# Patient Record
Sex: Female | Born: 1970 | Hispanic: No | Smoking: Never smoker
Health system: Southern US, Community
[De-identification: ages and names within clinical notes are randomized; demographics above are authoritative.]

## PROBLEM LIST (undated history)

## (undated) DIAGNOSIS — R42 Dizziness and giddiness: Secondary | ICD-10-CM

## (undated) DIAGNOSIS — E079 Disorder of thyroid, unspecified: Secondary | ICD-10-CM

## (undated) DIAGNOSIS — T7840XA Allergy, unspecified, initial encounter: Secondary | ICD-10-CM

## (undated) DIAGNOSIS — F32A Depression, unspecified: Secondary | ICD-10-CM

## (undated) DIAGNOSIS — K219 Gastro-esophageal reflux disease without esophagitis: Secondary | ICD-10-CM

## (undated) DIAGNOSIS — D649 Anemia, unspecified: Secondary | ICD-10-CM

## (undated) DIAGNOSIS — D259 Leiomyoma of uterus, unspecified: Secondary | ICD-10-CM

## (undated) DIAGNOSIS — E785 Hyperlipidemia, unspecified: Secondary | ICD-10-CM

## (undated) DIAGNOSIS — F419 Anxiety disorder, unspecified: Secondary | ICD-10-CM

## (undated) DIAGNOSIS — M1990 Unspecified osteoarthritis, unspecified site: Secondary | ICD-10-CM

## (undated) HISTORY — DX: Allergy, unspecified, initial encounter: T78.40XA

## (undated) HISTORY — DX: Gastro-esophageal reflux disease without esophagitis: K21.9

## (undated) HISTORY — DX: Anemia, unspecified: D64.9

## (undated) HISTORY — DX: Hyperlipidemia, unspecified: E78.5

## (undated) HISTORY — DX: Anxiety disorder, unspecified: F41.9

## (undated) HISTORY — DX: Dizziness and giddiness: R42

## (undated) HISTORY — PX: CHOLECYSTECTOMY: SHX55

## (undated) HISTORY — DX: Depression, unspecified: F32.A

## (undated) HISTORY — DX: Unspecified osteoarthritis, unspecified site: M19.90

---

## 2013-03-16 ENCOUNTER — Ambulatory Visit: Payer: Medicaid Other | Attending: Internal Medicine | Admitting: Internal Medicine

## 2013-03-16 ENCOUNTER — Encounter: Payer: Self-pay | Admitting: Internal Medicine

## 2013-03-16 VITALS — BP 122/70 | HR 89 | Temp 98.7°F | Resp 14 | Ht 60.0 in | Wt 158.2 lb

## 2013-03-16 DIAGNOSIS — Z0000 Encounter for general adult medical examination without abnormal findings: Secondary | ICD-10-CM

## 2013-03-16 DIAGNOSIS — E039 Hypothyroidism, unspecified: Secondary | ICD-10-CM

## 2013-03-16 LAB — CBC WITH DIFFERENTIAL/PLATELET
Basophils Absolute: 0.1 10*3/uL (ref 0.0–0.1)
Basophils Relative: 1 % (ref 0–1)
EOS ABS: 0.2 10*3/uL (ref 0.0–0.7)
Eosinophils Relative: 3 % (ref 0–5)
HCT: 39.4 % (ref 36.0–46.0)
Hemoglobin: 13.1 g/dL (ref 12.0–15.0)
Lymphocytes Relative: 33 % (ref 12–46)
Lymphs Abs: 1.9 10*3/uL (ref 0.7–4.0)
MCH: 28.2 pg (ref 26.0–34.0)
MCHC: 33.2 g/dL (ref 30.0–36.0)
MCV: 84.9 fL (ref 78.0–100.0)
Monocytes Absolute: 0.3 10*3/uL (ref 0.1–1.0)
Monocytes Relative: 5 % (ref 3–12)
NEUTROS ABS: 3.4 10*3/uL (ref 1.7–7.7)
NEUTROS PCT: 58 % (ref 43–77)
Platelets: 337 10*3/uL (ref 150–400)
RBC: 4.64 MIL/uL (ref 3.87–5.11)
RDW: 14.3 % (ref 11.5–15.5)
WBC: 5.9 10*3/uL (ref 4.0–10.5)

## 2013-03-16 LAB — COMPLETE METABOLIC PANEL WITH GFR
ALBUMIN: 4.2 g/dL (ref 3.5–5.2)
ALT: 13 U/L (ref 0–35)
AST: 14 U/L (ref 0–37)
Alkaline Phosphatase: 98 U/L (ref 39–117)
BUN: 13 mg/dL (ref 6–23)
CO2: 26 meq/L (ref 19–32)
Calcium: 9.7 mg/dL (ref 8.4–10.5)
Chloride: 104 mEq/L (ref 96–112)
Creat: 0.72 mg/dL (ref 0.50–1.10)
GLUCOSE: 75 mg/dL (ref 70–99)
POTASSIUM: 4.4 meq/L (ref 3.5–5.3)
SODIUM: 140 meq/L (ref 135–145)
TOTAL PROTEIN: 7.5 g/dL (ref 6.0–8.3)
Total Bilirubin: 0.2 mg/dL (ref 0.2–1.2)

## 2013-03-16 LAB — LIPID PANEL
Cholesterol: 207 mg/dL — ABNORMAL HIGH (ref 0–200)
HDL: 43 mg/dL (ref >39)
LDL Cholesterol: 122 mg/dL — ABNORMAL HIGH (ref 0–99)
TRIGLYCERIDES: 209 mg/dL — AB (ref <150)
Total CHOL/HDL Ratio: 4.8 Ratio
VLDL: 42 mg/dL — ABNORMAL HIGH (ref 0–40)

## 2013-03-16 MED ORDER — LEVOTHYROXINE SODIUM 100 MCG PO TABS: 100.0000 ug | ORAL_TABLET | Freq: Every day | ORAL | Status: DC

## 2013-03-16 NOTE — Progress Notes (Signed)
Patient ID: Kaitlyn Wise, female   DOB: May 10, 1970, 43 y.o.   MRN: [id number]   CC:  HPI: 43 year old female here to establish care. The patient recently moved from [place] 1-1/2 months ago. The patient has a history of hypothyroidism, was on Synthroid 100 mcg. She is requesting a prescription for the same She has a history of heavy menstrual periods and possibly has fibroids. She's never had a Pap smear She's never had a mammogram She status post cholecystectomy Social history nonsmoker nonalcoholic  Family history positive for hypertension in most family members   Not on File History reviewed. No pertinent past medical history. No current outpatient prescriptions on file prior to visit.   No current facility-administered medications on file prior to visit.   Family History  Problem Relation Age of Onset  . Diabetes Mother   . Hypertension Mother   . Diabetes Father   . Hypertension Father   . Heart disease Brother    History   Social History  . Marital Status: Single    Spouse Name: N/A    Number of Children: N/A  . Years of Education: N/A   Occupational History  . Not on file.   Social History Main Topics  . Smoking status: Never Smoker   . Smokeless tobacco: Not on file  . Alcohol Use: No  . Drug Use: No  . Sexual Activity: Not on file   Other Topics Concern  . Not on file   Social History Narrative  . No narrative on file    Review of Systems  Constitutional: Negative for fever, chills, diaphoresis, activity change, appetite change and fatigue.  HENT: Negative for ear pain, nosebleeds, congestion, facial swelling, rhinorrhea, neck pain, neck stiffness and ear discharge.   Eyes: Negative for pain, discharge, redness, itching and visual disturbance.  Respiratory: Negative for cough, choking, chest tightness, shortness of breath, wheezing and stridor.   Cardiovascular: Negative for chest pain, palpitations and leg swelling.  Gastrointestinal: Negative for  abdominal distention.  Genitourinary: Negative for dysuria, urgency, frequency, hematuria, flank pain, decreased urine volume, difficulty urinating and dyspareunia.  Musculoskeletal: Negative for back pain, joint swelling, arthralgias and gait problem.  Neurological: Negative for dizziness, tremors, seizures, syncope, facial asymmetry, speech difficulty, weakness, light-headedness, numbness and headaches.  Hematological: Negative for adenopathy. Does not bruise/bleed easily.  Psychiatric/Behavioral: Negative for hallucinations, behavioral problems, confusion, dysphoric mood, decreased concentration and agitation.    Objective:   Filed Vitals:   03/16/13 0938  BP: 122/70  Pulse: 89  Temp: 98.7 F (37.1 C)  Resp: 14    Physical Exam  Constitutional: Appears well-developed and well-nourished. No distress.  HENT: Normocephalic. External right and left ear normal. Oropharynx is clear and moist.  Eyes: Conjunctivae and EOM are normal. PERRLA, no scleral icterus.  Neck: Normal ROM. Neck supple. No JVD. No tracheal deviation. No thyromegaly.  CVS: RRR, S1/S2 +, no murmurs, no gallops, no carotid bruit.  Pulmonary: Effort and breath sounds normal, no stridor, rhonchi, wheezes, rales.  Abdominal: Soft. BS +,  no distension, tenderness, rebound or guarding.  Musculoskeletal: Normal range of motion. No edema and no tenderness.  Lymphadenopathy: No lymphadenopathy noted, cervical, inguinal. Neuro: Alert. Normal reflexes, muscle tone coordination. No cranial nerve deficit. Skin: Skin is warm and dry. No rash noted. Not diaphoretic. No erythema. No pallor.  Psychiatric: Normal mood and affect. Behavior, judgment, thought content normal.   No results found for this basename: WBC, HGB, HCT, MCV, PLT   No results found for  this basename: CREATININE, BUN, NA, K, CL, CO2    No results found for this basename: HGBA1C   Lipid Panel  No results found for this basename: chol, trig, hdl, cholhdl,  vldl, ldlcalc       Assessment and plan:   There are no active problems to display for this patient.  Hypothyroidism Will prescribe Synthroid 100 mcg TSH Free T4   Establish care Schedule the patient for a mammogram Baseline labs Gynecology referral for Pap smear and heavy menstrual bleeding Vaccination today Transvaginal ultrasound for heavy menstrual periods  The patient will follow up in 3 months      The patient was given clear instructions to go to ER or return to medical center if symptoms don't improve, worsen or new problems develop. The patient verbalized understanding. The patient was told to call to get any lab results if not heard anything in the next week.

## 2013-03-16 NOTE — Progress Notes (Signed)
Patient is here to establish care and a physical. Complains of lower back x5 years on and off. Pain occurs mostly from standing for a long period of time and reaching for items in high places. No pain today. No OTC medications. Patient is using the interpreter line. Patient needs a refill for medication and requests blood work to test thyroid level.

## 2013-03-17 LAB — T4, FREE: Free T4: 1.23 ng/dL (ref 0.80–1.80)

## 2013-03-17 LAB — TSH: TSH: 3.715 u[IU]/mL (ref 0.350–4.500)

## 2013-03-19 ENCOUNTER — Ambulatory Visit (HOSPITAL_COMMUNITY)
Admission: RE | Admit: 2013-03-19 | Discharge: 2013-03-19 | Disposition: A | Discharge status: Home / Self Care | Payer: Medicaid Other | Source: Ambulatory Visit | Attending: Internal Medicine | Admitting: Internal Medicine

## 2013-03-19 DIAGNOSIS — Z0000 Encounter for general adult medical examination without abnormal findings: Secondary | ICD-10-CM

## 2013-03-19 DIAGNOSIS — D251 Intramural leiomyoma of uterus: Secondary | ICD-10-CM | POA: Insufficient documentation

## 2013-03-19 DIAGNOSIS — N920 Excessive and frequent menstruation with regular cycle: Secondary | ICD-10-CM | POA: Insufficient documentation

## 2013-03-19 DIAGNOSIS — E039 Hypothyroidism, unspecified: Secondary | ICD-10-CM

## 2013-03-20 ENCOUNTER — Telehealth: Payer: Self-pay | Admitting: *Deleted

## 2013-03-20 NOTE — Telephone Encounter (Signed)
Contacted patient with an interpreter. Telephone number given is for a refugee case worker. Left a voicemail for patient to return our call.

## 2013-03-20 NOTE — Telephone Encounter (Signed)
Message copied by Dorethy Tomey, [place] R on Fri Mar 20, 2013  2:19 PM ------      Message from: Allyson Sabal MD, Ascencion Dike      Created: Fri Mar 20, 2013  9:59 AM       Notify patient of the labs are normal except triglycerides are 209 LDL 122, recommend low-fat diet and exercise ------

## 2013-03-31 ENCOUNTER — Other Ambulatory Visit: Payer: Self-pay | Admitting: Internal Medicine

## 2013-03-31 DIAGNOSIS — Z1231 Encounter for screening mammogram for malignant neoplasm of breast: Secondary | ICD-10-CM

## 2013-04-23 ENCOUNTER — Encounter: Payer: Medicaid Other | Admitting: Obstetrics & Gynecology

## 2013-05-17 ENCOUNTER — Encounter (HOSPITAL_COMMUNITY): Payer: Self-pay | Admitting: Emergency Medicine

## 2013-05-17 ENCOUNTER — Emergency Department (HOSPITAL_COMMUNITY)
Admission: EM | Admit: 2013-05-17 | Discharge: 2013-05-18 | Disposition: A | Discharge status: Home / Self Care | Payer: Medicaid Other | Attending: Emergency Medicine | Admitting: Emergency Medicine

## 2013-05-17 DIAGNOSIS — Z3202 Encounter for pregnancy test, result negative: Secondary | ICD-10-CM | POA: Insufficient documentation

## 2013-05-17 DIAGNOSIS — Z8742 Personal history of other diseases of the female genital tract: Secondary | ICD-10-CM | POA: Insufficient documentation

## 2013-05-17 DIAGNOSIS — R51 Headache: Secondary | ICD-10-CM | POA: Insufficient documentation

## 2013-05-17 DIAGNOSIS — R6883 Chills (without fever): Secondary | ICD-10-CM | POA: Insufficient documentation

## 2013-05-17 DIAGNOSIS — R42 Dizziness and giddiness: Secondary | ICD-10-CM | POA: Insufficient documentation

## 2013-05-17 DIAGNOSIS — T50A15A Adverse effect of pertussis vaccine, including combinations with a pertussis component, initial encounter: Secondary | ICD-10-CM | POA: Insufficient documentation

## 2013-05-17 DIAGNOSIS — Z79899 Other long term (current) drug therapy: Secondary | ICD-10-CM | POA: Insufficient documentation

## 2013-05-17 DIAGNOSIS — E039 Hypothyroidism, unspecified: Secondary | ICD-10-CM

## 2013-05-17 DIAGNOSIS — M25569 Pain in unspecified knee: Secondary | ICD-10-CM | POA: Insufficient documentation

## 2013-05-17 DIAGNOSIS — T50Z95A Adverse effect of other vaccines and biological substances, initial encounter: Secondary | ICD-10-CM

## 2013-05-17 DIAGNOSIS — R110 Nausea: Secondary | ICD-10-CM | POA: Insufficient documentation

## 2013-05-17 HISTORY — DX: Leiomyoma of uterus, unspecified: D25.9

## 2013-05-17 HISTORY — DX: Disorder of thyroid, unspecified: E07.9

## 2013-05-17 LAB — URINALYSIS, ROUTINE W REFLEX MICROSCOPIC
Bilirubin Urine: NEGATIVE
GLUCOSE, UA: NEGATIVE mg/dL
KETONES UR: 15 mg/dL — AB
Leukocytes, UA: NEGATIVE
Nitrite: NEGATIVE
PROTEIN: NEGATIVE mg/dL
Specific Gravity, Urine: 1.027 (ref 1.005–1.030)
UROBILINOGEN UA: 1 mg/dL (ref 0.0–1.0)
pH: 6 (ref 5.0–8.0)

## 2013-05-17 LAB — CBC WITH DIFFERENTIAL/PLATELET
BASOS PCT: 1 % (ref 0–1)
Basophils Absolute: 0.1 10*3/uL (ref 0.0–0.1)
EOS ABS: 0.4 10*3/uL (ref 0.0–0.7)
EOS PCT: 6 % — AB (ref 0–5)
HCT: 38.5 % (ref 36.0–46.0)
Hemoglobin: 12.9 g/dL (ref 12.0–15.0)
LYMPHS ABS: 2.8 10*3/uL (ref 0.7–4.0)
Lymphocytes Relative: 45 % (ref 12–46)
MCH: 28.7 pg (ref 26.0–34.0)
MCHC: 33.5 g/dL (ref 30.0–36.0)
MCV: 85.7 fL (ref 78.0–100.0)
Monocytes Absolute: 0.3 10*3/uL (ref 0.1–1.0)
Monocytes Relative: 5 % (ref 3–12)
Neutro Abs: 2.7 10*3/uL (ref 1.7–7.7)
Neutrophils Relative %: 43 % (ref 43–77)
PLATELETS: 256 10*3/uL (ref 150–400)
RBC: 4.49 MIL/uL (ref 3.87–5.11)
RDW: 14 % (ref 11.5–15.5)
WBC: 6.2 10*3/uL (ref 4.0–10.5)

## 2013-05-17 LAB — COMPREHENSIVE METABOLIC PANEL
ALT: 12 U/L (ref 0–35)
AST: 18 U/L (ref 0–37)
Albumin: 3.6 g/dL (ref 3.5–5.2)
Alkaline Phosphatase: 111 U/L (ref 39–117)
BUN: 18 mg/dL (ref 6–23)
CO2: 22 meq/L (ref 19–32)
CREATININE: 0.82 mg/dL (ref 0.50–1.10)
Calcium: 9.2 mg/dL (ref 8.4–10.5)
Chloride: 106 mEq/L (ref 96–112)
GFR calc Af Amer: >90 mL/min (ref >90)
GFR, EST NON AFRICAN AMERICAN: 88 mL/min — AB (ref >90)
Glucose, Bld: 91 mg/dL (ref 70–99)
Potassium: 3.4 mEq/L — ABNORMAL LOW (ref 3.7–5.3)
SODIUM: 142 meq/L (ref 137–147)
Total Bilirubin: <0.2 mg/dL — ABNORMAL LOW (ref 0.3–1.2)
Total Protein: 7.9 g/dL (ref 6.0–8.3)

## 2013-05-17 LAB — SEDIMENTATION RATE: SED RATE: 33 mm/h — AB (ref 0–22)

## 2013-05-17 LAB — URINE MICROSCOPIC-ADD ON

## 2013-05-17 LAB — POC URINE PREG, ED: Preg Test, Ur: NEGATIVE

## 2013-05-17 LAB — TSH: TSH: 5.25 u[IU]/mL — ABNORMAL HIGH (ref 0.350–4.500)

## 2013-05-17 MED ORDER — IBUPROFEN 400 MG PO TABS
400.0000 mg | ORAL_TABLET | Freq: Once | ORAL | Status: AC
  Administered 2013-05-17: 400 mg via ORAL
  Filled 2013-05-17: qty 1

## 2013-05-17 MED ORDER — SODIUM CHLORIDE 0.9 % IV BOLUS (SEPSIS)
1000.0000 mL | Freq: Once | INTRAVENOUS | Status: AC
  Administered 2013-05-17: 1000 mL via INTRAVENOUS

## 2013-05-17 MED ORDER — IBUPROFEN 400 MG PO TABS: 400.0000 mg | ORAL_TABLET | Freq: Four times a day (QID) | ORAL | Status: DC | PRN

## 2013-05-17 NOTE — Discharge Instructions (Signed)
/  /     MMR      VIRUS  .   VIRUS  (MEE zuhlz ruhs VAHY muhmps VAHY ruhs    VAHY ruhs  VAK lahyv)      ( )    ( ) .          12                           .      Marland Kitchen          Marland Kitchen     (S): M-M-R II                        :  -bleeding -cancer            -immune      -low               -seizure -taking          -             -pregnant       -breast          .       .         .       .       .          Marland Kitchen           12        .  :                    . :      .      .         ()   .       .               .               : -adalimumab -anakinra -etanercept -infliximab -medicines     -medicines          :  -immune   -live         .                   .             .        .              .    3     .                  .          .            .                           :   -allergic               -breathing -changes   -changes      -extreme -fast       -fever 100   -pain       -seizures    -unusual  -unusually           (          ): -aches   -bruising       -  diarrhea -headache -low   100     -nausea  -runny   -sleepy  -swollen         .         Marland Kitchen           [phone].                 . :    .       .                 .  2014  /  . (2007-07-21 13:57:06)

## 2013-05-17 NOTE — ED Notes (Signed)
Pt family and patient states that she has been complaining of generalized weakness for several days.

## 2013-05-17 NOTE — ED Notes (Signed)
Pt reports that she has been feeling dizzy for some time worsening now that she had vaccines. Pt appears anxious. Pt reassured vitals are stable and working on getting her a bed.

## 2013-05-17 NOTE — ED Notes (Signed)
Pt ambulated to bathroom 

## 2013-05-17 NOTE — ED Provider Notes (Addendum)
CSN: [id number]     Arrival date & time 05/17/13  1811 History   First MD Initiated Contact with Patient 05/17/13 2119     Chief Complaint  Patient presents with  . Generalized Body Aches     (Consider location/radiation/quality/duration/timing/severity/associated sxs/prior Treatment) HPI Comments: Patient presents with complaints of generalized only use, chills, headache, intermittent nausea and lightheadedness upon standing has been ongoing now for the last 5 days since receiving vaccines at the [facility]. She received hepatitis B, MMR, tetanus, pertussis, influenza. She denies vomiting, diarrhea, abdominal pain, cough, shortness of breath, chest pain, rash. She has some mild pain in her right knee and also started on Wednesday but denies falls, swelling, redness or warmth. She denies any focal weakness.  Her only prior medical history is hypothyroidism and she's been taking Synthroid 100 mcg regularly. She recently relocated to the [place] from [place] as a refugee and has been living here for 4 months.  The history is provided by the patient. The history is limited by a language barrier. A language interpreter was used.    Past Medical History  Diagnosis Date  . Thyroid disease   . Uterine fibroid    Past Surgical History  Procedure Laterality Date  . Cholecystectomy     Family History  Problem Relation Age of Onset  . Diabetes Mother   . Hypertension Mother   . Diabetes Father   . Hypertension Father   . Heart disease Brother    History  Substance Use Topics  . Smoking status: Never Smoker   . Smokeless tobacco: Not on file  . Alcohol Use: No   OB History   Grav Para Term Preterm Abortions TAB SAB Ect Mult Living                 Review of Systems  All other systems reviewed and are negative.     Allergies  Review of patient's allergies indicates no known allergies.  Home Medications   Prior to Admission medications   Medication Sig Start Date End  Date Taking? Authorizing Provider  levothyroxine (SYNTHROID, LEVOTHROID) 100 MCG tablet Take 100 mcg by mouth daily before breakfast.    Historical Provider, MD  levothyroxine (SYNTHROID, LEVOTHROID) 100 MCG tablet Take 1 tablet (100 mcg total) by mouth daily. 03/16/13   Reyne Dumas, MD   BP 124/77  Pulse 83  Temp(Src) 97.9 F (36.6 C) (Oral)  Resp 18  SpO2 100%  LMP 05/14/2013 Physical Exam  Nursing note and vitals reviewed. Constitutional: She is oriented to person, place, and time. She appears well-developed and well-nourished. No distress.  HENT:  Head: Normocephalic and atraumatic.  Right Ear: Tympanic membrane normal.  Left Ear: Tympanic membrane normal.  Mouth/Throat: Oropharynx is clear and moist.  Eyes: Conjunctivae and EOM are normal. Pupils are equal, round, and reactive to light.  No nystagmus  Neck: Normal range of motion. Neck supple.  Cardiovascular: Normal rate, regular rhythm and intact distal pulses.   No murmur heard. Pulmonary/Chest: Effort normal and breath sounds normal. No respiratory distress. She has no wheezes. She has no rales.  Abdominal: Soft. She exhibits no distension. There is no tenderness. There is no rebound and no guarding.  Musculoskeletal: Normal range of motion. She exhibits no edema and no tenderness.  Neurological: She is alert and oriented to person, place, and time. She has normal strength. No sensory deficit.  Reflex Scores:      Patellar reflexes are 2+ on the right side and  2+ on the left side.      Achilles reflexes are 2+ on the right side and 2+ on the left side. Able to ambulate without ataxia or difficulty  Skin: Skin is warm and dry. No rash noted. No erythema.  Psychiatric: She has a normal mood and affect. Her behavior is normal.    ED Course  Procedures (including critical care time) Labs Review Labs Reviewed  CBC WITH DIFFERENTIAL - Abnormal; Notable for the following:    Eosinophils Relative 6 (*)    All other  components within normal limits  URINALYSIS, ROUTINE W REFLEX MICROSCOPIC - Abnormal; Notable for the following:    APPearance TURBID (*)    Hgb urine dipstick SMALL (*)    Ketones, ur 15 (*)    All other components within normal limits  TSH - Abnormal; Notable for the following:    TSH 5.250 (*)    All other components within normal limits  SEDIMENTATION RATE - Abnormal; Notable for the following:    Sed Rate 33 (*)    All other components within normal limits  COMPREHENSIVE METABOLIC PANEL - Abnormal; Notable for the following:    Potassium 3.4 (*)    Total Bilirubin <0.2 (*)    GFR calc non Af Amer 88 (*)    All other components within normal limits  URINE MICROSCOPIC-ADD ON - Abnormal; Notable for the following:    Squamous Epithelial / LPF MANY (*)    All other components within normal limits  T4, FREE  POC URINE PREG, ED    Imaging Review No results found.   EKG Interpretation None      MDM   Final diagnoses:  Adverse reaction to vaccine  Hypothyroidism    Patient with a significant history of hypothyroidism who is currently taking Synthroid and recently relocated to the [place] as a refugee from [place] who is seen at the health department 5 days ago and received multiple vaccines including MMR, hepatitis B, tetanus and pertussis presents today with symptoms since that time with chills, headache, feelings of lightheadedness upon standing and intermittent nausea. There have been no documented fevers and she denies abdominal pain, neck pain or vision changes. She denies any leg weakness.  Patient has a normal exam without focal findings. She has 2+ deep tendon reflexes in her legs with minimal concern for Guillain-Barr.  Normal neurologic exam without signs of nystagmus. With standing the patient had mild drop in blood pressure but normal heart rate.  Feel most likely patient's symptoms are related to the recent vaccines. She was given ibuprofen and IV fluids.  Urine pregnancy is negative and UA is negative for acute infectious etiology. Hemoglobin is stable at 12.9. CMP within normal limits. Patient's TSH is elevated today at 5.25 and then is taking 100 mcg of Synthroid.  Sedimentation rate is mildly elevated at 33 which could be a result of menses versus recent vaccines. Patient does have an established physician here and recommended that she followup who are adjustment of her Synthroid and further workup of sedimentation rate if patient's symptoms continue beyond the next one week.   Blanchie Dessert, MD 05/17/13 [place], MD 05/17/13 1093

## 2013-05-17 NOTE — ED Notes (Signed)
Mask in place.

## 2013-05-17 NOTE — ED Notes (Signed)
Pt is arabic speaking, no english. Using translator lines reports she has been feeling bad for 10 days, her legs have been hurting, feeling dizzy. At night she has nightmares and wakes up with fever she reports. On Wednesday she received 4 vaccines from the health department because she moved here 3 months ago from [place]. She believes the vaccine have worsened her condition. Also h/a for 10 days. Pt does not want blood drawn because last time she passed out. Told we need for testing, refused until she is in bed.

## 2013-05-18 LAB — T4, FREE: Free T4: 1.38 ng/dL (ref 0.80–1.80)

## 2013-05-20 ENCOUNTER — Telehealth: Payer: Self-pay | Admitting: Internal Medicine

## 2013-05-20 NOTE — Telephone Encounter (Signed)
Pt having reaction to thyroid medication, feels dizzy, change in heart rate and disruption in menstrual cycle.  [employer] case worker Joycelyn Schmid calling to know if pt can come in for walkin? Please f/u with Joycelyn Schmid, she will coordinate appt/info with pt.

## 2013-05-21 ENCOUNTER — Ambulatory Visit: Payer: Medicaid Other | Attending: Internal Medicine | Admitting: Internal Medicine

## 2013-05-21 ENCOUNTER — Telehealth: Payer: Self-pay

## 2013-05-21 ENCOUNTER — Encounter: Payer: Self-pay | Admitting: Internal Medicine

## 2013-05-21 VITALS — BP 122/84 | HR 92 | Temp 98.4°F | Resp 16 | Ht 62.0 in | Wt 155.0 lb

## 2013-05-21 DIAGNOSIS — D259 Leiomyoma of uterus, unspecified: Secondary | ICD-10-CM | POA: Insufficient documentation

## 2013-05-21 DIAGNOSIS — R42 Dizziness and giddiness: Secondary | ICD-10-CM | POA: Insufficient documentation

## 2013-05-21 DIAGNOSIS — E039 Hypothyroidism, unspecified: Secondary | ICD-10-CM

## 2013-05-21 DIAGNOSIS — Z79899 Other long term (current) drug therapy: Secondary | ICD-10-CM | POA: Insufficient documentation

## 2013-05-21 MED ORDER — LEVOTHYROXINE SODIUM 75 MCG PO TABS: 75.0000 ug | ORAL_TABLET | Freq: Every day | ORAL | Status: DC

## 2013-05-21 NOTE — Telephone Encounter (Signed)
Caseworker calling again, says she has not received call regarding pt issue.

## 2013-05-21 NOTE — Addendum Note (Signed)
Addended by: Candie Chroman D on: 05/21/2013 04:22 PM   Modules accepted: Orders, Medications

## 2013-05-21 NOTE — Telephone Encounter (Signed)
Spoke with case worker We will see the patient this afternoon for her dizziness and change In heart rate

## 2013-05-21 NOTE — Patient Instructions (Signed)

## 2013-05-21 NOTE — Progress Notes (Signed)
Pt here with c/o reaction to Levothyroxine x 2 mnths. Pt states she was taking medication x 5 yrs in the past and never experienced s/e C/o hair loss,irregular menstruals with prolonged bleeding and freq headaches Arabic interpretor used per [employer]

## 2013-05-21 NOTE — Progress Notes (Signed)
Patient ID: Kaitlyn Wise, female   DOB: Jan 26, 1970, 43 y.o.   MRN: [id number]  CC: dizziness  HPI: 43 year old female with past medical history of thyroid disease who had regular followup and was found to have TSH slightly above the normal limit. Her Synthroid was increased from 50 mcg to 100 mcg daily. She took it for one time and she started experiencing dizziness, shaking and palpitations. She stopped taking the medication and her symptoms resolved.  No Known Allergies Past Medical History  Diagnosis Date  . Thyroid disease   . Uterine fibroid    Current Outpatient Prescriptions on File Prior to Visit  Medication Sig Dispense Refill  . levothyroxine (SYNTHROID, LEVOTHROID) 100 MCG tablet Take 100 mcg by mouth daily before breakfast.      . ibuprofen (ADVIL,MOTRIN) 400 MG tablet Take 1 tablet (400 mg total) by mouth every 6 (six) hours as needed.  15 tablet  0   No current facility-administered medications on file prior to visit.   Family History  Problem Relation Age of Onset  . Diabetes Mother   . Hypertension Mother   . Diabetes Father   . Hypertension Father   . Heart disease Brother    History   Social History  . Marital Status: Widowed    Spouse Name: N/A    Number of Children: N/A  . Years of Education: N/A   Occupational History  . Not on file.   Social History Main Topics  . Smoking status: Never Smoker   . Smokeless tobacco: Not on file  . Alcohol Use: No  . Drug Use: No  . Sexual Activity: Not on file   Other Topics Concern  . Not on file   Social History Narrative  . No narrative on file    Review of Systems  Constitutional: Negative for fever, chills, diaphoresis, activity change, appetite change and fatigue.  HENT: Negative for ear pain, nosebleeds, congestion, facial swelling, rhinorrhea, neck pain, neck stiffness and ear discharge.   Eyes: Negative for pain, discharge, redness, itching and visual disturbance.  Respiratory: Negative for cough,  choking, chest tightness, shortness of breath, wheezing and stridor.   Cardiovascular: Negative for chest pain, palpitations and leg swelling.  Gastrointestinal: Negative for abdominal distention.  Genitourinary: Negative for dysuria, urgency, frequency, hematuria, flank pain, decreased urine volume, difficulty urinating and dyspareunia.  Musculoskeletal: Negative for back pain, joint swelling, arthralgias and gait problem.  Neurological: per HPI  Hematological: Negative for adenopathy. Does not bruise/bleed easily.  Psychiatric/Behavioral: Negative for hallucinations, behavioral problems, confusion, dysphoric mood, decreased concentration and agitation.    Objective:   Filed Vitals:   05/21/13 1539  BP: 122/84  Pulse: 92  Temp: 98.4 F (36.9 C)  Resp: 16    Physical Exam  Constitutional: Appears well-developed and well-nourished. No distress.  HENT: Normocephalic. External right and left ear normal. Oropharynx is clear and moist.  Eyes: Conjunctivae and EOM are normal. PERRLA, no scleral icterus.  Neck: Normal ROM. Neck supple. No JVD. No tracheal deviation. No thyromegaly.  CVS: RRR, S1/S2 +, no murmurs, no gallops, no carotid bruit.  Pulmonary: Effort and breath sounds normal, no stridor, rhonchi, wheezes, rales.  Abdominal: Soft. BS +,  no distension, tenderness, rebound or guarding.  Musculoskeletal: Normal range of motion. No edema and no tenderness.  Lymphadenopathy: No lymphadenopathy noted, cervical, inguinal. Neuro: Alert. Normal reflexes, muscle tone coordination. No cranial nerve deficit. Skin: Skin is warm and dry. No rash noted. Not diaphoretic. No erythema. No pallor.  Psychiatric: Normal mood and affect. Behavior, judgment, thought content normal.   Lab Results  Component Value Date   WBC 6.2 05/17/2013   HGB 12.9 05/17/2013   HCT 38.5 05/17/2013   MCV 85.7 05/17/2013   PLT 256 05/17/2013   Lab Results  Component Value Date   CREATININE 0.82 05/17/2013   BUN 18  05/17/2013   NA 142 05/17/2013   K 3.4* 05/17/2013   CL 106 05/17/2013   CO2 22 05/17/2013    No results found for this basename: HGBA1C   Lipid Panel     Component Value Date/Time   CHOL 207* 03/16/2013 1013   TRIG 209* 03/16/2013 1013   HDL 43 03/16/2013 1013   CHOLHDL 4.8 03/16/2013 1013   VLDL 42* 03/16/2013 1013   LDLCALC 122* 03/16/2013 1013       Assessment and plan:   Patient Active Problem List   Diagnosis Date Noted  . Unspecified hypothyroidism 05/21/2013    Priority: Medium - pt given 100 mc synthroid for slightly elevated TSH level; she is on 50 mcg daily and this was increased to 100 mcg daily. Patient started experiencing dizziness, shaking. I think this is too high of a dose of will change the dosing to 75 mcg daily. Patient was instructed to call us to let us know how she is feeling.

## 2013-05-25 ENCOUNTER — Ambulatory Visit: Payer: Medicaid Other | Attending: Internal Medicine

## 2013-05-25 MED ORDER — LEVOTHYROXINE SODIUM 75 MCG PO TABS: 75.0000 ug | ORAL_TABLET | Freq: Every day | ORAL | Status: DC

## 2013-05-25 NOTE — Addendum Note (Signed)
Addended by: Robbie Lis on: 05/25/2013 11:03 AM   Modules accepted: Orders

## 2013-06-12 ENCOUNTER — Telehealth: Payer: Self-pay | Admitting: Emergency Medicine

## 2013-06-12 NOTE — Telephone Encounter (Signed)
Number listed disconnected. Attempting to call pt to reschedule appt 06/16/13

## 2013-06-16 ENCOUNTER — Ambulatory Visit: Payer: Medicaid Other | Attending: Internal Medicine | Admitting: Internal Medicine

## 2013-06-16 ENCOUNTER — Encounter: Payer: Self-pay | Admitting: Internal Medicine

## 2013-06-16 VITALS — BP 110/77 | HR 74 | Temp 98.8°F | Resp 16 | Ht 62.0 in | Wt 157.0 lb

## 2013-06-16 DIAGNOSIS — E785 Hyperlipidemia, unspecified: Secondary | ICD-10-CM | POA: Insufficient documentation

## 2013-06-16 DIAGNOSIS — Z139 Encounter for screening, unspecified: Secondary | ICD-10-CM

## 2013-06-16 DIAGNOSIS — E039 Hypothyroidism, unspecified: Secondary | ICD-10-CM | POA: Insufficient documentation

## 2013-06-16 MED ORDER — LEVOTHYROXINE SODIUM 75 MCG PO TABS: 75.0000 ug | ORAL_TABLET | Freq: Every day | ORAL | Status: DC

## 2013-06-16 NOTE — Patient Instructions (Signed)
Fat and Cholesterol Control Diet  Fat and cholesterol levels in your blood and organs are influenced by your diet. High levels of fat and cholesterol may lead to diseases of the heart, small and large blood vessels, gallbladder, liver, and pancreas.  CONTROLLING FAT AND CHOLESTEROL WITH DIET  Although exercise and lifestyle factors are important, your diet is key. That is because certain foods are known to raise cholesterol and others to lower it. The goal is to balance foods for their effect on cholesterol and more importantly, to replace saturated and trans fat with other types of fat, such as monounsaturated fat, polyunsaturated fat, and omega-3 fatty acids.  On average, a person should consume no more than 15 to 17 g of saturated fat daily. Saturated and trans fats are considered "bad" fats, and they will raise LDL cholesterol. Saturated fats are primarily found in animal products such as meats, butter, and cream. However, that does not mean you need to give up all your favorite foods. Today, there are good tasting, low-fat, low-cholesterol substitutes for most of the things you like to eat. Choose low-fat or nonfat alternatives. Choose round or loin cuts of red meat. These types of cuts are lowest in fat and cholesterol. Chicken (without the skin), fish, veal, and ground turkey breast are great choices. Eliminate fatty meats, such as hot dogs and salami. Even shellfish have little or no saturated fat. Have a 3 oz (85 g) portion when you eat lean meat, poultry, or fish.  Trans fats are also called "partially hydrogenated oils." They are oils that have been scientifically manipulated so that they are solid at room temperature resulting in a longer shelf life and improved taste and texture of foods in which they are added. Trans fats are found in stick margarine, some tub margarines, cookies, crackers, and baked goods.   When baking and cooking, oils are a great substitute for butter. The monounsaturated oils are  especially beneficial since it is believed they lower LDL and raise HDL. The oils you should avoid entirely are saturated tropical oils, such as coconut and palm.   Remember to eat a lot from food groups that are naturally free of saturated and trans fat, including fish, fruit, vegetables, beans, grains (barley, rice, couscous, bulgur wheat), and pasta (without cream sauces).   IDENTIFYING FOODS THAT LOWER FAT AND CHOLESTEROL   Soluble fiber may lower your cholesterol. This type of fiber is found in fruits such as apples, vegetables such as broccoli, potatoes, and carrots, legumes such as beans, peas, and lentils, and grains such as barley. Foods fortified with plant sterols (phytosterol) may also lower cholesterol. You should eat at least 2 g per day of these foods for a cholesterol lowering effect.   Read package labels to identify low-saturated fats, trans fat free, and low-fat foods at the supermarket. Select cheeses that have only 2 to 3 g saturated fat per ounce. Use a heart-healthy tub margarine that is free of trans fats or partially hydrogenated oil. When buying baked goods (cookies, crackers), avoid partially hydrogenated oils. Breads and muffins should be made from whole grains (whole-wheat or whole oat flour, instead of "flour" or "enriched flour"). Buy non-creamy canned soups with reduced salt and no added fats.   FOOD PREPARATION TECHNIQUES   Never deep-fry. If you must fry, either stir-fry, which uses very little fat, or use non-stick cooking sprays. When possible, broil, bake, or roast meats, and steam vegetables. Instead of putting butter or margarine on vegetables, use lemon   and herbs, applesauce, and cinnamon (for squash and sweet potatoes). Use nonfat yogurt, salsa, and low-fat dressings for salads.   LOW-SATURATED FAT / LOW-FAT FOOD SUBSTITUTES  Meats / Saturated Fat (g)  · Avoid: Steak, marbled (3 oz/85 g) / 11 g  · Choose: Steak, lean (3 oz/85 g) / 4 g  · Avoid: Hamburger (3 oz/85 g) / 7  g  · Choose: Hamburger, lean (3 oz/85 g) / 5 g  · Avoid: Ham (3 oz/85 g) / 6 g  · Choose: Ham, lean cut (3 oz/85 g) / 2.4 g  · Avoid: Chicken, with skin, dark meat (3 oz/85 g) / 4 g  · Choose: Chicken, skin removed, dark meat (3 oz/85 g) / 2 g  · Avoid: Chicken, with skin, light meat (3 oz/85 g) / 2.5 g  · Choose: Chicken, skin removed, light meat (3 oz/85 g) / 1 g  Dairy / Saturated Fat (g)  · Avoid: Whole milk (1 cup) / 5 g  · Choose: Low-fat milk, 2% (1 cup) / 3 g  · Choose: Low-fat milk, 1% (1 cup) / 1.5 g  · Choose: Skim milk (1 cup) / 0.3 g  · Avoid: Hard cheese (1 oz/28 g) / 6 g  · Choose: Skim milk cheese (1 oz/28 g) / 2 to 3 g  · Avoid: Cottage cheese, 4% fat (1 cup) / 6.5 g  · Choose: Low-fat cottage cheese, 1% fat (1 cup) / 1.5 g  · Avoid: Ice cream (1 cup) / 9 g  · Choose: Sherbet (1 cup) / 2.5 g  · Choose: Nonfat frozen yogurt (1 cup) / 0.3 g  · Choose: Frozen fruit bar / trace  · Avoid: Whipped cream (1 tbs) / 3.5 g  · Choose: Nondairy whipped topping (1 tbs) / 1 g  Condiments / Saturated Fat (g)  · Avoid: Mayonnaise (1 tbs) / 2 g  · Choose: Low-fat mayonnaise (1 tbs) / 1 g  · Avoid: Butter (1 tbs) / 7 g  · Choose: Extra light margarine (1 tbs) / 1 g  · Avoid: Coconut oil (1 tbs) / 11.8 g  · Choose: Olive oil (1 tbs) / 1.8 g  · Choose: Corn oil (1 tbs) / 1.7 g  · Choose: Safflower oil (1 tbs) / 1.2 g  · Choose: Sunflower oil (1 tbs) / 1.4 g  · Choose: Soybean oil (1 tbs) / 2.4 g  · Choose: Canola oil (1 tbs) / 1 g  Document Released: 01/01/2005 Document Revised: 04/28/2012 Document Reviewed: 06/22/2010  [employer]® Patient Information ©2014 [employer].

## 2013-06-16 NOTE — Progress Notes (Signed)
Pt is here following up on her hypothyroidism. Pt states that she is here to have her thyroid checked. Pt states that she is still having occasional back pain. Today she has an interpreter.

## 2013-06-16 NOTE — Progress Notes (Signed)
MRN: [id number] Name: Kaitlyn Wise  Sex: female Age: 43 y.o. DOB: February 02, 1970  Allergies: Review of patient's allergies indicates no known allergies.  Chief Complaint  Patient presents with  . Follow-up    HPI: Patient is 43 y.o. female who  History of hypothyroidism dyslipidemia comes today for followup, her levothyroxine dose was reduced to 75 mcg on the last visit, patient reports improvement in her symptoms, denies any headache dizziness chest and shortness of breath, patient has not had a mammogram done.   Past Medical History  Diagnosis Date  . Thyroid disease   . Uterine fibroid     Past Surgical History  Procedure Laterality Date  . Cholecystectomy        Medication List       This list is accurate as of: 06/16/13 11:06 AM.  Always use your most recent med list.               ibuprofen 400 MG tablet  Commonly known as:  ADVIL,MOTRIN  Take 1 tablet (400 mg total) by mouth every 6 (six) hours as needed.     levothyroxine 75 MCG tablet  Commonly known as:  SYNTHROID, LEVOTHROID  Take 1 tablet (75 mcg total) by mouth daily.        Meds ordered this encounter  Medications  . levothyroxine (SYNTHROID, LEVOTHROID) 75 MCG tablet    Sig: Take 1 tablet (75 mcg total) by mouth daily.    Dispense:  30 tablet    Refill:  3    There is no immunization history for the selected administration types on file for this patient.  Family History  Problem Relation Age of Onset  . Diabetes Mother   . Hypertension Mother   . Diabetes Father   . Hypertension Father   . Heart disease Brother     History  Substance Use Topics  . Smoking status: Never Smoker   . Smokeless tobacco: Not on file  . Alcohol Use: No    Review of Systems   As noted in HPI  Filed Vitals:   06/16/13 1004  BP: 110/77  Pulse: 74  Temp: 98.8 F (37.1 C)  Resp: 16    Physical Exam  Physical Exam  Constitutional: No distress.  Eyes: EOM are normal. Pupils are equal, round,  and reactive to light.  Cardiovascular: Normal rate and regular rhythm.   Pulmonary/Chest: Breath sounds normal. No respiratory distress. She has no wheezes. She has no rales.  Musculoskeletal: She exhibits no edema.  Neurological: She has normal reflexes.    CBC    Component Value Date/Time   WBC 6.2 05/17/2013 2050   RBC 4.49 05/17/2013 2050   HGB 12.9 05/17/2013 2050   HCT 38.5 05/17/2013 2050   PLT 256 05/17/2013 2050   MCV 85.7 05/17/2013 2050   LYMPHSABS 2.8 05/17/2013 2050   MONOABS 0.3 05/17/2013 2050   EOSABS 0.4 05/17/2013 2050   BASOSABS 0.1 05/17/2013 2050    CMP     Component Value Date/Time   NA 142 05/17/2013 2050   K 3.4* 05/17/2013 2050   CL 106 05/17/2013 2050   CO2 22 05/17/2013 2050   GLUCOSE 91 05/17/2013 2050   BUN 18 05/17/2013 2050   CREATININE 0.82 05/17/2013 2050   CREATININE 0.72 03/16/2013 1013   CALCIUM 9.2 05/17/2013 2050   PROT 7.9 05/17/2013 2050   ALBUMIN 3.6 05/17/2013 2050   AST 18 05/17/2013 2050   ALT 12 05/17/2013 2050   ALKPHOS  111 05/17/2013 2050   BILITOT <0.2* 05/17/2013 2050   GFRNONAA 88* 05/17/2013 2050   GFRNONAA >89 03/16/2013 1013   GFRAA >90 05/17/2013 2050   GFRAA >89 03/16/2013 1013    Lab Results  Component Value Date/Time   CHOL 207* 03/16/2013 10:13 AM    No components found with this basename: hga1c    Lab Results  Component Value Date/Time   AST 18 05/17/2013  8:50 PM    Assessment and Plan  Unspecified hypothyroidism - Plan: levothyroxine (SYNTHROID, LEVOTHROID) 75 MCG tablet  Screening - Plan: MM DIGITAL SCREENING BILATERAL  Dyslipidemia   Health Maintenance  -Mammogram: ordered    Return in about 3 months (around 09/16/2013) for hypothyroid.  Lorayne Marek, MD

## 2013-09-16 ENCOUNTER — Ambulatory Visit: Payer: Self-pay | Attending: Internal Medicine | Admitting: Internal Medicine

## 2013-09-16 ENCOUNTER — Encounter: Payer: Self-pay | Admitting: Internal Medicine

## 2013-09-16 VITALS — BP 100/68 | HR 74 | Temp 97.8°F | Resp 16 | Wt 142.6 lb

## 2013-09-16 DIAGNOSIS — E785 Hyperlipidemia, unspecified: Secondary | ICD-10-CM | POA: Insufficient documentation

## 2013-09-16 DIAGNOSIS — Z139 Encounter for screening, unspecified: Secondary | ICD-10-CM

## 2013-09-16 DIAGNOSIS — R0602 Shortness of breath: Secondary | ICD-10-CM | POA: Insufficient documentation

## 2013-09-16 DIAGNOSIS — Z79899 Other long term (current) drug therapy: Secondary | ICD-10-CM | POA: Insufficient documentation

## 2013-09-16 DIAGNOSIS — E039 Hypothyroidism, unspecified: Secondary | ICD-10-CM | POA: Insufficient documentation

## 2013-09-16 LAB — TSH: TSH: 4.725 u[IU]/mL — ABNORMAL HIGH (ref 0.350–4.500)

## 2013-09-16 LAB — LIPID PANEL
Cholesterol: 200 mg/dL (ref 0–200)
HDL: 51 mg/dL (ref >39)
LDL CALC: 126 mg/dL — AB (ref 0–99)
TRIGLYCERIDES: 113 mg/dL (ref <150)
Total CHOL/HDL Ratio: 3.9 Ratio
VLDL: 23 mg/dL (ref 0–40)

## 2013-09-16 NOTE — Progress Notes (Signed)
Patient here with interpreter Here for follow up on her thyroid disease

## 2013-09-16 NOTE — Progress Notes (Signed)
MRN: [id number] Name: Kaitlyn Wise  Sex: female Age: 43 y.o. DOB: 1970-05-05  Allergies: Review of patient's allergies indicates no known allergies.  Chief Complaint  Patient presents with  . Follow-up    HPI: Patient is 43 y.o. female who has to of hypothyroidism, hyperlipidemia comes today for followup, denies any acute symptoms denies any headache dizziness chest and shortness of breath, currently taking levothyroxine 75 mcg daily. Patient has modified her diet and lost more than 10 pounds compared to last visit.   Past Medical History  Diagnosis Date  . Thyroid disease   . Uterine fibroid     Past Surgical History  Procedure Laterality Date  . Cholecystectomy        Medication List       This list is accurate as of: 09/16/13 10:41 AM.  Always use your most recent med list.               ibuprofen 400 MG tablet  Commonly known as:  ADVIL,MOTRIN  Take 1 tablet (400 mg total) by mouth every 6 (six) hours as needed.     levothyroxine 75 MCG tablet  Commonly known as:  SYNTHROID, LEVOTHROID  Take 1 tablet (75 mcg total) by mouth daily.        No orders of the defined types were placed in this encounter.    There is no immunization history for the selected administration types on file for this patient.  Family History  Problem Relation Age of Onset  . Diabetes Mother   . Hypertension Mother   . Diabetes Father   . Hypertension Father   . Heart disease Brother     History  Substance Use Topics  . Smoking status: Never Smoker   . Smokeless tobacco: Not on file  . Alcohol Use: No    Review of Systems   As noted in HPI  Filed Vitals:   09/16/13 1026  BP: 100/68  Pulse: 74  Temp: 97.8 F (36.6 C)  Resp: 16    Physical Exam  Physical Exam  Constitutional: No distress.  Eyes: EOM are normal. Pupils are equal, round, and reactive to light.  Cardiovascular: Normal rate and regular rhythm.   Pulmonary/Chest: Breath sounds normal. No  respiratory distress. She has no wheezes. She has no rales.    CBC    Component Value Date/Time   WBC 6.2 05/17/2013 2050   RBC 4.49 05/17/2013 2050   HGB 12.9 05/17/2013 2050   HCT 38.5 05/17/2013 2050   PLT 256 05/17/2013 2050   MCV 85.7 05/17/2013 2050   LYMPHSABS 2.8 05/17/2013 2050   MONOABS 0.3 05/17/2013 2050   EOSABS 0.4 05/17/2013 2050   BASOSABS 0.1 05/17/2013 2050    CMP     Component Value Date/Time   NA 142 05/17/2013 2050   K 3.4* 05/17/2013 2050   CL 106 05/17/2013 2050   CO2 22 05/17/2013 2050   GLUCOSE 91 05/17/2013 2050   BUN 18 05/17/2013 2050   CREATININE 0.82 05/17/2013 2050   CREATININE 0.72 03/16/2013 1013   CALCIUM 9.2 05/17/2013 2050   PROT 7.9 05/17/2013 2050   ALBUMIN 3.6 05/17/2013 2050   AST 18 05/17/2013 2050   ALT 12 05/17/2013 2050   ALKPHOS 111 05/17/2013 2050   BILITOT <0.2* 05/17/2013 2050   GFRNONAA 88* 05/17/2013 2050   GFRNONAA >89 03/16/2013 1013   GFRAA >90 05/17/2013 2050   GFRAA >89 03/16/2013 1013    Lab Results  Component Value  Date/Time   CHOL 207* 03/16/2013 10:13 AM    No components found with this basename: hga1c    Lab Results  Component Value Date/Time   AST 18 05/17/2013  8:50 PM    Assessment and Plan  Unspecified hypothyroidism - Plan: Currently patient is on levothyroxine 75 mcg daily, will repeat her TSH  Dyslipidemia - Plan: Advised continue with low-fat diet, repeat her Lipid panel  Screening - Plan: MM DIGITAL SCREENING BILATERAL   Health Maintenance  -Mammogram: Ordered    Return in about 3 months (around 12/16/2013) for hypothyroid, hyperipidemia.  Lorayne Marek, MD

## 2013-10-01 ENCOUNTER — Telehealth: Payer: Self-pay

## 2013-10-01 NOTE — Telephone Encounter (Signed)
Interpreter line used Patient not available Message left on voice mail to return our call 

## 2013-10-01 NOTE — Telephone Encounter (Signed)
Message copied by Dorothe Pea on Thu Oct 01, 2013  2:50 PM ------      Message from: Lorayne Marek      Created: Thu Sep 17, 2013  9:32 AM       Blood work reviewed, TSH level is still abnormal, currently patient is taking levothyroxine 75 mcg, advise patient to increase the dose to levothyroxine 88 mcg daily, will repeat her TSH level on the following visit. ------

## 2013-10-12 ENCOUNTER — Other Ambulatory Visit: Payer: Self-pay

## 2013-10-12 DIAGNOSIS — E039 Hypothyroidism, unspecified: Secondary | ICD-10-CM

## 2013-10-12 MED ORDER — LEVOTHYROXINE SODIUM 75 MCG PO TABS: 75.0000 ug | ORAL_TABLET | Freq: Every day | ORAL | Status: DC

## 2013-10-15 ENCOUNTER — Ambulatory Visit: Payer: Medicaid Other

## 2013-10-26 ENCOUNTER — Other Ambulatory Visit: Payer: Self-pay | Admitting: Internal Medicine

## 2013-11-03 ENCOUNTER — Other Ambulatory Visit: Payer: Self-pay | Admitting: Internal Medicine

## 2013-11-12 ENCOUNTER — Other Ambulatory Visit: Payer: Self-pay | Admitting: Emergency Medicine

## 2013-11-12 DIAGNOSIS — E039 Hypothyroidism, unspecified: Secondary | ICD-10-CM

## 2013-11-12 MED ORDER — LEVOTHYROXINE SODIUM 88 MCG PO TABS: 88.0000 ug | ORAL_TABLET | Freq: Every day | ORAL | Status: DC

## 2013-12-23 ENCOUNTER — Encounter: Payer: Self-pay | Admitting: Internal Medicine

## 2013-12-23 ENCOUNTER — Ambulatory Visit (HOSPITAL_BASED_OUTPATIENT_CLINIC_OR_DEPARTMENT_OTHER): Payer: Self-pay

## 2013-12-23 ENCOUNTER — Ambulatory Visit: Payer: Medicaid Other | Attending: Internal Medicine

## 2013-12-23 ENCOUNTER — Ambulatory Visit: Payer: Self-pay | Attending: Internal Medicine | Admitting: Internal Medicine

## 2013-12-23 VITALS — BP 109/64 | HR 73 | Temp 98.0°F | Resp 16 | Wt 140.0 lb

## 2013-12-23 DIAGNOSIS — Z1231 Encounter for screening mammogram for malignant neoplasm of breast: Secondary | ICD-10-CM

## 2013-12-23 DIAGNOSIS — Z23 Encounter for immunization: Secondary | ICD-10-CM

## 2013-12-23 DIAGNOSIS — E039 Hypothyroidism, unspecified: Secondary | ICD-10-CM | POA: Insufficient documentation

## 2013-12-23 DIAGNOSIS — E785 Hyperlipidemia, unspecified: Secondary | ICD-10-CM | POA: Insufficient documentation

## 2013-12-23 LAB — TSH: TSH: 5.694 u[IU]/mL — ABNORMAL HIGH (ref 0.350–4.500)

## 2013-12-23 NOTE — Progress Notes (Signed)
MRN: [id number] Name: Kaitlyn Wise  Sex: female Age: 43 y.o. DOB: 1970-07-27  Allergies: Review of patient's allergies indicates no known allergies.  Chief Complaint  Patient presents with  . Follow-up    thyroid disease    HPI: Patient is 43 y.o. female who has history of hypothyroidism, hyperlipidemia, on the last visit her levothyroxine dose was increased, needs to repeat her TSH level, currently denies any acute symptoms denies any headache dizziness chest and shortness of breath, her cholesterol level was also improved.  patient will like to get a flu shot today.  Past Medical History  Diagnosis Date  . Thyroid disease   . Uterine fibroid     Past Surgical History  Procedure Laterality Date  . Cholecystectomy        Medication List       This list is accurate as of: 12/23/13  2:24 PM.  Always use your most recent med list.               ibuprofen 400 MG tablet  Commonly known as:  ADVIL,MOTRIN  Take 1 tablet (400 mg total) by mouth every 6 (six) hours as needed.     levothyroxine 88 MCG tablet  Commonly known as:  SYNTHROID, LEVOTHROID  Take 1 tablet (88 mcg total) by mouth daily.        No orders of the defined types were placed in this encounter.    There is no immunization history for the selected administration types on file for this patient.  Family History  Problem Relation Age of Onset  . Diabetes Mother   . Hypertension Mother   . Diabetes Father   . Hypertension Father   . Heart disease Brother     History  Substance Use Topics  . Smoking status: Never Smoker   . Smokeless tobacco: Not on file  . Alcohol Use: No    Review of Systems   As noted in HPI  Filed Vitals:   12/23/13 1406  BP: 109/64  Pulse: 73  Temp: 98 F (36.7 C)  Resp: 16    Physical Exam  Physical Exam  Constitutional: No distress.  Eyes: EOM are normal. Pupils are equal, round, and reactive to light.  Cardiovascular: Normal rate.     Pulmonary/Chest: Breath sounds normal. No respiratory distress. She has no wheezes. She has no rales.  Musculoskeletal: She exhibits no edema.    CBC    Component Value Date/Time   WBC 6.2 05/17/2013 2050   RBC 4.49 05/17/2013 2050   HGB 12.9 05/17/2013 2050   HCT 38.5 05/17/2013 2050   PLT 256 05/17/2013 2050   MCV 85.7 05/17/2013 2050   LYMPHSABS 2.8 05/17/2013 2050   MONOABS 0.3 05/17/2013 2050   EOSABS 0.4 05/17/2013 2050   BASOSABS 0.1 05/17/2013 2050    CMP     Component Value Date/Time   NA 142 05/17/2013 2050   K 3.4* 05/17/2013 2050   CL 106 05/17/2013 2050   CO2 22 05/17/2013 2050   GLUCOSE 91 05/17/2013 2050   BUN 18 05/17/2013 2050   CREATININE 0.82 05/17/2013 2050   CREATININE 0.72 03/16/2013 1013   CALCIUM 9.2 05/17/2013 2050   PROT 7.9 05/17/2013 2050   ALBUMIN 3.6 05/17/2013 2050   AST 18 05/17/2013 2050   ALT 12 05/17/2013 2050   ALKPHOS 111 05/17/2013 2050   BILITOT <0.2* 05/17/2013 2050   GFRNONAA 88* 05/17/2013 2050   GFRNONAA >89 03/16/2013 1013   GFRAA >90  05/17/2013 2050   GFRAA >89 03/16/2013 1013    Lab Results  Component Value Date/Time   CHOL 200 09/16/2013 10:50 AM    No components found for: HGA1C  Lab Results  Component Value Date/Time   AST 18 05/17/2013 08:50 PM    Assessment and Plan  Hypothyroidism, unspecified hypothyroidism type - Plan: currently patient is on levothyroxine 88 mcg daily, will repeat her TSH level  Encounter for screening mammogram for breast cancer - Plan: MM DIGITAL SCREENING BILATERAL  Needs flu shot Flu shot given today.  Health Maintenance  -Mammogram: ordered  -Vaccinations:  -flu shot today   Return in about 3 months (around 03/24/2014) for hypothyroid.  Lorayne Marek, MD

## 2013-12-23 NOTE — Progress Notes (Signed)
Patient here with interpreter Patient here for follow up on her thyroid disease

## 2014-01-05 ENCOUNTER — Telehealth: Payer: Self-pay | Admitting: *Deleted

## 2014-01-05 DIAGNOSIS — E039 Hypothyroidism, unspecified: Secondary | ICD-10-CM

## 2014-01-05 MED ORDER — LEVOTHYROXINE SODIUM 100 MCG PO TABS: 100.0000 ug | ORAL_TABLET | Freq: Every day | ORAL | Status: DC

## 2014-01-05 NOTE — Telephone Encounter (Signed)
-----   Message from Lorayne Marek, MD sent at 12/24/2013 11:38 AM EST ----- Call and let the patient know that her TSH level is still abnormal, advise patient to increase the dose of levothyroxine to 100 mcg daily, will repeat the level in 3 months.

## 2014-01-05 NOTE — Telephone Encounter (Signed)
Pt aware of lab results, notified medicine changes Rx Levothyroxine 100 mcg e-scribe to [facility] Used [employer] Interpreted # [phone]

## 2014-01-18 ENCOUNTER — Ambulatory Visit: Payer: Self-pay | Attending: Internal Medicine | Admitting: Internal Medicine

## 2014-01-18 ENCOUNTER — Encounter: Payer: Self-pay | Admitting: Internal Medicine

## 2014-01-18 VITALS — BP 114/73 | HR 79 | Temp 98.0°F | Resp 16 | Wt 140.4 lb

## 2014-01-18 DIAGNOSIS — L299 Pruritus, unspecified: Secondary | ICD-10-CM

## 2014-01-18 DIAGNOSIS — R21 Rash and other nonspecific skin eruption: Secondary | ICD-10-CM

## 2014-01-18 DIAGNOSIS — Z79899 Other long term (current) drug therapy: Secondary | ICD-10-CM | POA: Insufficient documentation

## 2014-01-18 DIAGNOSIS — E079 Disorder of thyroid, unspecified: Secondary | ICD-10-CM | POA: Insufficient documentation

## 2014-01-18 MED ORDER — HYDROCORTISONE 2.5 % EX CREA: TOPICAL_CREAM | Freq: Two times a day (BID) | CUTANEOUS | Status: DC

## 2014-01-18 MED ORDER — METHYLPREDNISOLONE 4 MG PO KIT: PACK | ORAL | Status: DC

## 2014-01-18 MED ORDER — CETIRIZINE HCL 10 MG PO TABS: 10.0000 mg | ORAL_TABLET | Freq: Every day | ORAL | Status: DC

## 2014-01-18 NOTE — Progress Notes (Signed)
Patient here with interpreter Complains of back itching for the past year Patient states she is only using dial soap Not using any OTC remedies

## 2014-01-18 NOTE — Progress Notes (Signed)
MRN: [id number] Name: Kaitlyn Wise  Sex: female Age: 44 y.o. DOB: 02/10/1970  Allergies: Review of patient's allergies indicates no known allergies.  Chief Complaint  Patient presents with  . Rash    HPI: Patient is 44 y.o. female who  Comes today reported to have noticed rash on her back on and off for several months, she denies any recent change in soap detergent any medications or food, the itching is all day, she has not tried any over-the-counter medications, denies any fever chills sore throat shortness of breath or wheezing.  Past Medical History  Diagnosis Date  . Thyroid disease   . Uterine fibroid     Past Surgical History  Procedure Laterality Date  . Cholecystectomy        Medication List       This list is accurate as of: 01/18/14 10:38 AM.  Always use your most recent med list.               cetirizine 10 MG tablet  Commonly known as:  ZYRTEC  Take 1 tablet (10 mg total) by mouth daily.     hydrocortisone 2.5 % cream  Apply topically 2 (two) times daily.     ibuprofen 400 MG tablet  Commonly known as:  ADVIL,MOTRIN  Take 1 tablet (400 mg total) by mouth every 6 (six) hours as needed.     levothyroxine 88 MCG tablet  Commonly known as:  SYNTHROID, LEVOTHROID  Take 1 tablet (88 mcg total) by mouth daily.     levothyroxine 100 MCG tablet  Commonly known as:  SYNTHROID, LEVOTHROID  Take 1 tablet (100 mcg total) by mouth daily.     methylPREDNISolone 4 MG tablet  Commonly known as:  MEDROL DOSEPAK  follow package directions        Meds ordered this encounter  Medications  . hydrocortisone 2.5 % cream    Sig: Apply topically 2 (two) times daily.    Dispense:  30 g    Refill:  0  . cetirizine (ZYRTEC) 10 MG tablet    Sig: Take 1 tablet (10 mg total) by mouth daily.    Dispense:  30 tablet    Refill:  3  . methylPREDNISolone (MEDROL DOSEPAK) 4 MG tablet    Sig: follow package directions    Dispense:  21 tablet    Refill:  0     Immunization History  Administered Date(s) Administered  . Influenza,inj,Quad PF,36+ Mos 12/23/2013    Family History  Problem Relation Age of Onset  . Diabetes Mother   . Hypertension Mother   . Diabetes Father   . Hypertension Father   . Heart disease Brother     History  Substance Use Topics  . Smoking status: Never Smoker   . Smokeless tobacco: Not on file  . Alcohol Use: No    Review of Systems   As noted in HPI  Filed Vitals:   01/18/14 1005  BP: 114/73  Pulse: 79  Temp: 98 F (36.7 C)  Resp: 16    Physical Exam  Physical Exam  Cardiovascular: Normal rate and regular rhythm.   Pulmonary/Chest: Breath sounds normal. No respiratory distress. She has no wheezes. She has no rales.  Skin:  Rash noticed on the upper back, no open sores or any discharge.    CBC    Component Value Date/Time   WBC 6.2 05/17/2013 2050   RBC 4.49 05/17/2013 2050   HGB 12.9 05/17/2013 2050  HCT 38.5 05/17/2013 2050   PLT 256 05/17/2013 2050   MCV 85.7 05/17/2013 2050   LYMPHSABS 2.8 05/17/2013 2050   MONOABS 0.3 05/17/2013 2050   EOSABS 0.4 05/17/2013 2050   BASOSABS 0.1 05/17/2013 2050    CMP     Component Value Date/Time   NA 142 05/17/2013 2050   K 3.4* 05/17/2013 2050   CL 106 05/17/2013 2050   CO2 22 05/17/2013 2050   GLUCOSE 91 05/17/2013 2050   BUN 18 05/17/2013 2050   CREATININE 0.82 05/17/2013 2050   CREATININE 0.72 03/16/2013 1013   CALCIUM 9.2 05/17/2013 2050   PROT 7.9 05/17/2013 2050   ALBUMIN 3.6 05/17/2013 2050   AST 18 05/17/2013 2050   ALT 12 05/17/2013 2050   ALKPHOS 111 05/17/2013 2050   BILITOT <0.2* 05/17/2013 2050   GFRNONAA 88* 05/17/2013 2050   GFRNONAA >89 03/16/2013 1013   GFRAA >90 05/17/2013 2050   GFRAA >89 03/16/2013 1013    Lab Results  Component Value Date/Time   CHOL 200 09/16/2013 10:50 AM    No components found for: HGA1C  Lab Results  Component Value Date/Time   AST 18 05/17/2013 08:50 PM    Assessment  and Plan  Rash and nonspecific skin eruption - Plan: hydrocortisone 2.5 % cream, methylPREDNISolone (MEDROL DOSEPAK) 4 MG tablet  Itching - Plan: cetirizine (ZYRTEC) 10 MG tablet  Patient is also advised to use fragrance free soap  if symptomatically not improved consider referral to dermatology.   Return in about 3 months (around 04/19/2014), or if symptoms worsen or fail to improve.  Lorayne Marek, MD

## 2014-06-30 ENCOUNTER — Telehealth: Payer: Self-pay | Admitting: Internal Medicine

## 2014-06-30 NOTE — Telephone Encounter (Signed)
Pt has been taking thyroid medication and it has caused vaginal bleeding, pt says this has happened before.  Pt states bleeding started on 05/22/14. Pt was advised to go to [facility] per nurse instructions.

## 2014-07-01 ENCOUNTER — Encounter (HOSPITAL_COMMUNITY): Payer: Self-pay | Admitting: Emergency Medicine

## 2014-07-01 ENCOUNTER — Emergency Department (INDEPENDENT_AMBULATORY_CARE_PROVIDER_SITE_OTHER)
Admission: EM | Admit: 2014-07-01 | Discharge: 2014-07-01 | Disposition: A | Discharge status: Home / Self Care | Payer: Self-pay | Source: Home / Self Care | Attending: Family Medicine | Admitting: Family Medicine

## 2014-07-01 DIAGNOSIS — N939 Abnormal uterine and vaginal bleeding, unspecified: Secondary | ICD-10-CM

## 2014-07-01 DIAGNOSIS — E039 Hypothyroidism, unspecified: Secondary | ICD-10-CM

## 2014-07-01 MED ORDER — NORGESTIMATE-ETH ESTRADIOL 0.25-35 MG-MCG PO TABS: 1.0000 | ORAL_TABLET | Freq: Every day | ORAL | Status: DC

## 2014-07-01 NOTE — ED Notes (Signed)
Pt states that she has had bleeding on her menstrual since she started a new medication Synthyroid 100 mcg which is a dose increase she was taking 88mcg she states that she has been off medication for 10 days

## 2014-07-01 NOTE — Discharge Instructions (Signed)
Abnormal Uterine Bleeding Abnormal uterine bleeding means bleeding from the vagina that is not your normal menstrual period. This can be:  Bleeding or spotting between periods.  Bleeding after sex (sexual intercourse).  Bleeding that is heavier or more than normal.  Periods that last longer than usual.  Bleeding after menopause. There are many problems that may cause this. Treatment will depend on the cause of the bleeding. Any kind of bleeding that is not normal should be reviewed by your doctor.  HOME CARE Watch your condition for any changes. These actions may lessen any discomfort you are having:  Do not use tampons or douches as told by your doctor.  Change your pads often. You should get regular pelvic exams and Pap tests. Keep all appointments for tests as told by your doctor. GET HELP IF:  You are bleeding for more than 1 week.  You feel dizzy at times. GET HELP RIGHT AWAY IF:   You pass out.  You have to change pads every 15 to 30 minutes.  You have belly pain.  You have a fever.  You become sweaty or weak.  You are passing large blood clots from the vagina.  You feel sick to your stomach (nauseous) and throw up (vomit). MAKE SURE YOU:  Understand these instructions.  Will watch your condition.  Will get help right away if you are not doing well or get worse. Document Released: 10/29/2008 Document Revised: 01/06/2013 Document Reviewed: 07/31/2012 [facility] Patient Information 2015 [place], [place]. This information is not intended to replace advice given to you by your health care provider. Make sure you discuss any questions you have with your health care provider.  Hypothyroidism Restart your Synthroid 21mcg daily. The thyroid is a large gland located in the lower front of your neck. The thyroid gland helps control metabolism. Metabolism is how your body handles food. It controls metabolism with the hormone thyroxine. When this gland is underactive  (hypothyroid), it produces too little hormone.  CAUSES These include:   Absence or destruction of thyroid tissue.  Goiter due to iodine deficiency.  Goiter due to medications.  Congenital defects (since birth).  Problems with the pituitary. This causes a lack of TSH (thyroid stimulating hormone). This hormone tells the thyroid to turn out more hormone. SYMPTOMS  Lethargy (feeling as though you have no energy)  Cold intolerance  Weight gain (in spite of normal food intake)  Dry skin  Coarse hair  Menstrual irregularity (if severe, may lead to infertility)  Slowing of thought processes Cardiac problems are also caused by insufficient amounts of thyroid hormone. Hypothyroidism in the newborn is cretinism, and is an extreme form. It is important that this form be treated adequately and immediately or it will lead rapidly to retarded physical and mental development. DIAGNOSIS  To prove hypothyroidism, your caregiver may do blood tests and ultrasound tests. Sometimes the signs are hidden. It may be necessary for your caregiver to watch this illness with blood tests either before or after diagnosis and treatment. TREATMENT  Low levels of thyroid hormone are increased by using synthetic thyroid hormone. This is a safe, effective treatment. It usually takes about four weeks to gain the full effects of the medication. After you have the full effect of the medication, it will generally take another four weeks for problems to leave. Your caregiver may start you on low doses. If you have had heart problems the dose may be gradually increased. It is generally not an emergency to get rapidly  to normal. HOME CARE INSTRUCTIONS   Take your medications as your caregiver suggests. Let your caregiver know of any medications you are taking or start taking. Your caregiver will help you with dosage schedules.  As your condition improves, your dosage needs may increase. It will be necessary to have  continuing blood tests as suggested by your caregiver.  Report all suspected medication side effects to your caregiver. SEEK MEDICAL CARE IF: Seek medical care if you develop:  Sweating.  Tremulousness (tremors).  Anxiety.  Rapid weight loss.  Heat intolerance.  Emotional swings.  Diarrhea.  Weakness. SEEK IMMEDIATE MEDICAL CARE IF:  You develop chest pain, an irregular heart beat (palpitations), or a rapid heart beat. MAKE SURE YOU:   Understand these instructions.  Will watch your condition.  Will get help right away if you are not doing well or get worse. Document Released: 01/01/2005 Document Revised: 03/26/2011 Document Reviewed: 08/22/2007 [facility] Patient Information 2015 [place], [place]. This information is not intended to replace advice given to you by your health care provider. Make sure you discuss any questions you have with your health care provider.

## 2014-07-01 NOTE — ED Provider Notes (Signed)
CSN: [id number]     Arrival date & time 07/01/14  1317 History   First MD Initiated Contact with Patient 07/01/14 1522     Chief Complaint  Patient presents with  . Menstrual Problem   (Consider location/radiation/quality/duration/timing/severity/associated sxs/prior Treatment) HPI Comments: 44 year old Muslim female has been having vaginal bleeding nearly every day since 05/22/2014. This occurs at the same time in which her physician increased her Synthroid from 88 g 100 g. Interestingly just a few years ago the same thing happen when her Synthroid was increased from 75 g to 88 g. The bleeding lasted for a couple of months. She has no pelvic pain. She is not sexually active. She stopped taking the Synthroid 10 d ago.   Past Medical History  Diagnosis Date  . Thyroid disease   . Uterine fibroid    Past Surgical History  Procedure Laterality Date  . Cholecystectomy     Family History  Problem Relation Age of Onset  . Diabetes Mother   . Hypertension Mother   . Diabetes Father   . Hypertension Father   . Heart disease Brother    History  Substance Use Topics  . Smoking status: Never Smoker   . Smokeless tobacco: Not on file  . Alcohol Use: No   OB History    No data available     Review of Systems  Constitutional: Negative for fever, activity change and fatigue.  HENT: Negative.   Respiratory: Negative.   Cardiovascular: Negative.   Gastrointestinal: Negative.   Genitourinary: Positive for vaginal bleeding and menstrual problem. Negative for dysuria, frequency, vaginal discharge and pelvic pain.  Neurological: Negative.     Allergies  Review of patient's allergies indicates no known allergies.  Home Medications   Prior to Admission medications   Medication Sig Start Date End Date Taking? Authorizing Provider  levothyroxine (SYNTHROID, LEVOTHROID) 100 MCG tablet Take 1 tablet (100 mcg total) by mouth daily. 01/05/14  Yes Lorayne Marek, MD  cetirizine (ZYRTEC)  10 MG tablet Take 1 tablet (10 mg total) by mouth daily. 01/18/14   Lorayne Marek, MD  hydrocortisone 2.5 % cream Apply topically 2 (two) times daily. 01/18/14   Lorayne Marek, MD  ibuprofen (ADVIL,MOTRIN) 400 MG tablet Take 1 tablet (400 mg total) by mouth every 6 (six) hours as needed. 05/17/13   Blanchie Dessert, MD  levothyroxine (SYNTHROID, LEVOTHROID) 88 MCG tablet Take 1 tablet (88 mcg total) by mouth daily. 11/12/13   Lorayne Marek, MD  methylPREDNISolone (MEDROL DOSEPAK) 4 MG tablet follow package directions 01/18/14   Lorayne Marek, MD  norgestimate-ethinyl estradiol (ORTHO-CYCLEN,SPRINTEC,PREVIFEM) 0.25-35 MG-MCG tablet Take 1 tablet by mouth daily. 07/01/14   Janne Napoleon, NP   BP 103/65 mmHg  Pulse 64  Temp(Src) 98.4 F (36.9 C) (Oral)  Resp 20  SpO2 100% Physical Exam  Constitutional: She appears well-developed and well-nourished. No distress.  Eyes: Conjunctivae and EOM are normal.  Normal color of the conjunctiva. No pallor  Neck: Normal range of motion. Neck supple.  Cardiovascular: Normal rate, regular rhythm and normal heart sounds.   Pulmonary/Chest: Effort normal and breath sounds normal. No respiratory distress.  Musculoskeletal: Normal range of motion.  Neurological: She is alert. She exhibits normal muscle tone.  Skin: Skin is warm and dry. No rash noted. No erythema. No pallor.  Capillary refill is brisk.  Nursing note and vitals reviewed.   ED Course  Procedures (including critical care time) Labs Review Labs Reviewed - No data to display  Imaging Review No  results found.   MDM   1. Abnormal uterine bleeding (AUB)   2. Hypothyroidism, unspecified hypothyroidism type    Start Synthroid 67mcg 1 q d. Pt st has at home Start Sprintec for now. See your PCP ASAP    Janne Napoleon, NP 07/01/14 1614

## 2014-07-16 ENCOUNTER — Telehealth: Payer: Self-pay | Admitting: Internal Medicine

## 2014-07-16 NOTE — Telephone Encounter (Signed)
Per Regino Schultze, patient was sent to Urgent Care and Regino Schultze spoke with Alene Mires concerning patient.

## 2014-07-16 NOTE — Telephone Encounter (Signed)
Patient has come in today to inform PCP that Synthroid 100 is too strong and that she is experiencing more bleeding; patient is requesting a medication change back to 88; please advise patient

## 2014-07-29 ENCOUNTER — Encounter: Payer: Self-pay | Admitting: Internal Medicine

## 2014-07-29 ENCOUNTER — Ambulatory Visit: Payer: Self-pay | Attending: Internal Medicine | Admitting: Internal Medicine

## 2014-07-29 VITALS — BP 106/70 | HR 72 | Temp 98.0°F | Resp 16 | Wt 133.4 lb

## 2014-07-29 DIAGNOSIS — Z79899 Other long term (current) drug therapy: Secondary | ICD-10-CM | POA: Insufficient documentation

## 2014-07-29 DIAGNOSIS — E039 Hypothyroidism, unspecified: Secondary | ICD-10-CM | POA: Insufficient documentation

## 2014-07-29 DIAGNOSIS — N924 Excessive bleeding in the premenopausal period: Secondary | ICD-10-CM | POA: Insufficient documentation

## 2014-07-29 LAB — CBC WITH DIFFERENTIAL/PLATELET
Basophils Absolute: 0.1 10*3/uL (ref 0.0–0.1)
Basophils Relative: 1 % (ref 0–1)
EOS ABS: 0.3 10*3/uL (ref 0.0–0.7)
EOS PCT: 5 % (ref 0–5)
HEMATOCRIT: 39.3 % (ref 36.0–46.0)
Hemoglobin: 13.3 g/dL (ref 12.0–15.0)
LYMPHS ABS: 1.8 10*3/uL (ref 0.7–4.0)
Lymphocytes Relative: 36 % (ref 12–46)
MCH: 29.8 pg (ref 26.0–34.0)
MCHC: 33.8 g/dL (ref 30.0–36.0)
MCV: 87.9 fL (ref 78.0–100.0)
MONOS PCT: 3 % (ref 3–12)
MPV: 10.6 fL (ref 8.6–12.4)
Monocytes Absolute: 0.2 10*3/uL (ref 0.1–1.0)
NEUTROS PCT: 55 % (ref 43–77)
Neutro Abs: 2.8 10*3/uL (ref 1.7–7.7)
PLATELETS: 239 10*3/uL (ref 150–400)
RBC: 4.47 MIL/uL (ref 3.87–5.11)
RDW: 15.2 % (ref 11.5–15.5)
WBC: 5 10*3/uL (ref 4.0–10.5)

## 2014-07-29 MED ORDER — LEVOTHYROXINE SODIUM 88 MCG PO TABS: 88.0000 ug | ORAL_TABLET | Freq: Every day | ORAL | Status: DC

## 2014-07-29 NOTE — Progress Notes (Signed)
Patient here with interpreter Patient states she has not taken her thyroid medication in over a month Patient states when she was taking the 100 mcg she would have a menstrual cycle That lasted really long and it was very heavy Patient did go to the urgent care for her problem and was prescribed birth control pills Which she did not pick up

## 2014-07-29 NOTE — Progress Notes (Signed)
MRN: [id number] Name: Coletta Lockner  Sex: female Age: 44 y.o. DOB: 1970-11-26  Allergies: Review of patient's allergies indicates no known allergies.  Chief Complaint  Patient presents with  . Follow-up    HPI: Patient is 44 y.o. female who history of hypothyroidism, last month patient went to urgent care with symptoms of having menstrual bleeding, apparently patient has not been taking her thyroid medication, because she noticed that when her dose of thyroid medication was increased to 170mcg daily her bleeding got worse, her last menstrual period was July 2nd which lasted for 7 days and she also reports it was heavy, apparently she was prescribed birth control pills which she has not used. Patient would like to resume back on 88 mcg of thyroid medication.  Past Medical History  Diagnosis Date  . Thyroid disease   . Uterine fibroid     Past Surgical History  Procedure Laterality Date  . Cholecystectomy        Medication List       This list is accurate as of: 07/29/14 10:56 AM.  Always use your most recent med list.               cetirizine 10 MG tablet  Commonly known as:  ZYRTEC  Take 1 tablet (10 mg total) by mouth daily.     hydrocortisone 2.5 % cream  Apply topically 2 (two) times daily.     ibuprofen 400 MG tablet  Commonly known as:  ADVIL,MOTRIN  Take 1 tablet (400 mg total) by mouth every 6 (six) hours as needed.     levothyroxine 88 MCG tablet  Commonly known as:  SYNTHROID, LEVOTHROID  Take 1 tablet (88 mcg total) by mouth daily.     methylPREDNISolone 4 MG tablet  Commonly known as:  MEDROL DOSEPAK  follow package directions     norgestimate-ethinyl estradiol 0.25-35 MG-MCG tablet  Commonly known as:  ORTHO-CYCLEN,SPRINTEC,PREVIFEM  Take 1 tablet by mouth daily.        Meds ordered this encounter  Medications  . levothyroxine (SYNTHROID, LEVOTHROID) 88 MCG tablet    Sig: Take 1 tablet (88 mcg total) by mouth daily.    Dispense:  90  tablet    Refill:  3    Immunization History  Administered Date(s) Administered  . Influenza,inj,Quad PF,36+ Mos 12/23/2013    Family History  Problem Relation Age of Onset  . Diabetes Mother   . Hypertension Mother   . Diabetes Father   . Hypertension Father   . Heart disease Brother     History  Substance Use Topics  . Smoking status: Never Smoker   . Smokeless tobacco: Not on file  . Alcohol Use: No    Review of Systems   As noted in HPI  Filed Vitals:   07/29/14 1031  BP: 106/70  Pulse: 72  Temp: 98 F (36.7 C)  Resp: 16    Physical Exam  Physical Exam  Constitutional: No distress.  Eyes: EOM are normal. Pupils are equal, round, and reactive to light.  Cardiovascular: Normal rate and regular rhythm.   Pulmonary/Chest: Breath sounds normal. No respiratory distress. She has no wheezes. She has no rales.  Musculoskeletal: She exhibits no edema.    CBC    Component Value Date/Time   WBC 6.2 05/17/2013 2050   RBC 4.49 05/17/2013 2050   HGB 12.9 05/17/2013 2050   HCT 38.5 05/17/2013 2050   PLT 256 05/17/2013 2050   MCV 85.7 05/17/2013 2050  LYMPHSABS 2.8 05/17/2013 2050   MONOABS 0.3 05/17/2013 2050   EOSABS 0.4 05/17/2013 2050   BASOSABS 0.1 05/17/2013 2050    CMP     Component Value Date/Time   NA 142 05/17/2013 2050   K 3.4* 05/17/2013 2050   CL 106 05/17/2013 2050   CO2 22 05/17/2013 2050   GLUCOSE 91 05/17/2013 2050   BUN 18 05/17/2013 2050   CREATININE 0.82 05/17/2013 2050   CREATININE 0.72 03/16/2013 1013   CALCIUM 9.2 05/17/2013 2050   PROT 7.9 05/17/2013 2050   ALBUMIN 3.6 05/17/2013 2050   AST 18 05/17/2013 2050   ALT 12 05/17/2013 2050   ALKPHOS 111 05/17/2013 2050   BILITOT <0.2* 05/17/2013 2050   GFRNONAA 88* 05/17/2013 2050   GFRNONAA >89 03/16/2013 1013   GFRAA >90 05/17/2013 2050   GFRAA >89 03/16/2013 1013    Lab Results  Component Value Date/Time   CHOL 200 09/16/2013 10:50 AM    No results found for:  HGBA1C  Lab Results  Component Value Date/Time   AST 18 05/17/2013 08:50 PM    Assessment and Plan  Hypothyroidism, unspecified hypothyroidism type - Plan: resume back on levothyroxine (SYNTHROID, LEVOTHROID) 88 MCG tablet, will check TSH level on the following visit.  Excessive bleeding in premenopausal period - Plan: will check CBC with Differential/Platelet    Return in about 3 months (around 10/29/2014), or if symptoms worsen or fail to improve.   This note has been created with Surveyor, quantity. Any transcriptional errors are unintentional.    Lorayne Marek, MD

## 2014-08-03 ENCOUNTER — Telehealth: Payer: Self-pay

## 2014-08-03 NOTE — Telephone Encounter (Signed)
Patient is aware of her lab results 

## 2014-08-03 NOTE — Telephone Encounter (Signed)
-----   Message from Lorayne Marek, MD sent at 08/02/2014 10:44 AM EDT ----- Call and let the Patient know that blood work is normal.

## 2015-01-05 NOTE — Telephone Encounter (Signed)
error 

## 2015-03-08 MED FILL — ?LEVOTHYROXINE 88 MCG TAB: 88 | 30 days supply | Qty: 30 | Fill #2

## 2015-03-09 ENCOUNTER — Ambulatory Visit: Payer: Self-pay | Attending: Family Medicine | Admitting: Clinical

## 2015-03-09 ENCOUNTER — Encounter: Payer: Self-pay | Admitting: Family Medicine

## 2015-03-09 ENCOUNTER — Ambulatory Visit (HOSPITAL_BASED_OUTPATIENT_CLINIC_OR_DEPARTMENT_OTHER): Payer: Self-pay | Admitting: Family Medicine

## 2015-03-09 VITALS — BP 100/65 | HR 63 | Temp 98.2°F | Resp 15 | Wt 138.0 lb

## 2015-03-09 DIAGNOSIS — K2970 Gastritis, unspecified, without bleeding: Secondary | ICD-10-CM | POA: Insufficient documentation

## 2015-03-09 DIAGNOSIS — K2990 Gastroduodenitis, unspecified, without bleeding: Secondary | ICD-10-CM

## 2015-03-09 DIAGNOSIS — E039 Hypothyroidism, unspecified: Secondary | ICD-10-CM | POA: Insufficient documentation

## 2015-03-09 DIAGNOSIS — Z79899 Other long term (current) drug therapy: Secondary | ICD-10-CM | POA: Insufficient documentation

## 2015-03-09 DIAGNOSIS — E038 Other specified hypothyroidism: Secondary | ICD-10-CM

## 2015-03-09 DIAGNOSIS — F329 Major depressive disorder, single episode, unspecified: Secondary | ICD-10-CM

## 2015-03-09 DIAGNOSIS — F32A Depression, unspecified: Secondary | ICD-10-CM

## 2015-03-09 DIAGNOSIS — F4321 Adjustment disorder with depressed mood: Secondary | ICD-10-CM | POA: Insufficient documentation

## 2015-03-09 MED ORDER — PANTOPRAZOLE SODIUM 40 MG PO TBEC: 40.0000 mg | DELAYED_RELEASE_TABLET | Freq: Every day | ORAL | Status: DC

## 2015-03-09 MED ORDER — FLUOXETINE HCL 20 MG PO TABS: 20.0000 mg | ORAL_TABLET | Freq: Every day | ORAL | Status: DC

## 2015-03-09 MED FILL — PANTOPRAZOLE SOD DR 40 MG T: 40 | 30 days supply | Qty: 30 | Fill #0

## 2015-03-09 MED FILL — ?FLUOXETINE HCL 20MG TABLET: 20 | 30 days supply | Qty: 30 | Fill #0

## 2015-03-09 NOTE — Progress Notes (Signed)
Primary trigger for depression is knowing that [place] family and friends have been banned from entering the country.

## 2015-03-09 NOTE — Progress Notes (Signed)
Subjective:  Patient ID: Kaitlyn Wise, female    DOB: 1970-09-19  Age: 45 y.o. MRN: [id number]  CC: Depression   HPI Darren Arana is a 45 year old female with a history of hypothyroidism who was seen by the LCSW today for depression which has worsened due to not being able to see her family members as the result of the recent immigration ban. She has been crying as a result and has lost her voice, appetite has decreased and she has not been eating with resultant epigastric pain; she has no energy and no motivation but denies suicidal ideations or intents.  She had been out of her levothyroxine for 2 weeks until yesterday when she started taking it again; last TSH was elevated at 5.694 from 12/2013.  Outpatient Prescriptions Prior to Visit  Medication Sig Dispense Refill  . levothyroxine (SYNTHROID, LEVOTHROID) 88 MCG tablet Take 1 tablet (88 mcg total) by mouth daily. 90 tablet 3  . cetirizine (ZYRTEC) 10 MG tablet Take 1 tablet (10 mg total) by mouth daily. (Patient not taking: Reported on 03/09/2015) 30 tablet 3  . hydrocortisone 2.5 % cream Apply topically 2 (two) times daily. (Patient not taking: Reported on 03/09/2015) 30 g 0  . ibuprofen (ADVIL,MOTRIN) 400 MG tablet Take 1 tablet (400 mg total) by mouth every 6 (six) hours as needed. (Patient not taking: Reported on 03/09/2015) 15 tablet 0  . methylPREDNISolone (MEDROL DOSEPAK) 4 MG tablet follow package directions (Patient not taking: Reported on 03/09/2015) 21 tablet 0  . norgestimate-ethinyl estradiol (ORTHO-CYCLEN,SPRINTEC,PREVIFEM) 0.25-35 MG-MCG tablet Take 1 tablet by mouth daily. (Patient not taking: Reported on 03/09/2015) 1 Package 0   No facility-administered medications prior to visit.    ROS Review of Systems  Constitutional: Negative for activity change and appetite change.  HENT: Negative for sinus pressure and sore throat.   Respiratory: Negative for chest tightness, shortness of breath and wheezing.     Cardiovascular: Negative for chest pain and palpitations.  Gastrointestinal: Positive for abdominal pain. Negative for constipation and abdominal distention.  Genitourinary: Negative.   Musculoskeletal: Negative.   Psychiatric/Behavioral: Positive for dysphoric mood. Negative for behavioral problems.    Objective:  BP 100/65 mmHg  Pulse 63  Temp(Src) 98.2 F (36.8 C)  Resp 15  Wt 138 lb (62.596 kg)  SpO2 97%  BP/Weight 03/09/2015 07/29/2014 [id number]  Systolic BP [id number]  Diastolic BP 65 70 65  Wt. (Lbs) 138 133.4 -  BMI 25.23 24.39 -      Physical Exam  Constitutional: She is oriented to person, place, and time. She appears well-developed and well-nourished.  Cardiovascular: Normal rate, normal heart sounds and intact distal pulses.   No murmur heard. Pulmonary/Chest: Effort normal and breath sounds normal. She has no wheezes. She has no rales. She exhibits no tenderness.  Abdominal: Soft. Bowel sounds are normal. She exhibits no distension and no mass. There is tenderness (epigastric tenderness).  Musculoskeletal: Normal range of motion.  Neurological: She is alert and oriented to person, place, and time.  Psychiatric:  Patient appears sad     Assessment & Plan:   1. Other specified hypothyroidism Uncontrolled. We'll make no regimen changes given she had been out of levothyroxine for the last 2 weeks. We'll repeat TSH at next visit again - TSH  2. Depression Situational Commenced on Prozac. Continue counseling sessions with LCSW - FLUoxetine (PROZAC) 20 MG tablet; Take 1 tablet (20 mg total) by mouth daily.  Dispense: 30 tablet; Refill: 3  3. Gastritis  and gastroduodenitis Secondary to reduced oral intake Advised to eat consistently. - pantoprazole (PROTONIX) 40 MG tablet; Take 1 tablet (40 mg total) by mouth daily.  Dispense: 30 tablet; Refill: 3   Meds ordered this encounter  Medications  . FLUoxetine (PROZAC) 20 MG tablet    Sig: Take 1 tablet (20  mg total) by mouth daily.    Dispense:  30 tablet    Refill:  3  . pantoprazole (PROTONIX) 40 MG tablet    Sig: Take 1 tablet (40 mg total) by mouth daily.    Dispense:  30 tablet    Refill:  3    Follow-up: Return in about 1 month (around 04/06/2015) for for follow up of Depression and Gatritis.   Arnoldo Morale MD

## 2015-03-09 NOTE — Progress Notes (Addendum)
ASSESSMENT: Pt currently experiencing symptoms of depression. Pt needs to f/u with PCP and [facility]; would benefit from psychoeducation and brief therapeutic interventions regarding coping with symptoms of depression. Stage of Change: contemplative  PLAN: 1. F/U with behavioral health consultant in two weeks 2. Psychiatric Medications: Prozac(starting today) 3. Behavioral recommendation(s):   -Consider increasing social support via [employer] (English classes, etc.) -Consider reading educational materials regarding coping with symptoms of depression (in Arabic) SUBJECTIVE: Pt. referred by Dr Jarold Song for symptoms of depression:  Pt. reports the following symptoms/concerns: Pt states that she first went through a depression in 2004-05-12, after husband passed away in [place], and now she feels depression coming back, and wants to be treated before it becomes worse; was on antidepressants that helped in 05-12-2004, but does not know name of meds. She copes by working, calling her one friend in town, but friend is not always available to talk. Duration of problem: Over one month Severity: moderate  OBJECTIVE: Orientation & Cognition: Oriented x3. Thought processes normal and appropriate to situation. Mood: low. Affect: appropriate Appearance: appropriate Risk of harm to self or others: no known risk of harm to self or others Substance use: none Assessments administered: None, patient losing voice, so limited talking today  Diagnosis: Depression CPT Code: F32.9 -------------------------------------------- Other(s) present in the room: phone interpreter, Arabic  Time spent with patient in exam room: 45 minutes, 2:07-2:52pm   Depression screen PHQ 2/9 03/16/2013  Decreased Interest 0  Down, Depressed, Hopeless 0  PHQ - 2 Score 0

## 2015-03-09 NOTE — Progress Notes (Signed)
Patient is here for treatment of her depression She was taking medication for it while living in [place] but does not remember the name

## 2015-03-10 LAB — TSH: TSH: 67.63 mIU/L — ABNORMAL HIGH

## 2015-04-07 MED FILL — ?LEVOTHYROXINE 88 MCG TAB: 88 | 30 days supply | Qty: 30 | Fill #3

## 2015-04-15 MED FILL — ?FLUOXETINE HCL 20MG TABLET: 20 | 30 days supply | Qty: 30 | Fill #1

## 2015-05-10 MED FILL — ?LEVOTHYROXINE 88 MCG TAB: 88 | 30 days supply | Qty: 30 | Fill #4

## 2015-06-14 MED FILL — ?LEVOTHYROXINE 88 MCG TAB: 88 | 30 days supply | Qty: 30 | Fill #5

## 2015-06-14 MED FILL — ?FLUOXETINE HCL 20MG TABLET: 20 | 30 days supply | Qty: 30 | Fill #2

## 2015-07-26 MED FILL — ?FLUOXETINE HCL 20MG TABLET: 20 | 30 days supply | Qty: 30 | Fill #3

## 2015-07-27 MED FILL — ?LEVOTHYROXINE 88 MCG TAB: 88 | 30 days supply | Qty: 30 | Fill #6

## 2015-08-25 ENCOUNTER — Ambulatory Visit: Payer: Self-pay | Attending: Family Medicine | Admitting: Family Medicine

## 2015-08-25 ENCOUNTER — Encounter: Payer: Self-pay | Admitting: Family Medicine

## 2015-08-25 VITALS — BP 111/72 | HR 79 | Temp 98.7°F | Ht 62.5 in | Wt 146.0 lb

## 2015-08-25 DIAGNOSIS — R1032 Left lower quadrant pain: Secondary | ICD-10-CM

## 2015-08-25 DIAGNOSIS — R51 Headache: Secondary | ICD-10-CM | POA: Insufficient documentation

## 2015-08-25 DIAGNOSIS — R42 Dizziness and giddiness: Secondary | ICD-10-CM | POA: Insufficient documentation

## 2015-08-25 DIAGNOSIS — F32A Depression, unspecified: Secondary | ICD-10-CM

## 2015-08-25 DIAGNOSIS — F329 Major depressive disorder, single episode, unspecified: Secondary | ICD-10-CM

## 2015-08-25 DIAGNOSIS — E038 Other specified hypothyroidism: Secondary | ICD-10-CM

## 2015-08-25 DIAGNOSIS — Z13228 Encounter for screening for other metabolic disorders: Secondary | ICD-10-CM

## 2015-08-25 DIAGNOSIS — R5383 Other fatigue: Secondary | ICD-10-CM

## 2015-08-25 LAB — LIPID PANEL
Cholesterol: 195 mg/dL (ref 125–200)
HDL: 67 mg/dL (ref >=46)
LDL Cholesterol: 111 mg/dL (ref <130)
TRIGLYCERIDES: 84 mg/dL (ref <150)
Total CHOL/HDL Ratio: 2.9 Ratio (ref <=5.0)
VLDL: 17 mg/dL (ref <30)

## 2015-08-25 LAB — COMPLETE METABOLIC PANEL WITH GFR
ALT: 9 U/L (ref 6–29)
AST: 15 U/L (ref 10–30)
Albumin: 3.9 g/dL (ref 3.6–5.1)
Alkaline Phosphatase: 83 U/L (ref 33–115)
BUN: 9 mg/dL (ref 7–25)
CO2: 25 mmol/L (ref 20–31)
Calcium: 9.1 mg/dL (ref 8.6–10.2)
Chloride: 105 mmol/L (ref 98–110)
Creat: 0.71 mg/dL (ref 0.50–1.10)
GFR, Est African American: >89 mL/min (ref >=60)
GFR, Est Non African American: >89 mL/min (ref >=60)
GLUCOSE: 89 mg/dL (ref 65–99)
POTASSIUM: 4.4 mmol/L (ref 3.5–5.3)
SODIUM: 137 mmol/L (ref 135–146)
Total Bilirubin: 0.3 mg/dL (ref 0.2–1.2)
Total Protein: 7 g/dL (ref 6.1–8.1)

## 2015-08-25 LAB — CBC WITH DIFFERENTIAL/PLATELET
BASOS PCT: 2 %
Basophils Absolute: 86 cells/uL (ref 0–200)
EOS PCT: 6 %
Eosinophils Absolute: 258 cells/uL (ref 15–500)
HCT: 38.1 % (ref 35.0–45.0)
Hemoglobin: 12.7 g/dL (ref 11.7–15.5)
Lymphocytes Relative: 32 %
Lymphs Abs: 1376 cells/uL (ref 850–3900)
MCH: 28.7 pg (ref 27.0–33.0)
MCHC: 33.3 g/dL (ref 32.0–36.0)
MCV: 86.2 fL (ref 80.0–100.0)
MONOS PCT: 6 %
MPV: 10.7 fL (ref 7.5–12.5)
Monocytes Absolute: 258 cells/uL (ref 200–950)
NEUTROS ABS: 2322 {cells}/uL (ref 1500–7800)
Neutrophils Relative %: 54 %
PLATELETS: 234 10*3/uL (ref 140–400)
RBC: 4.42 MIL/uL (ref 3.80–5.10)
RDW: 14.3 % (ref 11.0–15.0)
WBC: 4.3 10*3/uL (ref 3.8–10.8)

## 2015-08-25 LAB — TSH: TSH: 6.91 m[IU]/L — AB

## 2015-08-25 MED ORDER — FLUOXETINE HCL 20 MG PO TABS: 20.0000 mg | ORAL_TABLET | Freq: Every day | ORAL | 3 refills | Status: DC

## 2015-08-25 MED FILL — FLUoxetine HCL 20 MG CAPS: 20 | 30 days supply | Qty: 30 | Fill #0

## 2015-08-25 NOTE — Progress Notes (Signed)
Headache, fatigue, dizziness Pain in lower abdomen-constant- "like a period pain" Decided to stop prozac on her own

## 2015-08-25 NOTE — Progress Notes (Signed)
Subjective:  Patient ID: Kaitlyn Wise, female    DOB: [birth year]  Age: 45 y.o. MRN: [id number]  CC: Fatigue; Dizziness; Headache (pain in neck right side); Hypothyroidism; and Depression   HPI Kaitlyn Wise is a 45 year old female with a history of depression, GERD, hypothyroidism who comes into the clinic for follow-up visit.  She has been out of her Prozac and would like to have a refill. Compliant with levothyroxine and last TSH performed 6 months ago was elevated.  She complains of fatigue, dizziness over the last 2-3 months but denies syncopal episodes. She also endorses lower abdominal pain more in the left pelvic region (LMP was 08/06/15). Denies nausea, vomiting, diarrhea or constipation. Denies urinary symptoms.  Denies reflux symptoms and has stopped using PPI  Past Medical History:  Diagnosis Date  . Thyroid disease   . Uterine fibroid     Past Surgical History:  Procedure Laterality Date  . CHOLECYSTECTOMY         Outpatient Medications Prior to Visit  Medication Sig Dispense Refill  . levothyroxine (SYNTHROID, LEVOTHROID) 88 MCG tablet Take 1 tablet (88 mcg total) by mouth daily. 90 tablet 3  . hydrocortisone 2.5 % cream Apply topically 2 (two) times daily. (Patient not taking: Reported on 03/09/2015) 30 g 0  . pantoprazole (PROTONIX) 40 MG tablet Take 1 tablet (40 mg total) by mouth daily. (Patient not taking: Reported on 08/25/2015) 30 tablet 3  . cetirizine (ZYRTEC) 10 MG tablet Take 1 tablet (10 mg total) by mouth daily. (Patient not taking: Reported on 03/09/2015) 30 tablet 3  . FLUoxetine (PROZAC) 20 MG tablet Take 1 tablet (20 mg total) by mouth daily. (Patient not taking: Reported on 08/25/2015) 30 tablet 3  . ibuprofen (ADVIL,MOTRIN) 400 MG tablet Take 1 tablet (400 mg total) by mouth every 6 (six) hours as needed. (Patient not taking: Reported on 03/09/2015) 15 tablet 0  . methylPREDNISolone (MEDROL DOSEPAK) 4 MG tablet follow package directions (Patient not  taking: Reported on 03/09/2015) 21 tablet 0  . norgestimate-ethinyl estradiol (ORTHO-CYCLEN,SPRINTEC,PREVIFEM) 0.25-35 MG-MCG tablet Take 1 tablet by mouth daily. (Patient not taking: Reported on 03/09/2015) 1 Package 0   No facility-administered medications prior to visit.     ROS Review of Systems  Constitutional: Positive for fatigue. Negative for activity change and appetite change.  HENT: Negative for congestion, sinus pressure and sore throat.   Eyes: Negative for visual disturbance.  Respiratory: Negative for cough, chest tightness, shortness of breath and wheezing.   Cardiovascular: Negative for chest pain and palpitations.  Gastrointestinal: Positive for abdominal pain. Negative for abdominal distention and constipation.  Endocrine: Negative for polydipsia.  Genitourinary: Negative for dysuria and frequency.  Musculoskeletal: Negative for arthralgias and back pain.  Skin: Negative for rash.  Neurological: Positive for dizziness. Negative for tremors, light-headedness and numbness.  Hematological: Does not bruise/bleed easily.  Psychiatric/Behavioral: Negative for agitation and behavioral problems.    Objective:  BP 111/72 (BP Location: Left Arm, Patient Position: Sitting, Cuff Size: Small)   Pulse 79   Temp 98.7 F (37.1 C) (Oral)   Ht 5' 2.5" (1.588 m)   Wt 146 lb (66.2 kg)   SpO2 99%   BMI 26.28 kg/m   BP/Weight 08/25/2015 03/09/2015 [id number]  Systolic BP 99991111 [id number]  Diastolic BP 72 65 70  Wt. (Lbs) 146 138 133.4  BMI 26.28 25.23 24.39      Physical Exam  Constitutional: She is oriented to person, place, and time. She appears well-developed and well-nourished.  Cardiovascular: Normal rate, normal heart sounds and intact distal pulses.   No murmur heard. Pulmonary/Chest: Effort normal and breath sounds normal. She has no wheezes. She has no rales. She exhibits no tenderness.  Abdominal: Soft. Bowel sounds are normal. She exhibits no distension and no mass.  There is tenderness (mild left lower quadrant tenderness).  Musculoskeletal: Normal range of motion.  Neurological: She is alert and oriented to person, place, and time.    Lab Results  Component Value Date   TSH 67.63 (H) 03/09/2015     Assessment & Plan:   1. Other specified hypothyroidism Uncontrolled This could explain fatigue We'll send a TSH and refill levothyroxine after this - TSH  2. Other fatigue - COMPLETE METABOLIC PANEL WITH GFR - CBC with Differential/Platelet - VITAMIN D 25 Hydroxy (Vit-D Deficiency, Fractures)  3. Screening for metabolic disorder - Lipid panel  4. Depression Stable - FLUoxetine (PROZAC) 20 MG tablet; Take 1 tablet (20 mg total) by mouth daily.  Dispense: 30 tablet; Refill: 3  5. Abdominal pain, left lower quadrant Could be mittelschmerz pain Scheduled for a Pap smear as she is due for one.   Meds ordered this encounter  Medications  . FLUoxetine (PROZAC) 20 MG tablet    Sig: Take 1 tablet (20 mg total) by mouth daily.    Dispense:  30 tablet    Refill:  3    Follow-up: Return in about 3 weeks (around 09/15/2015), or if symptoms worsen or fail to improve, for complete physical exam.   Arnoldo Morale MD

## 2015-08-25 NOTE — Patient Instructions (Signed)
Fatigue  Fatigue is feeling tired all of the time, a lack of energy, or a lack of motivation. Occasional or mild fatigue is often a normal response to activity or life in general. However, long-lasting (chronic) or extreme fatigue may indicate an underlying medical condition.  HOME CARE INSTRUCTIONS   Watch your fatigue for any changes. The following actions may help to lessen any discomfort you are feeling:  · Talk to your health care provider about how much sleep you need each night. Try to get the required amount every night.  · Take medicines only as directed by your health care provider.  · Eat a healthy and nutritious diet. Ask your health care provider if you need help changing your diet.  · Drink enough fluid to keep your urine clear or pale yellow.  · Practice ways of relaxing, such as yoga, meditation, massage therapy, or acupuncture.  · Exercise regularly.    · Change situations that cause you stress. Try to keep your work and personal routine reasonable.  · Do not abuse illegal drugs.  · Limit alcohol intake to no more than 1 drink per day for nonpregnant women and 2 drinks per day for men. One drink equals 12 ounces of beer, 5 ounces of wine, or 1½ ounces of hard liquor.  · Take a multivitamin, if directed by your health care provider.  SEEK MEDICAL CARE IF:   · Your fatigue does not get better.  · You have a fever.    · You have unintentional weight loss or gain.  · You have headaches.    · You have difficulty:      Falling asleep.    Sleeping throughout the night.  · You feel angry, guilty, anxious, or sad.     · You are unable to have a bowel movement (constipation).    · You skin is dry.     · Your legs or another part of your body is swollen.    SEEK IMMEDIATE MEDICAL CARE IF:   · You feel confused.    · Your vision is blurry.  · You feel faint or pass out.    · You have a severe headache.    · You have severe abdominal, pelvic, or back pain.    · You have chest pain, shortness of breath, or an  irregular or fast heartbeat.    · You are unable to urinate or you urinate less than normal.    · You develop abnormal bleeding, such as bleeding from the rectum, vagina, nose, lungs, or nipples.  · You vomit blood.     · You have thoughts about harming yourself or committing suicide.    · You are worried that you might harm someone else.       This information is not intended to replace advice given to you by your health care provider. Make sure you discuss any questions you have with your health care provider.     Document Released: 10/29/2006 Document Revised: 01/22/2014 Document Reviewed: 05/05/2013  Elsevier Interactive Patient Education ©2016 [employer]

## 2015-08-26 ENCOUNTER — Other Ambulatory Visit: Payer: Self-pay | Admitting: Family Medicine

## 2015-08-26 DIAGNOSIS — E039 Hypothyroidism, unspecified: Secondary | ICD-10-CM

## 2015-08-26 LAB — VITAMIN D 25 HYDROXY (VIT D DEFICIENCY, FRACTURES): VIT D 25 HYDROXY: 8 ng/mL — AB (ref 30–100)

## 2015-08-26 MED ORDER — LEVOTHYROXINE SODIUM 100 MCG PO TABS: 100.0000 ug | ORAL_TABLET | Freq: Every day | ORAL | 2 refills | Status: DC

## 2015-08-26 MED ORDER — ERGOCALCIFEROL 1.25 MG (50000 UT) PO CAPS: 50000.0000 [IU] | ORAL_CAPSULE | ORAL | 0 refills | Status: DC

## 2015-09-08 ENCOUNTER — Telehealth: Payer: Self-pay

## 2015-09-08 MED FILL — ?LEVOTHYROXINE 100 MCG TAB: 100 | 30 days supply | Qty: 30 | Fill #0

## 2015-09-08 NOTE — Telephone Encounter (Signed)
Writer called patient back with lab results through a [facility] [employer].  Patient understand that her Vitamin D level is low and MD has sent a prescription for Vit D.  Patient's thyroid level was also low so patient also has a second prescription for levo

## 2015-09-08 NOTE — Telephone Encounter (Signed)
Telephone Encounter  Kaitlyn Wise (MR# [id number])  Telephone Encounter Info   Author Note Status Last Update User Last Update Date/Time  Lujean Jojo, RN Signed Lujean Shanoah, RN 09/08/2015 3:25 PM  Telephone Encounter      Writer called patient back with lab results through a [facility] [employer].  Patient understand that her Vitamin D level is low and MD has sent a prescription for Vit D.  Patient's thyroid level was also low so patient also has a second prescription for levothyroxine 100 mcg daily.

## 2015-09-09 MED FILL — VIT D2 1.25 MG (50,000 UNIT: 1.25 MG | 60 days supply | Qty: 9 | Fill #0

## 2015-09-16 ENCOUNTER — Encounter: Payer: Self-pay | Admitting: Family Medicine

## 2015-09-16 ENCOUNTER — Ambulatory Visit: Payer: PRIVATE HEALTH INSURANCE | Attending: Family Medicine | Admitting: Family Medicine

## 2015-09-16 DIAGNOSIS — Z1239 Encounter for other screening for malignant neoplasm of breast: Secondary | ICD-10-CM | POA: Diagnosis not present

## 2015-09-16 DIAGNOSIS — N920 Excessive and frequent menstruation with regular cycle: Secondary | ICD-10-CM

## 2015-09-16 DIAGNOSIS — Z0001 Encounter for general adult medical examination with abnormal findings: Secondary | ICD-10-CM

## 2015-09-16 DIAGNOSIS — Z124 Encounter for screening for malignant neoplasm of cervix: Secondary | ICD-10-CM | POA: Diagnosis not present

## 2015-09-16 DIAGNOSIS — R6889 Other general symptoms and signs: Secondary | ICD-10-CM

## 2015-09-16 NOTE — Progress Notes (Signed)
Subjective:  Patient ID: Kaitlyn Wise, female    DOB: Sep 24, 1970  Age: 45 y.o. MRN: [id number]  CC: Annual Exam (w/PAP)   HPI Kaitlyn Wise presents for a complete physical exam. She complains of menorrhagia and ocassional lower abdominal pain  Past Medical History:  Diagnosis Date  . Thyroid disease   . Uterine fibroid     Past Surgical History:  Procedure Laterality Date  . CHOLECYSTECTOMY       Outpatient Medications Prior to Visit  Medication Sig Dispense Refill  . ergocalciferol (DRISDOL) 50000 units capsule Take 1 capsule (50,000 Units total) by mouth once a week. 9 capsule 0  . FLUoxetine (PROZAC) 20 MG tablet Take 1 tablet (20 mg total) by mouth daily. 30 tablet 3  . levothyroxine (SYNTHROID, LEVOTHROID) 100 MCG tablet Take 1 tablet (100 mcg total) by mouth daily. 30 tablet 2  . hydrocortisone 2.5 % cream Apply topically 2 (two) times daily. (Patient not taking: Reported on 03/09/2015) 30 g 0  . pantoprazole (PROTONIX) 40 MG tablet Take 1 tablet (40 mg total) by mouth daily. (Patient not taking: Reported on 08/25/2015) 30 tablet 3   No facility-administered medications prior to visit.     ROS Review of Systems  Constitutional: Negative for activity change, appetite change and fatigue.  HENT: Negative for congestion, sinus pressure and sore throat.   Eyes: Negative for visual disturbance.  Respiratory: Negative for cough, chest tightness, shortness of breath and wheezing.   Cardiovascular: Negative for chest pain and palpitations.  Gastrointestinal: Negative for abdominal distention, abdominal pain and constipation.  Endocrine: Negative for polydipsia.  Genitourinary: Positive for menstrual problem and pelvic pain. Negative for dysuria and frequency.  Musculoskeletal: Negative for arthralgias and back pain.  Skin: Negative for rash.  Neurological: Negative for tremors, light-headedness and numbness.  Hematological: Does not bruise/bleed easily.    Psychiatric/Behavioral: Negative for agitation and behavioral problems.    Objective:  BP 97/66 (BP Location: Left Arm, Patient Position: Sitting, Cuff Size: Large)   Pulse 80   Temp 98.1 F (36.7 C) (Oral)   Ht 5' (1.524 m)   Wt 150 lb (68 kg)   LMP 09/02/2015 (Exact Date)   BMI 29.29 kg/m   BP/Weight 09/16/2015 08/25/2015 0000000  Systolic BP 97 99991111 [id number]  Diastolic BP 66 72 65  Wt. (Lbs) 150 146 138  BMI 29.29 26.28 25.23      Physical Exam  Constitutional: She is oriented to person, place, and time. She appears well-developed and well-nourished. No distress.  HENT:  Head: Normocephalic.  Right Ear: External ear normal.  Left Ear: External ear normal.  Nose: Nose normal.  Mouth/Throat: Oropharynx is clear and moist.  Eyes: Conjunctivae and EOM are normal. Pupils are equal, round, and reactive to light.  Neck: Normal range of motion. No JVD present.  Cardiovascular: Normal rate, regular rhythm, normal heart sounds and intact distal pulses.  Exam reveals no gallop.   No murmur heard. Pulmonary/Chest: Effort normal and breath sounds normal. No respiratory distress. She has no wheezes. She has no rales. She exhibits no tenderness. Right breast exhibits no mass and no tenderness. Left breast exhibits no mass and no tenderness.  Abdominal: Soft. Bowel sounds are normal. She exhibits no distension and no mass. There is no tenderness.  Musculoskeletal: Normal range of motion. She exhibits no edema or tenderness.  Neurological: She is alert and oriented to person, place, and time. She has normal reflexes.  Skin: Skin is warm and dry. She is  not diaphoretic.  Psychiatric: She has a normal mood and affect.     Assessment & Plan:   1. Encounter for general adult medical examination with abnormal findings   2. Menorrhagia with regular cycle Will need to exclude uterine fibroids. - US Pelvis Complete; Future - US Transvaginal Non-OB; Future  3. Screening for breast cancer -  Mammogram Digital Screening; Future  4. Screening for cervical cancer - Cytology - PAP ([facility])   No orders of the defined types were placed in this encounter.   Follow-up: Return in 3 months (on 12/16/2015) for follow up of hypothyroidism.   Kaitlyn Morale MD

## 2015-09-16 NOTE — Progress Notes (Signed)
Pt states she is today for the following:  Annual Physical with PAP  Pt states she has no other complaints at this time.

## 2015-09-16 NOTE — Patient Instructions (Signed)
Health Maintenance, Female Adopting a healthy lifestyle and getting preventive care can go a long way to promote health and wellness. Talk with your health care provider about what schedule of regular examinations is right for you. This is a good chance for you to check in with your provider about disease prevention and staying healthy. In between checkups, there are plenty of things you can do on your own. Experts have done a lot of research about which lifestyle changes and preventive measures are most likely to keep you healthy. Ask your health care provider for more information. WEIGHT AND DIET  Eat a healthy diet  Be sure to include plenty of vegetables, fruits, low-fat dairy products, and lean protein.  Do not eat a lot of foods high in solid fats, added sugars, or salt.  Get regular exercise. This is one of the most important things you can do for your health.  Most adults should exercise for at least 150 minutes each week. The exercise should increase your heart rate and make you sweat (moderate-intensity exercise).  Most adults should also do strengthening exercises at least twice a week. This is in addition to the moderate-intensity exercise.  Maintain a healthy weight  Body mass index (BMI) is a measurement that can be used to identify possible weight problems. It estimates body fat based on height and weight. Your health care provider can help determine your BMI and help you achieve or maintain a healthy weight.  For females 28 years of age and older:   A BMI below 18.5 is considered underweight.  A BMI of 18.5 to 24.9 is normal.  A BMI of 25 to 29.9 is considered overweight.  A BMI of 30 and above is considered obese.  Watch levels of cholesterol and blood lipids  You should start having your blood tested for lipids and cholesterol at 43 years of age, then have this test every 5 years.  You may need to have your cholesterol levels checked more often if:  Your lipid  or cholesterol levels are high.  You are older than 43 years of age.  You are at high risk for heart disease.  CANCER SCREENING   Lung Cancer  Lung cancer screening is recommended for adults 75-66 years old who are at high risk for lung cancer because of a history of smoking.  A yearly low-dose CT scan of the lungs is recommended for people who:  Currently smoke.  Have quit within the past 15 years.  Have at least a 30-pack-year history of smoking. A pack year is smoking an average of one pack of cigarettes a day for 1 year.  Yearly screening should continue until it has been 15 years since you quit.  Yearly screening should stop if you develop a health problem that would prevent you from having lung cancer treatment.  Breast Cancer  Practice breast self-awareness. This means understanding how your breasts normally appear and feel.  It also means doing regular breast self-exams. Let your health care provider know about any changes, no matter how small.  If you are in your 20s or 30s, you should have a clinical breast exam (CBE) by a health care provider every 1-3 years as part of a regular health exam.  If you are 25 or older, have a CBE every year. Also consider having a breast X-ray (mammogram) every year.  If you have a family history of breast cancer, talk to your health care provider about genetic screening.  If you  are at high risk for breast cancer, talk to your health care provider about having an MRI and a mammogram every year.  Breast cancer gene (BRCA) assessment is recommended for women who have family members with BRCA-related cancers. BRCA-related cancers include:  Breast.  Ovarian.  Tubal.  Peritoneal cancers.  Results of the assessment will determine the need for genetic counseling and BRCA1 and BRCA2 testing. Cervical Cancer Your health care provider may recommend that you be screened regularly for cancer of the pelvic organs (ovaries, uterus, and  vagina). This screening involves a pelvic examination, including checking for microscopic changes to the surface of your cervix (Pap test). You may be encouraged to have this screening done every 3 years, beginning at age 21.  For women ages 30-65, health care providers may recommend pelvic exams and Pap testing every 3 years, or they may recommend the Pap and pelvic exam, combined with testing for human papilloma virus (HPV), every 5 years. Some types of HPV increase your risk of cervical cancer. Testing for HPV may also be done on women of any age with unclear Pap test results.  Other health care providers may not recommend any screening for nonpregnant women who are considered low risk for pelvic cancer and who do not have symptoms. Ask your health care provider if a screening pelvic exam is right for you.  If you have had past treatment for cervical cancer or a condition that could lead to cancer, you need Pap tests and screening for cancer for at least 20 years after your treatment. If Pap tests have been discontinued, your risk factors (such as having a new sexual partner) need to be reassessed to determine if screening should resume. Some women have medical problems that increase the chance of getting cervical cancer. In these cases, your health care provider may recommend more frequent screening and Pap tests. Colorectal Cancer  This type of cancer can be detected and often prevented.  Routine colorectal cancer screening usually begins at 43 years of age and continues through 43 years of age.  Your health care provider may recommend screening at an earlier age if you have risk factors for colon cancer.  Your health care provider may also recommend using home test kits to check for hidden blood in the stool.  A small camera at the end of a tube can be used to examine your colon directly (sigmoidoscopy or colonoscopy). This is done to check for the earliest forms of colorectal  cancer.  Routine screening usually begins at age 50.  Direct examination of the colon should be repeated every 5-10 years through 43 years of age. However, you may need to be screened more often if early forms of precancerous polyps or small growths are found. Skin Cancer  Check your skin from head to toe regularly.  Tell your health care provider about any new moles or changes in moles, especially if there is a change in a mole's shape or color.  Also tell your health care provider if you have a mole that is larger than the size of a pencil eraser.  Always use sunscreen. Apply sunscreen liberally and repeatedly throughout the day.  Protect yourself by wearing long sleeves, pants, a wide-brimmed hat, and sunglasses whenever you are outside. HEART DISEASE, DIABETES, AND HIGH BLOOD PRESSURE   High blood pressure causes heart disease and increases the risk of stroke. High blood pressure is more likely to develop in:  People who have blood pressure in the high end   of the normal range (130-139/85-89 mm Hg).  People who are overweight or obese.  People who are African American.  If you are 38-23 years of age, have your blood pressure checked every 3-5 years. If you are 61 years of age or older, have your blood pressure checked every year. You should have your blood pressure measured twice--once when you are at a hospital or clinic, and once when you are not at a hospital or clinic. Record the average of the two measurements. To check your blood pressure when you are not at a hospital or clinic, you can use:  An automated blood pressure machine at a pharmacy.  A home blood pressure monitor.  If you are between 45 years and 39 years old, ask your health care provider if you should take aspirin to prevent strokes.  Have regular diabetes screenings. This involves taking a blood sample to check your fasting blood sugar level.  If you are at a normal weight and have a low risk for diabetes,  have this test once every three years after 43 years of age.  If you are overweight and have a high risk for diabetes, consider being tested at a younger age or more often. PREVENTING INFECTION  Hepatitis B  If you have a higher risk for hepatitis B, you should be screened for this virus. You are considered at high risk for hepatitis B if:  You were born in a country where hepatitis B is common. Ask your health care provider which countries are considered high risk.  Your parents were born in a high-risk country, and you have not been immunized against hepatitis B (hepatitis B vaccine).  You have HIV or AIDS.  You use needles to inject street drugs.  You live with someone who has hepatitis B.  You have had sex with someone who has hepatitis B.  You get hemodialysis treatment.  You take certain medicines for conditions, including cancer, organ transplantation, and autoimmune conditions. Hepatitis C  Blood testing is recommended for:  Everyone born from [birth year] through 1963.  Anyone with known risk factors for hepatitis C. Sexually transmitted infections (STIs)  You should be screened for sexually transmitted infections (STIs) including gonorrhea and chlamydia if:  You are sexually active and are younger than 43 years of age.  You are older than 43 years of age and your health care provider tells you that you are at risk for this type of infection.  Your sexual activity has changed since you were last screened and you are at an increased risk for chlamydia or gonorrhea. Ask your health care provider if you are at risk.  If you do not have HIV, but are at risk, it may be recommended that you take a prescription medicine daily to prevent HIV infection. This is called pre-exposure prophylaxis (PrEP). You are considered at risk if:  You are sexually active and do not regularly use condoms or know the HIV status of your partner(s).  You take drugs by injection.  You are sexually  active with a partner who has HIV. Talk with your health care provider about whether you are at high risk of being infected with HIV. If you choose to begin PrEP, you should first be tested for HIV. You should then be tested every 3 months for as long as you are taking PrEP.  PREGNANCY   If you are premenopausal and you may become pregnant, ask your health care provider about preconception counseling.  If you may  become pregnant, take 400 to 800 micrograms (mcg) of folic acid every day.  If you want to prevent pregnancy, talk to your health care provider about birth control (contraception). OSTEOPOROSIS AND MENOPAUSE   Osteoporosis is a disease in which the bones lose minerals and strength with aging. This can result in serious bone fractures. Your risk for osteoporosis can be identified using a bone density scan.  If you are 61 years of age or older, or if you are at risk for osteoporosis and fractures, ask your health care provider if you should be screened.  Ask your health care provider whether you should take a calcium or vitamin D supplement to lower your risk for osteoporosis.  Menopause may have certain physical symptoms and risks.  Hormone replacement therapy may reduce some of these symptoms and risks. Talk to your health care provider about whether hormone replacement therapy is right for you.  HOME CARE INSTRUCTIONS   Schedule regular health, dental, and eye exams.  Stay current with your immunizations.   Do not use any tobacco products including cigarettes, chewing tobacco, or electronic cigarettes.  If you are pregnant, do not drink alcohol.  If you are breastfeeding, limit how much and how often you drink alcohol.  Limit alcohol intake to no more than 1 drink per day for nonpregnant women. One drink equals 12 ounces of beer, 5 ounces of wine, or 1 ounces of hard liquor.  Do not use street drugs.  Do not share needles.  Ask your health care provider for help if  you need support or information about quitting drugs.  Tell your health care provider if you often feel depressed.  Tell your health care provider if you have ever been abused or do not feel safe at home.   This information is not intended to replace advice given to you by your health care provider. Make sure you discuss any questions you have with your health care provider.   Document Released: 07/17/2010 Document Revised: 01/22/2014 Document Reviewed: 12/03/2012 Elsevier Interactive Patient Education [employer].

## 2015-09-20 LAB — CYTOLOGY - PAP

## 2015-09-30 ENCOUNTER — Ambulatory Visit
Admission: RE | Admit: 2015-09-30 | Discharge: 2015-09-30 | Disposition: A | Discharge status: Home / Self Care | Payer: PRIVATE HEALTH INSURANCE | Source: Ambulatory Visit | Attending: Family Medicine | Admitting: Family Medicine

## 2015-09-30 ENCOUNTER — Ambulatory Visit: Payer: Medicaid Other

## 2015-09-30 DIAGNOSIS — Z1239 Encounter for other screening for malignant neoplasm of breast: Secondary | ICD-10-CM

## 2015-10-03 ENCOUNTER — Ambulatory Visit (HOSPITAL_COMMUNITY): Payer: Medicaid Other

## 2015-10-17 MED FILL — ?LEVOTHYROXINE 100 MCG TAB: 100 | 30 days supply | Qty: 30 | Fill #1

## 2015-11-09 ENCOUNTER — Emergency Department (HOSPITAL_COMMUNITY)
Admission: EM | Admit: 2015-11-09 | Discharge: 2015-11-09 | Disposition: A | Discharge status: Home / Self Care | Payer: PRIVATE HEALTH INSURANCE | Attending: Emergency Medicine | Admitting: Emergency Medicine

## 2015-11-09 ENCOUNTER — Emergency Department (HOSPITAL_COMMUNITY): Payer: PRIVATE HEALTH INSURANCE

## 2015-11-09 DIAGNOSIS — E039 Hypothyroidism, unspecified: Secondary | ICD-10-CM | POA: Insufficient documentation

## 2015-11-09 DIAGNOSIS — Y939 Activity, unspecified: Secondary | ICD-10-CM | POA: Insufficient documentation

## 2015-11-09 DIAGNOSIS — S20219A Contusion of unspecified front wall of thorax, initial encounter: Secondary | ICD-10-CM | POA: Insufficient documentation

## 2015-11-09 DIAGNOSIS — Y929 Unspecified place or not applicable: Secondary | ICD-10-CM | POA: Insufficient documentation

## 2015-11-09 DIAGNOSIS — W1830XA Fall on same level, unspecified, initial encounter: Secondary | ICD-10-CM | POA: Insufficient documentation

## 2015-11-09 DIAGNOSIS — Y990 Civilian activity done for income or pay: Secondary | ICD-10-CM | POA: Insufficient documentation

## 2015-11-09 MED ORDER — KETOROLAC TROMETHAMINE 30 MG/ML IJ SOLN
60.0000 mg | Freq: Once | INTRAMUSCULAR | Status: AC
  Administered 2015-11-09: 60 mg via INTRAMUSCULAR
  Filled 2015-11-09: qty 2

## 2015-11-09 MED ORDER — NAPROXEN 500 MG PO TABS: ORAL_TABLET | ORAL | 0 refills | Status: DC

## 2015-11-09 MED ORDER — METHOCARBAMOL 500 MG PO TABS: ORAL_TABLET | ORAL | 0 refills | Status: DC
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
## 2015-11-09 NOTE — ED Notes (Signed)
Bed: [id number] Expected date:  Expected time:  Means of arrival:  Comments: 45 yr old f, fall, chest injury

## 2015-11-09 NOTE — Discharge Instructions (Signed)
Ice packs for comfort. Do not leave on skin, put a towel between the ice pack and your skin so you don't get frost bite. Take the medications as prescribed. Recheck if you get a fever, cough, struggle to breath or feel worse and not improving in the next week.   hazm aljalid lilraahati. la tatrak ealaa aljuladi, wawade munashafatan bayn hizmat aljalid waljald alkhasi bik hataa la tahsul ealaa lidughat alsaqiei. tanawul al'adwiat kama hu muhdadun. 'iieadat fahs 'iidha kunt tahsul ealaa hamaa, walsaeali, walkifah min ajl altanafus 'aw 'asheur 'aswa waeadm altahasun fi al'usbue almuqbil.

## 2015-11-09 NOTE — ED Notes (Signed)
Translator [phone]
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
## 2015-11-11 ENCOUNTER — Emergency Department (HOSPITAL_COMMUNITY): Payer: No Typology Code available for payment source

## 2015-11-11 ENCOUNTER — Emergency Department (HOSPITAL_COMMUNITY)
Admission: EM | Admit: 2015-11-11 | Discharge: 2015-11-12 | Disposition: A | Discharge status: Home / Self Care | Payer: No Typology Code available for payment source | Attending: Emergency Medicine | Admitting: Emergency Medicine

## 2015-11-11 ENCOUNTER — Encounter (HOSPITAL_COMMUNITY): Payer: Self-pay

## 2015-11-11 DIAGNOSIS — H5340 Unspecified visual field defects: Secondary | ICD-10-CM | POA: Diagnosis present

## 2015-11-11 DIAGNOSIS — R42 Dizziness and giddiness: Secondary | ICD-10-CM | POA: Diagnosis not present

## 2015-11-11 DIAGNOSIS — E039 Hypothyroidism, unspecified: Secondary | ICD-10-CM | POA: Insufficient documentation

## 2015-11-11 LAB — CBC WITH DIFFERENTIAL/PLATELET
BASOS ABS: 0.1 10*3/uL (ref 0.0–0.1)
BASOS PCT: 1 %
EOS ABS: 0.3 10*3/uL (ref 0.0–0.7)
Eosinophils Relative: 6 %
HEMATOCRIT: 36.3 % (ref 36.0–46.0)
HEMOGLOBIN: 12.2 g/dL (ref 12.0–15.0)
Lymphocytes Relative: 39 %
Lymphs Abs: 2.2 10*3/uL (ref 0.7–4.0)
MCH: 28.4 pg (ref 26.0–34.0)
MCHC: 33.6 g/dL (ref 30.0–36.0)
MCV: 84.6 fL (ref 78.0–100.0)
Monocytes Absolute: 0.6 10*3/uL (ref 0.1–1.0)
Monocytes Relative: 10 %
NEUTROS ABS: 2.4 10*3/uL (ref 1.7–7.7)
NEUTROS PCT: 44 %
Platelets: 154 10*3/uL (ref 150–400)
RBC: 4.29 MIL/uL (ref 3.87–5.11)
RDW: 13.6 % (ref 11.5–15.5)
WBC: 5.6 10*3/uL (ref 4.0–10.5)

## 2015-11-11 MED ORDER — ONDANSETRON HCL 4 MG/2ML IJ SOLN
4.0000 mg | Freq: Once | INTRAMUSCULAR | Status: AC
  Administered 2015-11-11: 4 mg via INTRAVENOUS
  Filled 2015-11-11: qty 2

## 2015-11-11 MED ORDER — SODIUM CHLORIDE 0.9 % IV BOLUS (SEPSIS)
1000.0000 mL | Freq: Once | INTRAVENOUS | Status: AC
  Administered 2015-11-11: 1000 mL via INTRAVENOUS

## 2015-11-11 MED ORDER — MECLIZINE HCL 25 MG PO TABS
25.0000 mg | ORAL_TABLET | Freq: Once | ORAL | Status: AC
  Administered 2015-11-11: 25 mg via ORAL
  Filled 2015-11-11: qty 1

## 2015-11-11 NOTE — ED Triage Notes (Signed)
Pt unable to speak or read English so unable to complete visual acuity chart
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
## 2015-11-12 LAB — COMPREHENSIVE METABOLIC PANEL
ALBUMIN: 3.7 g/dL (ref 3.5–5.0)
ALK PHOS: 68 U/L (ref 38–126)
ALT: 10 U/L — ABNORMAL LOW (ref 14–54)
AST: 16 U/L (ref 15–41)
Anion gap: 7 (ref 5–15)
BILIRUBIN TOTAL: 0.4 mg/dL (ref 0.3–1.2)
BUN: 17 mg/dL (ref 6–20)
CALCIUM: 9.3 mg/dL (ref 8.9–10.3)
CO2: 24 mmol/L (ref 22–32)
CREATININE: 0.75 mg/dL (ref 0.44–1.00)
Chloride: 106 mmol/L (ref 101–111)
GFR calc Af Amer: >60 mL/min (ref >60)
GLUCOSE: 90 mg/dL (ref 65–99)
Potassium: 4.1 mmol/L (ref 3.5–5.1)
Sodium: 137 mmol/L (ref 135–145)
TOTAL PROTEIN: 7.4 g/dL (ref 6.5–8.1)

## 2015-11-12 LAB — LIPASE, BLOOD: LIPASE: 21 U/L (ref 11–51)

## 2015-11-12 MED ORDER — DIPHENHYDRAMINE HCL 50 MG/ML IJ SOLN
25.0000 mg | Freq: Once | INTRAMUSCULAR | Status: AC
  Administered 2015-11-12: 25 mg via INTRAVENOUS
  Filled 2015-11-12: qty 1

## 2015-11-12 MED ORDER — ONDANSETRON HCL 4 MG/2ML IJ SOLN
4.0000 mg | Freq: Once | INTRAMUSCULAR | Status: AC
  Administered 2015-11-12: 4 mg via INTRAVENOUS
  Filled 2015-11-12: qty 2

## 2015-11-12 MED ORDER — MECLIZINE HCL 25 MG PO TABS
25.0000 mg | ORAL_TABLET | Freq: Once | ORAL | Status: AC
  Administered 2015-11-12: 25 mg via ORAL
  Filled 2015-11-12: qty 1

## 2015-11-12 MED ORDER — ACETAMINOPHEN 500 MG PO TABS: 500.0000 mg | ORAL_TABLET | Freq: Four times a day (QID) | ORAL | 0 refills | Status: DC | PRN

## 2015-11-12 MED ORDER — METOCLOPRAMIDE HCL 5 MG/ML IJ SOLN
10.0000 mg | Freq: Once | INTRAMUSCULAR | Status: AC
  Administered 2015-11-12: 10 mg via INTRAVENOUS
  Filled 2015-11-12: qty 2

## 2015-11-12 MED ORDER — SODIUM CHLORIDE 0.9 % IV BOLUS (SEPSIS)
1000.0000 mL | Freq: Once | INTRAVENOUS | Status: AC
  Administered 2015-11-12: 1000 mL via INTRAVENOUS

## 2015-11-12 MED ORDER — KETOROLAC TROMETHAMINE 30 MG/ML IJ SOLN
30.0000 mg | Freq: Once | INTRAMUSCULAR | Status: AC
Start: 2015-11-12 — End: 2015-11-12
  Administered 2015-11-12: 30 mg via INTRAVENOUS
  Filled 2015-11-12: qty 1

## 2015-11-12 MED ORDER — MECLIZINE HCL 25 MG PO TABS: 25.0000 mg | ORAL_TABLET | Freq: Three times a day (TID) | ORAL | 1 refills | Status: DC | PRN

## 2015-11-12 NOTE — ED Notes (Signed)
Pt is still nauseated

## 2015-11-12 NOTE — ED Notes (Signed)
Pt continues to complain of dizziness and a headache

## 2015-11-12 NOTE — ED Notes (Signed)
Pt walked with assistance to the bathroom and says that she was dizzy when walking

## 2015-11-14 ENCOUNTER — Encounter: Payer: Self-pay | Admitting: Family Medicine

## 2015-11-14 ENCOUNTER — Ambulatory Visit: Payer: PRIVATE HEALTH INSURANCE | Attending: Family Medicine | Admitting: Family Medicine

## 2015-11-14 VITALS — BP 94/66 | HR 68 | Temp 98.3°F | Ht 59.5 in | Wt 156.8 lb

## 2015-11-14 DIAGNOSIS — R109 Unspecified abdominal pain: Secondary | ICD-10-CM | POA: Diagnosis present

## 2015-11-14 DIAGNOSIS — F339 Major depressive disorder, recurrent, unspecified: Secondary | ICD-10-CM | POA: Insufficient documentation

## 2015-11-14 DIAGNOSIS — F330 Major depressive disorder, recurrent, mild: Secondary | ICD-10-CM

## 2015-11-14 DIAGNOSIS — H8110 Benign paroxysmal vertigo, unspecified ear: Secondary | ICD-10-CM | POA: Insufficient documentation

## 2015-11-14 DIAGNOSIS — H8113 Benign paroxysmal vertigo, bilateral: Secondary | ICD-10-CM | POA: Diagnosis not present

## 2015-11-14 DIAGNOSIS — R110 Nausea: Secondary | ICD-10-CM

## 2015-11-14 DIAGNOSIS — Z9049 Acquired absence of other specified parts of digestive tract: Secondary | ICD-10-CM | POA: Insufficient documentation

## 2015-11-14 DIAGNOSIS — K219 Gastro-esophageal reflux disease without esophagitis: Secondary | ICD-10-CM | POA: Insufficient documentation

## 2015-11-14 DIAGNOSIS — E038 Other specified hypothyroidism: Secondary | ICD-10-CM

## 2015-11-14 DIAGNOSIS — K2970 Gastritis, unspecified, without bleeding: Secondary | ICD-10-CM | POA: Diagnosis not present

## 2015-11-14 DIAGNOSIS — K2990 Gastroduodenitis, unspecified, without bleeding: Secondary | ICD-10-CM | POA: Diagnosis not present

## 2015-11-14 LAB — TSH: TSH: 2.44 m[IU]/L

## 2015-11-14 MED ORDER — ONDANSETRON HCL 4 MG PO TABS: 4.0000 mg | ORAL_TABLET | Freq: Three times a day (TID) | ORAL | 0 refills | Status: DC | PRN

## 2015-11-14 MED ORDER — PANTOPRAZOLE SODIUM 40 MG PO TBEC: 40.0000 mg | DELAYED_RELEASE_TABLET | Freq: Every day | ORAL | 3 refills | Status: DC

## 2015-11-14 MED ORDER — FLUOXETINE HCL 20 MG PO TABS: 20.0000 mg | ORAL_TABLET | Freq: Every day | ORAL | 3 refills | Status: DC

## 2015-11-14 MED ORDER — PROMETHAZINE HCL 12.5 MG PO TABS
25.0000 mg | ORAL_TABLET | Freq: Once | ORAL | Status: AC
  Administered 2015-11-14: 25 mg via ORAL

## 2015-11-14 MED FILL — ?PANTOPRAZOLE SOD DR 40MG: 40 MG | 30 days supply | Qty: 30 | Fill #0

## 2015-11-14 MED FILL — ?ONDANSETRON HCL 4 MG TABLE: 4 | 3 days supply | Qty: 20 | Fill #0

## 2015-11-14 MED FILL — FLUoxetine HCL 20 MG CAPS: 20 | 30 days supply | Qty: 30 | Fill #0
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
## 2015-11-14 NOTE — Patient Instructions (Signed)

## 2015-11-14 NOTE — Progress Notes (Signed)
Medication refills

## 2015-11-15 ENCOUNTER — Telehealth: Payer: Self-pay

## 2015-11-15 ENCOUNTER — Other Ambulatory Visit: Payer: Self-pay | Admitting: Family Medicine

## 2015-11-15 MED ORDER — LEVOTHYROXINE SODIUM 100 MCG PO TABS: 100.0000 ug | ORAL_TABLET | Freq: Every day | ORAL | 2 refills | Status: DC

## 2015-11-15 NOTE — Telephone Encounter (Signed)
-----   Message from Arnoldo Morale, MD sent at 11/15/2015  2:01 PM EDT ----- Please inform the patient that labs are normal. Thank you.

## 2015-11-15 NOTE — Telephone Encounter (Signed)
Writer called patient who then returned writers call.  Writer discussed labs with patient who stated understanding.

## 2015-11-15 NOTE — Telephone Encounter (Signed)
Writer called patient through [employer] who LVM for patient that Probation officer was trying to get a hold of her to discuss labs.

## 2015-11-21 MED FILL — ?LEVOTHYROXINE 100 MCG TAB: 100 | 30 days supply | Qty: 30 | Fill #0

## 2015-11-29 ENCOUNTER — Ambulatory Visit: Payer: PRIVATE HEALTH INSURANCE | Attending: Family Medicine | Admitting: Family Medicine

## 2015-11-29 ENCOUNTER — Encounter: Payer: Self-pay | Admitting: Family Medicine

## 2015-11-29 VITALS — BP 92/63 | HR 74 | Temp 98.3°F | Ht 60.0 in | Wt 156.2 lb

## 2015-11-29 DIAGNOSIS — E039 Hypothyroidism, unspecified: Secondary | ICD-10-CM | POA: Diagnosis not present

## 2015-11-29 DIAGNOSIS — Z9049 Acquired absence of other specified parts of digestive tract: Secondary | ICD-10-CM | POA: Diagnosis not present

## 2015-11-29 DIAGNOSIS — R059 Cough, unspecified: Secondary | ICD-10-CM

## 2015-11-29 DIAGNOSIS — R51 Headache: Secondary | ICD-10-CM | POA: Insufficient documentation

## 2015-11-29 DIAGNOSIS — G44221 Chronic tension-type headache, intractable: Secondary | ICD-10-CM

## 2015-11-29 DIAGNOSIS — R110 Nausea: Secondary | ICD-10-CM | POA: Diagnosis not present

## 2015-11-29 DIAGNOSIS — R05 Cough: Secondary | ICD-10-CM | POA: Diagnosis not present

## 2015-11-29 MED ORDER — TOPIRAMATE 50 MG PO TABS
50.0000 mg | ORAL_TABLET | Freq: Two times a day (BID) | ORAL | 1 refills | Status: DC
Start: 2015-11-29 — End: 2019-06-03

## 2015-11-29 MED ORDER — CETIRIZINE HCL 10 MG PO TABS: 10.0000 mg | ORAL_TABLET | Freq: Every day | ORAL | 1 refills | Status: DC

## 2015-11-29 MED ORDER — BUTALBITAL-APAP-CAFFEINE 50-325-40 MG PO TABS: 1.0000 | ORAL_TABLET | Freq: Four times a day (QID) | ORAL | 0 refills | Status: AC | PRN

## 2015-11-29 MED FILL — ?CETIRIZINE HCL 10 MG TABLE: 10 | 30 days supply | Qty: 30 | Fill #0

## 2015-11-29 MED FILL — BUTALB-ACETAMIN-CAFF 50-325: 50-325-40 | 10 days supply | Qty: 30 | Fill #0

## 2015-11-29 MED FILL — TOPIRAMATE 50 MG TABLET: 50 | 30 days supply | Qty: 60 | Fill #0
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
## 2015-12-05 ENCOUNTER — Ambulatory Visit: Payer: Self-pay | Attending: Family Medicine | Admitting: Family Medicine

## 2015-12-05 ENCOUNTER — Encounter: Payer: Self-pay | Admitting: Family Medicine

## 2015-12-05 VITALS — BP 108/74 | HR 71 | Temp 98.0°F | Ht 61.0 in | Wt 158.4 lb

## 2015-12-05 DIAGNOSIS — R400 Somnolence: Secondary | ICD-10-CM | POA: Insufficient documentation

## 2015-12-05 DIAGNOSIS — G43809 Other migraine, not intractable, without status migrainosus: Secondary | ICD-10-CM

## 2015-12-05 DIAGNOSIS — E038 Other specified hypothyroidism: Secondary | ICD-10-CM

## 2015-12-05 DIAGNOSIS — M791 Myalgia: Secondary | ICD-10-CM | POA: Insufficient documentation

## 2015-12-05 DIAGNOSIS — F329 Major depressive disorder, single episode, unspecified: Secondary | ICD-10-CM | POA: Insufficient documentation

## 2015-12-05 DIAGNOSIS — D259 Leiomyoma of uterus, unspecified: Secondary | ICD-10-CM | POA: Insufficient documentation

## 2015-12-05 DIAGNOSIS — G43909 Migraine, unspecified, not intractable, without status migrainosus: Secondary | ICD-10-CM | POA: Insufficient documentation

## 2015-12-05 DIAGNOSIS — M7918 Myalgia, other site: Secondary | ICD-10-CM

## 2015-12-05 DIAGNOSIS — Z9049 Acquired absence of other specified parts of digestive tract: Secondary | ICD-10-CM | POA: Insufficient documentation

## 2015-12-05 DIAGNOSIS — E039 Hypothyroidism, unspecified: Secondary | ICD-10-CM | POA: Insufficient documentation
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
## 2015-12-05 NOTE — Progress Notes (Signed)
Headaches better- sleeping alot

## 2015-12-26 MED FILL — ?LEVOTHYROXINE 100 MCG TAB: 100 | 30 days supply | Qty: 30 | Fill #1

## 2016-01-30 MED FILL — ?LEVOTHYROXINE 100 MCG TAB: 100 | 30 days supply | Qty: 30 | Fill #2

## 2016-02-03 ENCOUNTER — Encounter: Payer: Self-pay | Admitting: Internal Medicine

## 2016-03-09 MED FILL — ?LEVOTHYROXINE 100 MCG TAB: 100 | 30 days supply | Qty: 30 | Fill #2

## 2016-03-12 ENCOUNTER — Ambulatory Visit: Payer: Self-pay | Admitting: Internal Medicine

## 2016-04-14 ENCOUNTER — Emergency Department (HOSPITAL_COMMUNITY)
Admission: EM | Admit: 2016-04-14 | Discharge: 2016-04-14 | Disposition: A | Discharge status: Home / Self Care | Payer: Self-pay | Attending: Physician Assistant | Admitting: Physician Assistant

## 2016-04-14 ENCOUNTER — Encounter (HOSPITAL_COMMUNITY): Payer: Self-pay | Admitting: Neurology

## 2016-04-14 ENCOUNTER — Emergency Department (HOSPITAL_COMMUNITY): Payer: Self-pay

## 2016-04-14 DIAGNOSIS — E039 Hypothyroidism, unspecified: Secondary | ICD-10-CM | POA: Insufficient documentation

## 2016-04-14 DIAGNOSIS — R1030 Lower abdominal pain, unspecified: Secondary | ICD-10-CM | POA: Insufficient documentation

## 2016-04-14 DIAGNOSIS — R197 Diarrhea, unspecified: Secondary | ICD-10-CM | POA: Insufficient documentation

## 2016-04-14 LAB — COMPREHENSIVE METABOLIC PANEL
ALBUMIN: 3.6 g/dL (ref 3.5–5.0)
ALT: 17 U/L (ref 14–54)
AST: 21 U/L (ref 15–41)
Alkaline Phosphatase: 85 U/L (ref 38–126)
Anion gap: 4 — ABNORMAL LOW (ref 5–15)
BILIRUBIN TOTAL: 0.3 mg/dL (ref 0.3–1.2)
BUN: 16 mg/dL (ref 6–20)
CHLORIDE: 108 mmol/L (ref 101–111)
CO2: 22 mmol/L (ref 22–32)
CREATININE: 0.76 mg/dL (ref 0.44–1.00)
Calcium: 8.9 mg/dL (ref 8.9–10.3)
GFR calc Af Amer: >60 mL/min (ref >60)
GLUCOSE: 108 mg/dL — AB (ref 65–99)
Potassium: 4.1 mmol/L (ref 3.5–5.1)
Sodium: 134 mmol/L — ABNORMAL LOW (ref 135–145)
TOTAL PROTEIN: 7.1 g/dL (ref 6.5–8.1)

## 2016-04-14 LAB — CBC WITH DIFFERENTIAL/PLATELET
Basophils Absolute: 0 10*3/uL (ref 0.0–0.1)
Basophils Relative: 1 %
EOS ABS: 0.2 10*3/uL (ref 0.0–0.7)
Eosinophils Relative: 3 %
HCT: 35.8 % — ABNORMAL LOW (ref 36.0–46.0)
Hemoglobin: 11.7 g/dL — ABNORMAL LOW (ref 12.0–15.0)
LYMPHS ABS: 1.3 10*3/uL (ref 0.7–4.0)
LYMPHS PCT: 20 %
MCH: 27.7 pg (ref 26.0–34.0)
MCHC: 32.7 g/dL (ref 30.0–36.0)
MCV: 84.6 fL (ref 78.0–100.0)
MONO ABS: 0.3 10*3/uL (ref 0.1–1.0)
Monocytes Relative: 5 %
Neutro Abs: 4.6 10*3/uL (ref 1.7–7.7)
Neutrophils Relative %: 71 %
PLATELETS: 219 10*3/uL (ref 150–400)
RBC: 4.23 MIL/uL (ref 3.87–5.11)
RDW: 14.4 % (ref 11.5–15.5)
WBC: 6.4 10*3/uL (ref 4.0–10.5)

## 2016-04-14 LAB — LIPASE, BLOOD: LIPASE: 14 U/L (ref 11–51)

## 2016-04-14 LAB — URINALYSIS, ROUTINE W REFLEX MICROSCOPIC
BACTERIA UA: NONE SEEN
Bilirubin Urine: NEGATIVE
Glucose, UA: NEGATIVE mg/dL
Ketones, ur: NEGATIVE mg/dL
Leukocytes, UA: NEGATIVE
Nitrite: NEGATIVE
PROTEIN: NEGATIVE mg/dL
SPECIFIC GRAVITY, URINE: 1.01 (ref 1.005–1.030)
Squamous Epithelial / LPF: NONE SEEN
WBC UA: NONE SEEN WBC/hpf (ref 0–5)
pH: 7 (ref 5.0–8.0)

## 2016-04-14 LAB — I-STAT BETA HCG BLOOD, ED (MC, WL, AP ONLY): I-stat hCG, quantitative: <5 m[IU]/mL (ref <5)

## 2016-04-14 MED ORDER — SODIUM CHLORIDE 0.9 % IV BOLUS (SEPSIS)
1000.0000 mL | Freq: Once | INTRAVENOUS | Status: AC
  Administered 2016-04-14: 1000 mL via INTRAVENOUS

## 2016-04-14 MED ORDER — IOPAMIDOL (ISOVUE-300) INJECTION 61%
INTRAVENOUS | Status: AC
  Administered 2016-04-14: 100 mL
  Filled 2016-04-14: qty 100

## 2016-04-14 MED ORDER — ONDANSETRON HCL 4 MG/2ML IJ SOLN
4.0000 mg | Freq: Once | INTRAMUSCULAR | Status: AC
  Administered 2016-04-14: 4 mg via INTRAVENOUS
  Filled 2016-04-14: qty 2

## 2016-04-14 NOTE — ED Notes (Signed)
Pt given water and ginger ale for PO challenge. 

## 2016-04-14 NOTE — ED Provider Notes (Signed)
[facility] DEPT Provider Note   CSN: [id number] Arrival date & time: 04/14/16  2841     History   Chief Complaint Chief Complaint  Patient presents with  . Diarrhea  . Abdominal Cramping    HPI Ellington Smits is a 46 y.o. female.  HPI   Pt is a 46 yo female with diarrhea since last night. Vomited X2.  No fevers. No recent travel or abx. No blood in vomit or diarrea.  Tried peptobismol which only helped a little. Mild abominal cramping.   Used [employer] administrator.   Past Medical History:  Diagnosis Date  . Chronic headaches   . Dyslipidemia   . Thyroid disease   . Uterine fibroid   . Vertigo     Patient Active Problem List   Diagnosis Date Noted  . Headache 12/05/2015  . Migraine 12/05/2015  . Depression 03/09/2015  . Gastritis and gastroduodenitis 03/09/2015  . Dyslipidemia 06/16/2013  . Hypothyroidism 05/21/2013    Past Surgical History:  Procedure Laterality Date  . CHOLECYSTECTOMY      OB History    No data available       Home Medications    Prior to Admission medications   Medication Sig Start Date End Date Taking? Authorizing Provider  acetaminophen (TYLENOL) 500 MG tablet Take 1 tablet (500 mg total) by mouth every 6 (six) hours as needed for mild pain or moderate pain. 11/12/15   Waynetta Pean, PA-C  butalbital-acetaminophen-caffeine (FIORICET, ESGIC) [phone] MG tablet Take 1 tablet by mouth every 6 (six) hours as needed for headache. 11/29/15 11/28/16  Arnoldo Morale, MD  cetirizine (ZYRTEC) 10 MG tablet Take 1 tablet (10 mg total) by mouth daily. Patient not taking: Reported on 12/05/2015 11/29/15   Arnoldo Morale, MD  FLUoxetine (PROZAC) 20 MG tablet Take 1 tablet (20 mg total) by mouth daily. 11/14/15   Arnoldo Morale, MD  levothyroxine (SYNTHROID, LEVOTHROID) 100 MCG tablet Take 1 tablet (100 mcg total) by mouth daily. 11/15/15   Arnoldo Morale, MD  meclizine (ANTIVERT) 25 MG tablet Take 1 tablet (25 mg total) by mouth 3 (three) times daily as  needed for dizziness. 11/12/15   Waynetta Pean, PA-C  ondansetron (ZOFRAN) 4 MG tablet Take 1 tablet (4 mg total) by mouth every 8 (eight) hours as needed for nausea or vomiting. 11/14/15   Arnoldo Morale, MD  pantoprazole (PROTONIX) 40 MG tablet Take 1 tablet (40 mg total) by mouth daily. Patient not taking: Reported on 12/05/2015 11/14/15   Arnoldo Morale, MD  topiramate (TOPAMAX) 50 MG tablet Take 1 tablet (50 mg total) by mouth 2 (two) times daily. 11/29/15   Arnoldo Morale, MD    Family History Family History  Problem Relation Age of Onset  . Diabetes Mother   . Hypertension Mother   . Diabetes Father   . Hypertension Father   . Heart disease Brother     Social History Social History  Substance Use Topics  . Smoking status: Never Smoker  . Smokeless tobacco: Never Used  . Alcohol use No     Allergies   Patient has no known allergies.   Review of Systems Review of Systems  Constitutional: Positive for chills. Negative for fever.  Respiratory: Negative for cough.   Gastrointestinal: Positive for abdominal pain, diarrhea and vomiting.     Physical Exam Updated Vital Signs BP 131/65 (BP Location: Right Arm)   Pulse 70   Temp 98.2 F (36.8 C) (Oral)   Resp 20   Wt 143 lb  4.8 oz (65 kg)   LMP 04/07/2016   SpO2 100%   BMI 27.08 kg/m   Physical Exam  Constitutional: She is oriented to person, place, and time. She appears well-developed and well-nourished.  HENT:  Head: Normocephalic and atraumatic.  Eyes: Right eye exhibits no discharge.  Cardiovascular: Normal rate.   Pulmonary/Chest: Effort normal and breath sounds normal.  Abdominal: Soft. There is tenderness.  Neurological: She is oriented to person, place, and time.  Skin: Skin is warm and dry. She is not diaphoretic.  Psychiatric: She has a normal mood and affect.  Nursing note and vitals reviewed.    ED Treatments / Results  Labs (all labs ordered are listed, but only abnormal results are  displayed) Labs Reviewed  COMPREHENSIVE METABOLIC PANEL  CBC WITH DIFFERENTIAL/PLATELET  LIPASE, BLOOD  I-STAT BETA HCG BLOOD, ED ([facility], [facility], [facility] ONLY)    EKG  EKG Interpretation None       Radiology No results found.  Procedures Procedures (including critical care time)  Medications Ordered in ED Medications  ondansetron (ZOFRAN) injection 4 mg (not administered)  sodium chloride 0.9 % bolus 1,000 mL (not administered)     Initial Impression / Assessment and Plan / ED Course  I have reviewed the triage vital signs and the nursing notes.  Pertinent labs & imaging results that were available during my care of the patient were reviewed by me and considered in my medical decision making (see chart for details).    Patient is a well-appearing 46 year old Arabic seen drinking female presenting with less than 12 hours of diarrhea. Patient reports several episodes of diarrhea well as well as 2 episodes of vomiting. Patient denies any travel, any antibitoics. I think this is likely a virus. However she has tenderness on exams will do CT abdomen and pelvis.  Neg CT, normal labs. Will have her stay hydrated at home ad return as needed.   Final Clinical Impressions(s) / ED Diagnoses   Final diagnoses:  None    New Prescriptions New Prescriptions   No medications on file     Calieb Lichtman Julio Alm, MD 04/14/16 1536

## 2016-04-14 NOTE — ED Notes (Signed)
Patient transported to CT 

## 2016-04-14 NOTE — ED Triage Notes (Signed)
Per translator line she is arabic speaking, diarrhea, is really affecting me. It has been happening 15 times since 6 pm yesterday, vomiting x 2. Is on her period at currently. This should be the last day of her period, and blood should be light, but she is still having blood. There is no rectal bleeding she says. She has lower abd pain, that prevents her from sleeping. The pain started after she was pushing and squeezing from diarrhea. Denies sick contacts, no new foods or chance of bad food. This actually all started when she was grocery shopping, went home to cook, ate apple and grape. Then it all started. She feels weak now from diarrhea, also lower abd pain. 5/10 pain, goes up to 6 sometimes. Feels crampy like period cramps.

## 2016-04-14 NOTE — Discharge Instructions (Signed)
We cannot find any reason for diarrhea today. CAT scan looks normal and her labs are reassuring. Please drink plenty of fluids at home. Please bring a stool sample to your primary care physician if not better in 24-48 hours. Please return with any concerns or if you have trouble staying hydrated at home.

## 2016-04-14 NOTE — ED Triage Notes (Signed)
Patient requests to have only female caregivers. Charge RN informed.

## 2016-04-16 ENCOUNTER — Ambulatory Visit: Payer: Self-pay | Admitting: Internal Medicine

## 2016-04-20 ENCOUNTER — Other Ambulatory Visit: Payer: Self-pay | Admitting: Family Medicine

## 2016-04-20 DIAGNOSIS — E039 Hypothyroidism, unspecified: Secondary | ICD-10-CM

## 2016-04-23 ENCOUNTER — Other Ambulatory Visit: Payer: Self-pay | Admitting: Family Medicine

## 2016-04-23 DIAGNOSIS — E038 Other specified hypothyroidism: Secondary | ICD-10-CM

## 2016-04-23 DIAGNOSIS — E039 Hypothyroidism, unspecified: Secondary | ICD-10-CM

## 2016-04-23 MED ORDER — LEVOTHYROXINE SODIUM 100 MCG PO TABS: 100.0000 ug | ORAL_TABLET | Freq: Every day | ORAL | 5 refills | Status: DC

## 2016-04-23 MED FILL — ?LEVOTHYROXINE 100 MCG TAB: 100 | 30 days supply | Qty: 30 | Fill #0

## 2016-05-25 MED FILL — ?LEVOTHYROXINE 100 MCG TAB: 100 | 30 days supply | Qty: 30 | Fill #1

## 2016-05-28 ENCOUNTER — Ambulatory Visit: Payer: Self-pay | Admitting: Internal Medicine

## 2016-07-16 MED FILL — ?LEVOTHYROXINE 100 MCG TAB: 100 | 30 days supply | Qty: 30 | Fill #2

## 2016-08-16 ENCOUNTER — Encounter: Payer: Self-pay | Admitting: Pharmacist

## 2016-08-16 MED FILL — ?LEVOTHYROXINE 100 MCG TAB: 100 | 30 days supply | Qty: 30 | Fill #3

## 2016-08-16 NOTE — Progress Notes (Signed)
Received message from [facility] that levothyroxine NDC needs to be changed. It is a narrow therapeutic drug - approved change and requested that patient be instructed to follow up for TSH in 6 weeks.

## 2016-10-10 ENCOUNTER — Emergency Department (HOSPITAL_COMMUNITY): Payer: Self-pay

## 2016-10-10 ENCOUNTER — Encounter (HOSPITAL_COMMUNITY): Payer: Self-pay | Admitting: Emergency Medicine

## 2016-10-10 ENCOUNTER — Emergency Department (HOSPITAL_COMMUNITY)
Admission: EM | Admit: 2016-10-10 | Discharge: 2016-10-10 | Disposition: A | Discharge status: Home / Self Care | Payer: Self-pay | Attending: Emergency Medicine | Admitting: Emergency Medicine

## 2016-10-10 DIAGNOSIS — R102 Pelvic and perineal pain: Secondary | ICD-10-CM | POA: Insufficient documentation

## 2016-10-10 DIAGNOSIS — R112 Nausea with vomiting, unspecified: Secondary | ICD-10-CM | POA: Insufficient documentation

## 2016-10-10 DIAGNOSIS — R197 Diarrhea, unspecified: Secondary | ICD-10-CM | POA: Insufficient documentation

## 2016-10-10 DIAGNOSIS — D259 Leiomyoma of uterus, unspecified: Secondary | ICD-10-CM | POA: Insufficient documentation

## 2016-10-10 DIAGNOSIS — Z79899 Other long term (current) drug therapy: Secondary | ICD-10-CM | POA: Insufficient documentation

## 2016-10-10 DIAGNOSIS — E039 Hypothyroidism, unspecified: Secondary | ICD-10-CM | POA: Insufficient documentation

## 2016-10-10 LAB — URINALYSIS, ROUTINE W REFLEX MICROSCOPIC
BILIRUBIN URINE: NEGATIVE
GLUCOSE, UA: NEGATIVE mg/dL
Ketones, ur: 5 mg/dL — AB
NITRITE: NEGATIVE
PH: 8 (ref 5.0–8.0)
PROTEIN: 30 mg/dL — AB
SPECIFIC GRAVITY, URINE: 1.011 (ref 1.005–1.030)
SQUAMOUS EPITHELIAL / LPF: NONE SEEN

## 2016-10-10 LAB — COMPREHENSIVE METABOLIC PANEL
ALK PHOS: 115 U/L (ref 38–126)
ALT: 22 U/L (ref 14–54)
AST: 27 U/L (ref 15–41)
Albumin: 3.9 g/dL (ref 3.5–5.0)
Anion gap: 12 (ref 5–15)
BILIRUBIN TOTAL: 0.6 mg/dL (ref 0.3–1.2)
BUN: 7 mg/dL (ref 6–20)
CALCIUM: 9.4 mg/dL (ref 8.9–10.3)
CO2: 17 mmol/L — ABNORMAL LOW (ref 22–32)
CREATININE: 0.83 mg/dL (ref 0.44–1.00)
Chloride: 106 mmol/L (ref 101–111)
GFR calc Af Amer: >60 mL/min (ref >60)
Glucose, Bld: 106 mg/dL — ABNORMAL HIGH (ref 65–99)
Potassium: 3.9 mmol/L (ref 3.5–5.1)
Sodium: 135 mmol/L (ref 135–145)
TOTAL PROTEIN: 8 g/dL (ref 6.5–8.1)

## 2016-10-10 LAB — CBC
HCT: 39.5 % (ref 36.0–46.0)
Hemoglobin: 13.2 g/dL (ref 12.0–15.0)
MCH: 28.4 pg (ref 26.0–34.0)
MCHC: 33.4 g/dL (ref 30.0–36.0)
MCV: 84.9 fL (ref 78.0–100.0)
PLATELETS: 258 10*3/uL (ref 150–400)
RBC: 4.65 MIL/uL (ref 3.87–5.11)
RDW: 14.3 % (ref 11.5–15.5)
WBC: 9.3 10*3/uL (ref 4.0–10.5)

## 2016-10-10 LAB — LIPASE, BLOOD: LIPASE: 26 U/L (ref 11–51)

## 2016-10-10 LAB — HCG, QUANTITATIVE, PREGNANCY

## 2016-10-10 MED ORDER — IBUPROFEN 400 MG PO TABS
ORAL_TABLET | ORAL | Status: DC
Start: 2016-10-10 — End: 2016-10-10
  Filled 2016-10-10: qty 1

## 2016-10-10 MED ORDER — ONDANSETRON 4 MG PO TBDP
ORAL_TABLET | ORAL | Status: AC
  Filled 2016-10-10: qty 1

## 2016-10-10 MED ORDER — ONDANSETRON 4 MG PO TBDP
4.0000 mg | ORAL_TABLET | Freq: Once | ORAL | Status: AC
  Administered 2016-10-10: 4 mg via ORAL

## 2016-10-10 MED ORDER — SODIUM CHLORIDE 0.9 % IV BOLUS (SEPSIS)
1000.0000 mL | Freq: Once | INTRAVENOUS | Status: AC
  Administered 2016-10-10: 1000 mL via INTRAVENOUS

## 2016-10-10 MED ORDER — ONDANSETRON HCL 4 MG/2ML IJ SOLN
4.0000 mg | Freq: Once | INTRAMUSCULAR | Status: AC
  Administered 2016-10-10: 4 mg via INTRAVENOUS
  Filled 2016-10-10: qty 2

## 2016-10-10 MED ORDER — ONDANSETRON HCL 8 MG PO TABS: 8.0000 mg | ORAL_TABLET | Freq: Three times a day (TID) | ORAL | 0 refills | Status: DC | PRN

## 2016-10-10 MED ORDER — IBUPROFEN 400 MG PO TABS
400.0000 mg | ORAL_TABLET | Freq: Once | ORAL | Status: AC | PRN
  Administered 2016-10-10: 400 mg via ORAL

## 2016-10-10 NOTE — ED Notes (Signed)
PO fluids given

## 2016-10-10 NOTE — Discharge Instructions (Signed)
The testing today was reassuring.  There were no signs of serious problems, today.  We will provide a prescription for nausea medicine to use as needed.  Began with a clear liquid diet, then gradually advance to bland foods and a full diet over the next several days.  The ultrasound showed a small uterine fibroid, which does not appear to be causing any complications at this time, but may in the future become a problem.  Talk to your primary care provider about this finding and its implications.

## 2016-10-10 NOTE — ED Provider Notes (Signed)
[facility] DEPT Provider Note   CSN: [id number] Arrival date & time: 10/10/16  0054     History   Chief Complaint Chief Complaint  Patient presents with  . Abdominal Pain    HPI Kaitlyn Wise is a 46 y.o. female.  The patient was interviewed, using a Arabic interpreter, by telemetry.  The patient presents for evaluation of abdominal pain associated with nausea, vomiting and diarrhea.  Symptoms present for 2 days and are recurrent.  She had a prior similar problem in March 2018, and was evaluated for it at that time in the emergency department.  She has not seen her PCP, recently.  She is currently on her menses.  She has not seen blood in the emesis or stool.  She denies cough, shortness of breath, chest pain, vertigo, paresthesia, or focal weakness.  There are no other known modifying factors.   HPI  Past Medical History:  Diagnosis Date  . Chronic headaches   . Dyslipidemia   . Thyroid disease   . Uterine fibroid   . Vertigo     Patient Active Problem List   Diagnosis Date Noted  . Headache 12/05/2015  . Migraine 12/05/2015  . Depression 03/09/2015  . Gastritis and gastroduodenitis 03/09/2015  . Dyslipidemia 06/16/2013  . Hypothyroidism 05/21/2013    Past Surgical History:  Procedure Laterality Date  . CHOLECYSTECTOMY      OB History    No data available       Home Medications    Prior to Admission medications   Medication Sig Start Date End Date Taking? Authorizing Provider  acetaminophen (TYLENOL) 500 MG tablet Take 1 tablet (500 mg total) by mouth every 6 (six) hours as needed for mild pain or moderate pain. Patient not taking: Reported on 04/14/2016 11/12/15   Waynetta Pean, PA-C  butalbital-acetaminophen-caffeine (FIORICET, ESGIC) [phone] MG tablet Take 1 tablet by mouth every 6 (six) hours as needed for headache. Patient not taking: Reported on 04/14/2016 11/29/15 11/28/16  Arnoldo Morale, MD  cetirizine (ZYRTEC) 10 MG tablet Take 1 tablet  (10 mg total) by mouth daily. Patient not taking: Reported on 12/05/2015 11/29/15   Arnoldo Morale, MD  FLUoxetine (PROZAC) 20 MG tablet Take 1 tablet (20 mg total) by mouth daily. Patient not taking: Reported on 04/14/2016 11/14/15   Arnoldo Morale, MD  levothyroxine (SYNTHROID, LEVOTHROID) 100 MCG tablet Take 1 tablet (100 mcg total) by mouth daily. 04/23/16   Arnoldo Morale, MD  loperamide (IMODIUM A-D) 2 MG tablet Take 2 mg by mouth 4 (four) times daily as needed for diarrhea or loose stools.    [provider]  meclizine (ANTIVERT) 25 MG tablet Take 1 tablet (25 mg total) by mouth 3 (three) times daily as needed for dizziness. Patient not taking: Reported on 04/14/2016 11/12/15   Waynetta Pean, PA-C  ondansetron [facility]) 8 MG tablet Take 1 tablet (8 mg total) by mouth every 8 (eight) hours as needed for nausea or vomiting. 10/10/16   Daleen Bo, MD  pantoprazole (PROTONIX) 40 MG tablet Take 1 tablet (40 mg total) by mouth daily. Patient not taking: Reported on 12/05/2015 11/14/15   Arnoldo Morale, MD  topiramate (TOPAMAX) 50 MG tablet Take 1 tablet (50 mg total) by mouth 2 (two) times daily. Patient not taking: Reported on 04/14/2016 11/29/15   Arnoldo Morale, MD    Family History Family History  Problem Relation Age of Onset  . Diabetes Mother   . Hypertension Mother   . Diabetes Father   .  Hypertension Father   . Heart disease Brother     Social History Social History  Substance Use Topics  . Smoking status: Never Smoker  . Smokeless tobacco: Never Used  . Alcohol use No     Allergies   Patient has no known allergies.   Review of Systems Review of Systems  All other systems reviewed and are negative.    Physical Exam Updated Vital Signs BP (!) 113/57   Pulse (!) 57   Temp 98.3 F (36.8 C) (Oral)   Resp 18   Ht 5\' 4"  (1.626 m)   Wt 68 kg (150 lb)   SpO2 100%   BMI 25.75 kg/m   Physical Exam  Constitutional: She is oriented to person, place, and  time. She appears well-developed and well-nourished. No distress.  HENT:  Head: Normocephalic and atraumatic.  Eyes: Pupils are equal, round, and reactive to light. Conjunctivae and EOM are normal.  Neck: Normal range of motion and phonation normal. Neck supple.  Cardiovascular: Normal rate and regular rhythm.   Pulmonary/Chest: Effort normal and breath sounds normal. She exhibits no tenderness.  Abdominal: Soft. She exhibits no distension. There is tenderness (Suprapubic, mild). There is no guarding.  Musculoskeletal: Normal range of motion.  Neurological: She is alert and oriented to person, place, and time. She exhibits normal muscle tone.  Skin: Skin is warm and dry.  Psychiatric: She has a normal mood and affect. Her behavior is normal. Judgment and thought content normal.  Nursing note and vitals reviewed.    ED Treatments / Results  Labs (all labs ordered are listed, but only abnormal results are displayed) Labs Reviewed  COMPREHENSIVE METABOLIC PANEL - Abnormal; Notable for the following:       Result Value   CO2 17 (*)    Glucose, Bld 106 (*)    All other components within normal limits  URINALYSIS, ROUTINE W REFLEX MICROSCOPIC - Abnormal; Notable for the following:    Color, Urine RED (*)    APPearance CLOUDY (*)    Hgb urine dipstick LARGE (*)    Ketones, ur 5 (*)    Protein, ur 30 (*)    Leukocytes, UA SMALL (*)    Bacteria, UA RARE (*)    All other components within normal limits  LIPASE, BLOOD  CBC  HCG, QUANTITATIVE, PREGNANCY    EKG  EKG Interpretation None       Radiology US Transvaginal Non-ob  Result Date: 10/10/2016 CLINICAL DATA:  Left lower quadrant pain for 4 days. EXAM: TRANSABDOMINAL AND TRANSVAGINAL ULTRASOUND OF PELVIS TECHNIQUE: Both transabdominal and transvaginal ultrasound examinations of the pelvis were performed. Transabdominal technique was performed for global imaging of the pelvis including uterus, ovaries, adnexal regions, and  pelvic cul-de-sac. It was necessary to proceed with endovaginal exam following the transabdominal exam to visualize the ovaries. COMPARISON:  None FINDINGS: Uterus Measurements: 9.9 x 4.9 x 6.2 cm. There is a uterine fibroid measuring 2.1 x 2.1 x 2.2 cm. Endometrium Thickness: 13.9 mm.  No focal abnormality visualized. Right ovary Not visualized. Left ovary Measurements: 4.1 x 2.1 x 3.7 cm. There is a 2.9 x 2.1 x 2.7 cm cyst in the left ovary. Other findings No abnormal free fluid. IMPRESSION: 2.2 cm uterine fibroid. Normal left ovary with follicular cyst. Right ovary not visualized. Electronically Signed   By: Abelardo Diesel M.D.   On: 10/10/2016 09:44   US Pelvis Complete  Result Date: 10/10/2016 CLINICAL DATA:  Left lower quadrant pain for 4  days. EXAM: TRANSABDOMINAL AND TRANSVAGINAL ULTRASOUND OF PELVIS TECHNIQUE: Both transabdominal and transvaginal ultrasound examinations of the pelvis were performed. Transabdominal technique was performed for global imaging of the pelvis including uterus, ovaries, adnexal regions, and pelvic cul-de-sac. It was necessary to proceed with endovaginal exam following the transabdominal exam to visualize the ovaries. COMPARISON:  None FINDINGS: Uterus Measurements: 9.9 x 4.9 x 6.2 cm. There is a uterine fibroid measuring 2.1 x 2.1 x 2.2 cm. Endometrium Thickness: 13.9 mm.  No focal abnormality visualized. Right ovary Not visualized. Left ovary Measurements: 4.1 x 2.1 x 3.7 cm. There is a 2.9 x 2.1 x 2.7 cm cyst in the left ovary. Other findings No abnormal free fluid. IMPRESSION: 2.2 cm uterine fibroid. Normal left ovary with follicular cyst. Right ovary not visualized. Electronically Signed   By: Abelardo Diesel M.D.   On: 10/10/2016 09:44    Procedures Procedures (including critical care time)  Medications Ordered in ED Medications  ibuprofen (ADVIL,MOTRIN) 400 MG tablet (not administered)  ondansetron (ZOFRAN-ODT) 4 MG disintegrating tablet (not administered)    ibuprofen (ADVIL,MOTRIN) tablet 400 mg (400 mg Oral Given 10/10/16 0107)  ondansetron (ZOFRAN-ODT) disintegrating tablet 4 mg (4 mg Oral Given 10/10/16 0107)  ondansetron (ZOFRAN) injection 4 mg (4 mg Intravenous Given 10/10/16 0818)  sodium chloride 0.9 % bolus 1,000 mL (0 mLs Intravenous Stopped 10/10/16 1002)     Initial Impression / Assessment and Plan / ED Course  I have reviewed the triage vital signs and the nursing notes.  Pertinent labs & imaging results that were available during my care of the patient were reviewed by me and considered in my medical decision making (see chart for details).  Clinical Course as of Oct 10 1012  Wed Oct 10, 2016  1006 Not pregnant HCG, Beta Chain, Quant, S: <1 [EW]  1007 Normal WBC: 9.3 [EW]  1007 Normal Hemoglobin: 13.2 [EW]  1007 Normal Sodium: 135 [EW]  1007 Slightly low CO2: (!) 17 [EW]  1007 Slightly high Glucose: (!) 106 [EW]  1007 Elevated, but nonspecific in the face of current menses Hgb urine dipstick: (!) LARGE [EW]  1007 Small fibroid, unlikely to be causing abdominal/pelvic pain US Transvaginal Non-OB [EW]    Clinical Course User Index [EW] Daleen Bo, MD     Patient Vitals for the past 24 hrs:  BP Temp Temp src Pulse Resp SpO2 Height Weight  10/10/16 1000 (!) 113/57 - - (!) 57 18 100 % - -  10/10/16 0830 (!) 103/58 - - (!) 56 16 100 % - -  10/10/16 0800 107/67 - - (!) 58 18 97 % - -  10/10/16 0745 103/63 - - (!) 57 - 99 % - -  10/10/16 0721 - - - (!) 53 - 100 % - -  10/10/16 0720 107/60 - - - - - - -  10/10/16 0104 125/74 98.3 F (36.8 C) Oral 60 (!) 22 100 % - -  10/10/16 0102 - - - - - - 5\' 4"  (1.626 m) 68 kg (150 lb)    10:01 AM Reevaluation with update and discussion. After initial assessment and treatment, an updated evaluation reveals the patient is comfortable at this time.  At this time, the patient is able to communicate very well using the [place] language.  The patient states that she has had a similar  problem about every 6 months, since she was age 29.  Findings discussed with the patient, and her friend, all questions were answered.Richarda Blade  Final Clinical Impressions(s) / ED Diagnoses   Final diagnoses:  Pelvic pain  Nausea vomiting and diarrhea  Uterine leiomyoma, unspecified location    Nonspecific nausea vomiting and diarrhea.  Pelvic discomfort, is nonspecific.  The pelvic discomfort is recurrent.  Doubt serious bacterial infection, metabolic instability or impending vascular collapse.  Incidental uterine fibroid, patient will be informed, with paperwork and verbal instructions at discharge.  Nursing Notes Reviewed/ Care Coordinated Applicable Imaging Reviewed Interpretation of Laboratory Data incorporated into ED treatment  The patient appears reasonably screened and/or stabilized for discharge and I doubt any other medical condition or other [facility] requiring further screening, evaluation, or treatment in the ED at this time prior to discharge.  Plan: Home Medications-OTC analgesia, as needed; Home Treatments-gradually advance diet; return here if the recommended treatment, does not improve the symptoms; Recommended follow up-PCP, as needed and checkup in 1 week.   New Prescriptions New Prescriptions   ONDANSETRON (ZOFRAN) 8 MG TABLET    Take 1 tablet (8 mg total) by mouth every 8 (eight) hours as needed for nausea or vomiting.     Daleen Bo, MD 10/10/16 [phone]

## 2016-10-10 NOTE — ED Notes (Signed)
Information obtained through interpretor.

## 2016-10-10 NOTE — ED Triage Notes (Signed)
Translator used, pt reports lower abd pain with N/V/D. Pt unable to explain what it feels like, unable to state exactly when it started. Pt dry heaving in triage, zofran and motrin given

## 2016-10-10 NOTE — ED Notes (Signed)
Patient transported to Ultrasound 

## 2016-10-10 NOTE — ED Notes (Signed)
Pt and friend states they understands instruyctions. Home stable with steady gait.

## 2017-01-18 ENCOUNTER — Encounter: Payer: Self-pay | Admitting: Nurse Practitioner

## 2017-01-18 ENCOUNTER — Ambulatory Visit: Payer: Self-pay | Attending: Family Medicine | Admitting: Licensed Clinical Social Worker

## 2017-01-18 ENCOUNTER — Ambulatory Visit: Payer: BLUE CROSS/BLUE SHIELD | Attending: Nurse Practitioner | Admitting: Nurse Practitioner

## 2017-01-18 VITALS — BP 91/60 | HR 78 | Temp 98.4°F | Ht 60.24 in | Wt 149.6 lb

## 2017-01-18 DIAGNOSIS — E785 Hyperlipidemia, unspecified: Secondary | ICD-10-CM | POA: Insufficient documentation

## 2017-01-18 DIAGNOSIS — Z8249 Family history of ischemic heart disease and other diseases of the circulatory system: Secondary | ICD-10-CM | POA: Diagnosis not present

## 2017-01-18 DIAGNOSIS — F329 Major depressive disorder, single episode, unspecified: Secondary | ICD-10-CM | POA: Diagnosis not present

## 2017-01-18 DIAGNOSIS — Z833 Family history of diabetes mellitus: Secondary | ICD-10-CM | POA: Diagnosis not present

## 2017-01-18 DIAGNOSIS — F322 Major depressive disorder, single episode, severe without psychotic features: Secondary | ICD-10-CM

## 2017-01-18 DIAGNOSIS — Z9049 Acquired absence of other specified parts of digestive tract: Secondary | ICD-10-CM | POA: Insufficient documentation

## 2017-01-18 DIAGNOSIS — R45851 Suicidal ideations: Secondary | ICD-10-CM | POA: Diagnosis not present

## 2017-01-18 DIAGNOSIS — F332 Major depressive disorder, recurrent severe without psychotic features: Secondary | ICD-10-CM

## 2017-01-18 DIAGNOSIS — E038 Other specified hypothyroidism: Secondary | ICD-10-CM | POA: Insufficient documentation

## 2017-01-18 DIAGNOSIS — N920 Excessive and frequent menstruation with regular cycle: Secondary | ICD-10-CM | POA: Insufficient documentation

## 2017-01-18 DIAGNOSIS — F330 Major depressive disorder, recurrent, mild: Secondary | ICD-10-CM | POA: Insufficient documentation

## 2017-01-18 DIAGNOSIS — Z79899 Other long term (current) drug therapy: Secondary | ICD-10-CM | POA: Diagnosis not present

## 2017-01-18 MED ORDER — FLUOXETINE HCL 20 MG PO TABS: 20.0000 mg | ORAL_TABLET | Freq: Every day | ORAL | 1 refills | Status: DC

## 2017-01-18 NOTE — Patient Instructions (Addendum)
Living With Depression Everyone experiences occasional disappointment, sadness, and loss in their lives. When you are feeling down, blue, or sad for at least 2 weeks in a row, it may mean that you have depression. Depression can affect your thoughts and feelings, relationships, daily activities, and physical health. It is caused by changes in the way your brain functions. If you receive a diagnosis of depression, your health care provider will tell you which type of depression you have and what treatment options are available to you. If you are living with depression, there are ways to help you recover from it and also ways to prevent it from coming back. How to cope with lifestyle changes Coping with stress Stress is your body's reaction to life changes and events, both good and bad. Stressful situations may include:  Getting married.  The death of a spouse.  Losing a job.  Retiring.  Having a baby.  Stress can last just a few hours or it can be ongoing. Stress can play a major role in depression, so it is important to learn both how to cope with stress and how to think about it differently. Talk with your health care provider or a counselor if you would like to learn more about stress reduction. He or she may suggest some stress reduction techniques, such as:  Music therapy. This can include creating music or listening to music. Choose music that you enjoy and that inspires you.  Mindfulness-based meditation. This kind of meditation can be done while sitting or walking. It involves being aware of your normal breaths, rather than trying to control your breathing.  Centering prayer. This is a kind of meditation that involves focusing on a spiritual word or phrase. Choose a word, phrase, or sacred image that is meaningful to you and that brings you peace.  Deep breathing. To do this, expand your stomach and inhale slowly through your nose. Hold your breath for 3-5 seconds, then exhale  slowly, allowing your stomach muscles to relax.  Muscle relaxation. This involves intentionally tensing muscles then relaxing them.  Choose a stress reduction technique that fits your lifestyle and personality. Stress reduction techniques take time and practice to develop. Set aside 5-15 minutes a day to do them. Therapists can offer training in these techniques. The training may be covered by some insurance plans. Other things you can do to manage stress include:  Keeping a stress diary. This can help you learn what triggers your stress and ways to control your response.  Understanding what your limits are and saying no to requests or events that lead to a schedule that is too full.  Thinking about how you respond to certain situations. You may not be able to control everything, but you can control how you react.  Adding humor to your life by watching funny films or TV shows.  Making time for activities that help you relax and not feeling guilty about spending your time this way.  Medicines Your health care provider may suggest certain medicines if he or she feels that they will help improve your condition. Avoid using alcohol and other substances that may prevent your medicines from working properly (may interact). It is also important to:  Talk with your pharmacist or health care provider about all the medicines that you take, their possible side effects, and what medicines are safe to take together.  Make it your goal to take part in all treatment decisions (shared decision-making). This includes giving input on the side   effects of medicines. It is best if shared decision-making with your health care provider is part of your total treatment plan.  If your health care provider prescribes a medicine, you may not notice the full benefits of it for 4-8 weeks. Most people who are treated for depression need to be on medicine for at least 6-12 months after they feel better. If you are taking  medicines as part of your treatment, do not stop taking medicines without first talking to your health care provider. You may need to have the medicine slowly decreased (tapered) over time to decrease the risk of harmful side effects. Relationships Your health care provider may suggest family therapy along with individual therapy and drug therapy. While there may not be family problems that are causing you to feel depressed, it is still important to make sure your family learns as much as they can about your mental health. Having your family's support can help make your treatment successful. How to recognize changes in your condition Everyone has a different response to treatment for depression. Recovery from major depression happens when you have not had signs of major depression for two months. This may mean that you will start to:  Have more interest in doing activities.  Feel less hopeless than you did 2 months ago.  Have more energy.  Overeat less often, or have better or improving appetite.  Have better concentration.  Your health care provider will work with you to decide the next steps in your recovery. It is also important to recognize when your condition is getting worse. Watch for these signs:  Having fatigue or low energy.  Eating too much or too little.  Sleeping too much or too little.  Feeling restless, agitated, or hopeless.  Having trouble concentrating or making decisions.  Having unexplained physical complaints.  Feeling irritable, angry, or aggressive.  Get help as soon as you or your family members notice these symptoms coming back. How to get support and help from others How to talk with friends and family members about your condition Talking to friends and family members about your condition can provide you with one way to get support and guidance. Reach out to trusted friends or family members, explain your symptoms to them, and let them know that you are  working with a health care provider to treat your depression. Financial resources Not all insurance plans cover mental health care, so it is important to check with your insurance carrier. If paying for co-pays or counseling services is a problem, search for a local or county mental health care center. They may be able to offer public mental health care services at low or no cost when you are not able to see a private health care provider. If you are taking medicine for depression, you may be able to get the generic form, which may be less expensive. Some makers of prescription medicines also offer help to patients who cannot afford the medicines they need. Follow these instructions at home:  Get the right amount and quality of sleep.  Cut down on using caffeine, tobacco, alcohol, and other potentially harmful substances.  Try to exercise, such as walking or lifting small weights.  Take over-the-counter and prescription medicines only as told by your health care provider.  Eat a healthy diet that includes plenty of vegetables, fruits, whole grains, low-fat dairy products, and lean protein. Do not eat a lot of foods that are high in solid fats, added sugars, or salt.    Keep all follow-up visits as told by your health care provider. This is important. Contact a health care provider if:  You stop taking your antidepressant medicines, and you have any of these symptoms: ? Nausea. ? Headache. ? Feeling lightheaded. ? Chills and body aches. ? Not being able to sleep (insomnia).  You or your friends and family think your depression is getting worse. Get help right away if:  You have thoughts of hurting yourself or others. If you ever feel like you may hurt yourself or others, or have thoughts about taking your own life, get help right away. You can go to your nearest emergency department or call:  Your local emergency services (911 in the [place]  A suicide crisis helpline, such as the  National Suicide Prevention Lifeline at 1-800-273-8255. This is open 24-hours a day.  Summary  If you are living with depression, there are ways to help you recover from it and also ways to prevent it from coming back.  Work with your health care team to create a management plan that includes counseling, stress management techniques, and healthy lifestyle habits. This information is not intended to replace advice given to you by your health care provider. Make sure you discuss any questions you have with your health care provider. Document Released: 12/05/2015 Document Revised: 12/05/2015 Document Reviewed: 12/05/2015 Elsevier Interactive Patient Education  2018 Elsevier Inc.  

## 2017-01-18 NOTE — Progress Notes (Signed)
Assessment & Plan:  Kaitlyn Wise was seen today for establish care.  Diagnoses and all orders for this visit:  Current severe episode of major depressive disorder without psychotic features without prior episode ([place]) -     Cancel: Ambulatory referral to Psychiatry -     Ambulatory referral to Psychology -     Ambulatory referral to Psychiatry -     FLUoxetine (PROZAC) 20 MG tablet; Take 1 tablet (20 mg total) by mouth daily. In 2 weeks begin taking 2 tablets daily.  Other specified hypothyroidism -     TSH    Patient has been counseled on age-appropriate routine health concerns for screening and prevention. These are reviewed and up-to-date. Referrals have been placed accordingly. Immunizations are up-to-date or declined.    Subjective:   Chief Complaint  Patient presents with  . Establish Care    Patient is here to establish care. Patient stated she is having pain on her stomach and on her abdomen. Patient stated she's been crying for no reason. Patient stated she's not on any medication.   HPI  VRI was used to communicate directly with patient for the entire encounter including providing detailed patient instructions.  Kaitlyn Wise 47 y.o. female presents to office today to establish care as new patient. She has a history of hypothyroidism. She is very tearful today and reports she has stopped working several weeks ago due to depression. This is my first time seeing her today so I have not been following her for depression or her hypothyroidism.    Hypothyroidism Patient diagnosed with hypothyroidism several years ago. She states she has started taking synthroid that was given to her in [place]. She stopped taking the synthroid that was prescribed here in the [place] reportedly due to menorrhagia which she associated with synthroid. She declines restarting synthroid from our pharmacy today and reports she is not here for her thyroid she is here because she can't stop crying and had to stop  working due to her depression. She plans to continue to obtain her thyroid medication from [place]. I instructed her that it is unsafe to take any medications that have not been prescribed by her PCP. She verbalized understanding. She continues to decline my suggestion to have her synthroid refilled here today.    Depression She has a long standing history of depression. Reports it helped her mood for a few hours and then it stopped working so she decided to stop taking it.. She started prozac 2 years ago and stopped taking it 2 months ago. Her husband was killed in 2006 in [place]. She has suffered from depression since his death. She endorses decreased appetite, apathy and mood lability.  She endorses suicidal ideation but denies thoughts of harming herself currently.  She declines referral to behavioral health hospital at this time. She is very tearful and is requesting a referral to a psychiatrist for therapy and medication management. I did request for the on site clinical social worker to come speak with Kaitlyn Wise today regarding additional mental health resources.    Review of Systems  Constitutional: Negative for fever, malaise/fatigue and weight loss.  HENT: Negative.  Negative for nosebleeds.   Eyes: Negative.  Negative for blurred vision, double vision and photophobia.  Respiratory: Negative.  Negative for cough and shortness of breath.   Cardiovascular: Negative.  Negative for chest pain, palpitations and leg swelling.  Gastrointestinal: Negative.  Negative for abdominal pain, constipation, diarrhea, heartburn, nausea and vomiting.  Musculoskeletal: Negative.  Negative for myalgias.  Neurological: Negative.  Negative for dizziness, focal weakness, seizures and headaches.  Endo/Heme/Allergies: Negative for environmental allergies.  Psychiatric/Behavioral: Positive for depression and suicidal ideas. Negative for hallucinations, memory loss and substance abuse. The patient is not  nervous/anxious and does not have insomnia.     Past Medical History:  Diagnosis Date  . Dyslipidemia   . Uterine fibroid   . Vertigo     Past Surgical History:  Procedure Laterality Date  . CHOLECYSTECTOMY      Family History  Problem Relation Age of Onset  . Diabetes Mother   . Hypertension Mother   . Diabetes Father   . Hypertension Father   . Heart disease Brother     Social History Reviewed with no changes to be made today.   Outpatient Medications Prior to Visit  Medication Sig Dispense Refill  . levothyroxine (SYNTHROID, LEVOTHROID) 100 MCG tablet Take 1 tablet (100 mcg total) by mouth daily. (Patient not taking: Reported on 01/18/2017) 30 tablet 5  . loperamide (IMODIUM A-D) 2 MG tablet Take 2 mg by mouth 4 (four) times daily as needed for diarrhea or loose stools.    . topiramate (TOPAMAX) 50 MG tablet Take 1 tablet (50 mg total) by mouth 2 (two) times daily. (Patient not taking: Reported on 04/14/2016) 60 tablet 1  . acetaminophen (TYLENOL) 500 MG tablet Take 1 tablet (500 mg total) by mouth every 6 (six) hours as needed for mild pain or moderate pain. (Patient not taking: Reported on 04/14/2016) 30 tablet 0  . cetirizine (ZYRTEC) 10 MG tablet Take 1 tablet (10 mg total) by mouth daily. (Patient not taking: Reported on 12/05/2015) 30 tablet 1  . FLUoxetine (PROZAC) 20 MG tablet Take 1 tablet (20 mg total) by mouth daily. (Patient not taking: Reported on 04/14/2016) 30 tablet 3  . meclizine (ANTIVERT) 25 MG tablet Take 1 tablet (25 mg total) by mouth 3 (three) times daily as needed for dizziness. (Patient not taking: Reported on 04/14/2016) 30 tablet 1  . ondansetron (ZOFRAN) 8 MG tablet Take 1 tablet (8 mg total) by mouth every 8 (eight) hours as needed for nausea or vomiting. (Patient not taking: Reported on 01/18/2017) 20 tablet 0  . pantoprazole (PROTONIX) 40 MG tablet Take 1 tablet (40 mg total) by mouth daily. (Patient not taking: Reported on 12/05/2015) 30 tablet 3   No  facility-administered medications prior to visit.     No Known Allergies     Objective:    BP 91/60 (BP Location: Left Arm, Patient Position: Sitting, Cuff Size: Normal)   Pulse 78   Temp 98.4 F (36.9 C) (Oral)   Ht 5' 0.24" (1.53 m)   Wt 149 lb 9.6 oz (67.9 kg)   SpO2 100%   BMI 28.99 kg/m  Wt Readings from Last 3 Encounters:  01/18/17 149 lb 9.6 oz (67.9 kg)  10/10/16 150 lb (68 kg)  04/14/16 143 lb 4.8 oz (65 kg)    Physical Exam  Constitutional: She is oriented to person, place, and time. She appears well-developed and well-nourished. She is cooperative.  HENT:  Head: Normocephalic and atraumatic.  Eyes: EOM are normal.  Neck: Normal range of motion.  Cardiovascular: Normal rate, regular rhythm, normal heart sounds and intact distal pulses. Exam reveals no gallop and no friction rub.  No murmur heard. Pulmonary/Chest: Effort normal and breath sounds normal. No tachypnea. No respiratory distress. She has no decreased breath sounds. She has no wheezes. She has no rhonchi. She  has no rales. She exhibits no tenderness.  Abdominal: Soft. Bowel sounds are normal.  Musculoskeletal: Normal range of motion. She exhibits no edema.  Neurological: She is alert and oriented to person, place, and time. Coordination normal.  Skin: Skin is warm and dry.  Psychiatric: Her speech is normal and behavior is normal. Judgment and thought content normal. Her affect is labile. She exhibits a depressed mood.  Nursing note and vitals reviewed.      Patient has been counseled extensively about nutrition and exercise as well as the importance of adherence with medications and regular follow-up. The patient was given clear instructions to go to ER or return to medical center if symptoms don't improve, worsen or new problems develop. The patient verbalized understanding.   Follow-up: Return in about 3 months (around 04/18/2017).   Gildardo Pounds, FNP-BC [facility], [place]   01/20/2017, 9:05 PM

## 2017-01-18 NOTE — BH Specialist Note (Signed)
Integrated Behavioral Health Initial Visit  MRN: [id number] Name: Kaitlyn Wise  Number of [facility] Clinician visits:: 1/6 Session Start time: 11:00 AM  Session End time: 11:30 AM Total time: 30 minutes  Type of Service: [place] Interpretor:Yes.   Interpretor Name and Language: Marylene Land [phone] (Arabic)   Warm Hand Off Completed.       SUBJECTIVE: Kaitlyn Wise is a 47 y.o. female accompanied by self Patient was referred by NP Raul Del for depression. Patient reports the following symptoms/concerns: overwhelming feelings of sadness, no interest in doing things, withdrawn, low motivation, crying episodes, decreased appetite, and hx of suicidal ideations Duration of problem: 2014/04/26; Severity of problem: severe  OBJECTIVE: Mood: Dysphoric and Affect: Depressed and Tearful Risk of harm to self or others: No plan to harm self or others Pt has hx of SI; however, denies intent or plan to harm self or others. States that it goes against her spiritual belief system  LIFE CONTEXT: Family and Social: Pt is originally from [place]; however, resided in [place]. She moved to the [place] in 04/25/13. Pt's adult daughter and mother resides in [place]. Her husband passed away in 04-25-2004 School/Work: Pt has been unemployed for one month Self-Care: Pt shared that she has no motivation to exercise. She is open to participating in medication management and psychotherapy. No report of substance use Life Changes: Pt is experiencing depression triggered by grief and limited support system  GOALS ADDRESSED: Patient will: 1. Reduce symptoms of: anxiety and depression 2. Increase knowledge and/or ability of: coping skills and healthy habits  3. Demonstrate ability to: Increase adequate support systems for patient/family, Increase motivation to adhere to plan of care and Improve medication compliance  INTERVENTIONS: Interventions utilized: Solution-Focused Strategies,  Supportive Counseling, Psychoeducation and/or Health Education and Link to [employer]  Standardized Assessments completed: GAD-7 and PHQ 2&9  ASSESSMENT: Patient currently experiencing depression and anxiety triggered by grief and limited support system in the [place]. She reports overwhelming feelings of sadness, no interest in doing things, withdrawn, low motivation, crying episodes, decreased appetite, and hx of suicidal ideations. Pt denies intent or plan to harm self or others. States that it goes against her spiritual belief system.    Patient may benefit from psychoeducation, psychotherapy, and medication management. [place] educated pt on correlation between one's physical and mental health. Pt was informed of therapeutic interventions that can decrease symptoms when applied. Pt agreed to re-initiate medication management. She agreed to referral to psychiatrist and psychotherapy. LCSWA provided crisis intervention resources.   PLAN: 1. Follow up with behavioral health clinician on : Pt was encouraged to contact 911 and/or LCSWA if symptoms worsen or fail to improve to schedule behavioral appointments at [facility]. 2. Behavioral recommendations: LCSWA recommends that pt apply healthy coping skills discussed, comply with medication management, and follow up on referrals to psychiatry and psychotherapy. Pt is encouraged to utilize provided resources and schedule follow up appointment with LCSWA 3. Referral(s): [facility] (In Clinic), Psychiatrist and Counselor 4. "From scale of 1-10, how likely are you to follow plan?":   Rebekah [place], LCSW 01/19/16 3:11 PM

## 2017-01-19 LAB — TSH: TSH: 4.46 u[IU]/mL (ref 0.450–4.500)

## 2017-01-20 ENCOUNTER — Encounter: Payer: Self-pay | Admitting: Nurse Practitioner

## 2017-01-22 ENCOUNTER — Telehealth: Payer: Self-pay

## 2017-01-22 NOTE — Telephone Encounter (Signed)
-----   Message from Gildardo Pounds, NP sent at 01/20/2017  9:32 PM EST ----- Your TSH is nearing the high normal range. I would strongly suggest you stop taking synthroid that is manufactured in another country and start taking synthroid that is prescribed by this office or can be filled here at a local pharmacy.

## 2017-01-22 NOTE — Telephone Encounter (Signed)
CMA attempt to call patient regarding lab results. Patient did not answer.  Arabic interpreter: Rallah [phone] left a voicemail for patient to call back.   Communication letter will be send out.

## 2017-01-28 MED FILL — ?FLUOXETINE HCL 20MG TABLET: 20 | 30 days supply | Qty: 42 | Fill #0

## 2017-02-05 ENCOUNTER — Telehealth: Payer: Self-pay | Admitting: Family Medicine

## 2017-02-05 NOTE — Telephone Encounter (Signed)
Pt came in to check the status of her FMLA Paperwork Please follow up.

## 2017-02-05 NOTE — Telephone Encounter (Signed)
Will route to PCP 

## 2017-02-06 ENCOUNTER — Encounter: Payer: Self-pay | Admitting: Nurse Practitioner

## 2017-02-06 NOTE — Telephone Encounter (Signed)
She can pick it up today after it has been faxed into media.

## 2017-02-06 NOTE — Telephone Encounter (Signed)
I have not come across her FMLA paperwork.

## 2017-02-06 NOTE — Progress Notes (Signed)
FMLA forms completed

## 2017-02-08 NOTE — Telephone Encounter (Signed)
CMA called patient to pick up her FMLA paperwork.   Patient understood and stated her friend will pick it up for her.

## 2017-02-11 NOTE — Telephone Encounter (Signed)
NOTED

## 2017-02-20 ENCOUNTER — Ambulatory Visit (INDEPENDENT_AMBULATORY_CARE_PROVIDER_SITE_OTHER): Payer: BLUE CROSS/BLUE SHIELD | Admitting: Psychiatry

## 2017-02-20 DIAGNOSIS — F330 Major depressive disorder, recurrent, mild: Secondary | ICD-10-CM | POA: Diagnosis not present

## 2017-02-21 NOTE — Progress Notes (Signed)
Comprehensive Clinical Assessment (CCA) Note  02/21/2017 Kaitlyn Wise [id number]  Visit Diagnosis:      ICD-10-CM   1. Mild episode of recurrent major depressive disorder ([place]) F33.0       CCA Part One  Part One has been completed on paper by the patient.  (See scanned document in Chart Review)  CCA Part Two A  Intake/Chief Complaint:  CCA Intake With Chief Complaint CCA Part Two Date: 02/20/17 Chief Complaint/Presenting Problem: depression Patients Currently Reported Symptoms/Problems: for the last two years sad all of the time, doesnt want to talk or see anyone, doesnt want to shower as long as a week Collateral Involvement: interpreter was part of the session Individual's Strengths: likes her work very much, but coworkers became increasingly dissatisfied with her work Type of Services Patient Feels Are Needed: connection with community; grief counseling Initial Clinical Notes/Concerns: Husband was killed in [place] 68 years ago, 76 year old daughter is with your brother in law and refuses to allow her to see or talk  the daugher; She has been in [place] for 4 years, very [place] Symptoms Depression:  Depression: Difficulty Concentrating, Fatigue, Tearfulness, Increase/decrease in appetite, Change in energy/activity, Sleep (too much or little)(low energy, low appetite)  Mania:     Anxiety:   Anxiety: N/A  Psychosis:     Trauma:  Trauma: Re-experience of traumatic event(watched alot of killing in [place]. When husband was killed, she had to identify his body and he was defaced.)  Obsessions:  Obsessions: N/A  Compulsions:  Compulsions: N/A  Inattention:  Inattention: N/A  Hyperactivity/Impulsivity:  Hyperactivity/Impulsivity: N/A  Oppositional/Defiant Behaviors:  Oppositional/Defiant Behaviors: N/A  Borderline Personality:  Emotional Irregularity: N/A  Other Mood/Personality Symptoms:      Mental Status Exam Appearance and self-care  Stature:  Stature: Average  Weight:   Weight: Average weight  Clothing:  Clothing: Casual  Grooming:  Grooming: Normal  Cosmetic use:  Cosmetic Use: Age appropriate  Posture/gait:  Posture/Gait: Normal  Motor activity:  Motor Activity: Not Remarkable  Sensorium  Attention:  Attention: Normal  Concentration:  Concentration: Normal  Orientation:  Orientation: X5  Recall/memory:  Recall/Memory: Normal  Affect and Mood  Affect:  Affect: Appropriate  Mood:  Mood: Depressed  Relating  Eye contact:  Eye Contact: Normal  Facial expression:  Facial Expression: Depressed  Attitude toward examiner:  Attitude Toward Examiner: Cooperative  Thought and Language  Speech flow: Speech Flow: Normal  Thought content:  Thought Content: Appropriate to mood and circumstances  Preoccupation:     Hallucinations:     Organization:     Transport planner of Knowledge:  Fund of Knowledge: Average  Intelligence:  Intelligence: Average  Abstraction:  Abstraction: Functional  Judgement:  Judgement: Normal  Reality Testing:  Reality Testing: Realistic  Insight:  Insight: Good  Decision Making:  Decision Making: Normal  Social Functioning  Social Maturity:  Social Maturity: (P) Isolates  Social Judgement:  Social Judgement: Normal  Stress  Stressors:  Stressors: (P) Transitions, Work  Coping Ability:  Coping Ability: (P) Overwhelmed  Skill Deficits:     Supports:      Family and Psychosocial History: Family history Marital status: Widowed Are you sexually active?: No What is your sexual orientation?: heterosexual Does patient have children?: Yes How many children?: 1 How is patient's relationship with their children?: very good, but distant because her daughter lives in [place]  Childhood History:  Childhood History By whom was/is the patient raised?: Both parents  Description of patient's relationship with caregiver when they were a child: good with both parents Patient's description of current relationship with people who  raised him/her: good Does patient have siblings?: Yes Number of Siblings: 9 Description of patient's current relationship with siblings: 5 sisters, 4 brothers Did patient suffer any verbal/emotional/physical/sexual abuse as a child?: No Did patient suffer from severe childhood neglect?: No Has patient ever been sexually abused/assaulted/raped as an adolescent or adult?: No Was the patient ever a victim of a crime or a disaster?: No Witnessed domestic violence?: No Has patient been effected by domestic violence as an adult?: No  CCA Part Two B  Employment/Work Situation: Employment / Work Copywriter, advertising Employment situation: Employed 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 How long has patient been employed?: 3 years Patient's job has been impacted by current illness: Yes Describe how patient's job has been impacted: difficult to concentrat What is the longest time patient has a held a job?: current job Has patient ever been in the [employer]?: No Has patient ever served in combat?: No Did You Receive Any Psychiatric Treatment/Services While in Passenger transport manager?: No Are There Guns or Other Weapons in [place]?: No Are These Psychologist, educational?: Yes  Education: Education Did Physicist, medical?: Yes What Type of College Degree Do you Have?: BS in management What Was Your Major?: management  Religion: Religion/Spirituality Are You A Religious Person?: No How Might This Affect Treatment?: muslim, but not religious  Leisure/Recreation: Leisure / Recreation Leisure and Hobbies: nothing  Exercise/Diet: Exercise/Diet Do You Exercise?: No Have You Gained or Lost A Significant Amount of Weight in the Past Six Months?: Yes-Lost(has lost weight, but starting to gain weight back) Do You Follow a Special Diet?: No Do You Have Any Trouble Sleeping?: Yes Explanation of Sleeping Difficulties: about 3 hours a night  CCA Part Two C  Alcohol/Drug Use: Alcohol / Drug  Use Pain Medications: none Over the Counter: none History of alcohol / drug use?: No history of alcohol / drug abuse                      CCA Part Three  ASAM's:  Six Dimensions of Multidimensional Assessment  Dimension 1:  Acute Intoxication and/or Withdrawal Potential:     Dimension 2:  Biomedical Conditions and Complications:     Dimension 3:  Emotional, Behavioral, or Cognitive Conditions and Complications:     Dimension 4:  Readiness to Change:     Dimension 5:  Relapse, Continued use, or Continued Problem Potential:     Dimension 6:  Recovery/Living Environment:      Substance use Disorder (SUD)    Social Function:  Social Functioning Social Maturity: (P) Isolates Social Judgement: Normal  Stress:  Stress Stressors: (P) Transitions, Work Coping Ability: (P) Overwhelmed  Risk Assessment- Self-Harm Potential: Risk Assessment For Self-Harm Potential Thoughts of Self-Harm: (P) No current thoughts Method: (P) No plan Availability of Means: (P) No access/NA  Risk Assessment -Dangerous to Others Potential: Risk Assessment For Dangerous to Others Potential Method: (P) No Plan Availability of Means: (P) No access or NA Intent: (P) Vague intent or NA Notification Required: (P) No need or identified person  DSM5 Diagnoses: Patient Active Problem List   Diagnosis Date Noted  . Mild episode of recurrent major depressive disorder ([place]) 01/18/2017  . Migraine 12/05/2015  . Gastritis and gastroduodenitis 03/09/2015  . Dyslipidemia 06/16/2013  . Hypothyroidism 05/21/2013    Patient Centered Plan: Patient is on  the following Treatment Plan(s): Pt. To complete treatment plan with individual therapist.  Recommendations for Services/Supports/Treatments: Recommendations for Services/Supports/Treatments Recommendations For Services/Supports/Treatments: (P) Medication Management  Treatment Plan Summary: Seeking Arabic speaking therapist within one week of current  appointment. If unable to find an Arabic speaking therapist, Pt. To be scheduled with therapist in this office every two weeks with interpereter Florestine Avers, phone number [phone].    Referrals to Alternative Service(s): Referred to Alternative Service(s):   Place:   Date:   Time:    Referred to Alternative Service(s):   Place:   Date:   Time:    Referred to Alternative Service(s):   Place:   Date:   Time:    Referred to Alternative Service(s):   Place:   Date:   Time:     Nancie Neas

## 2017-02-21 NOTE — Addendum Note (Signed)
Addended by: Eloise Levels B on: 02/21/2017 03:44 PM   Modules accepted: Level of Service

## 2017-03-07 ENCOUNTER — Ambulatory Visit: Payer: Self-pay | Admitting: Family Medicine

## 2017-03-07 ENCOUNTER — Encounter (HOSPITAL_COMMUNITY): Payer: Self-pay | Admitting: Emergency Medicine

## 2017-03-07 ENCOUNTER — Ambulatory Visit (HOSPITAL_COMMUNITY)
Admission: EM | Admit: 2017-03-07 | Discharge: 2017-03-07 | Disposition: A | Discharge status: Home / Self Care | Payer: BLUE CROSS/BLUE SHIELD | Attending: Family Medicine | Admitting: Family Medicine

## 2017-03-07 DIAGNOSIS — Z79899 Other long term (current) drug therapy: Secondary | ICD-10-CM | POA: Diagnosis not present

## 2017-03-07 DIAGNOSIS — N390 Urinary tract infection, site not specified: Secondary | ICD-10-CM | POA: Insufficient documentation

## 2017-03-07 DIAGNOSIS — R300 Dysuria: Secondary | ICD-10-CM | POA: Diagnosis present

## 2017-03-07 LAB — POCT URINALYSIS DIP (DEVICE)
Bilirubin Urine: NEGATIVE
GLUCOSE, UA: NEGATIVE mg/dL
Ketones, ur: NEGATIVE mg/dL
Nitrite: NEGATIVE
PH: 6 (ref 5.0–8.0)
PROTEIN: 30 mg/dL — AB
Specific Gravity, Urine: 1.02 (ref 1.005–1.030)
UROBILINOGEN UA: 0.2 mg/dL (ref 0.0–1.0)

## 2017-03-07 LAB — POCT PREGNANCY, URINE: Preg Test, Ur: NEGATIVE

## 2017-03-07 MED ORDER — NITROFURANTOIN MONOHYD MACRO 100 MG PO CAPS: 100.0000 mg | ORAL_CAPSULE | Freq: Two times a day (BID) | ORAL | 0 refills | Status: AC

## 2017-03-07 NOTE — ED Provider Notes (Addendum)
[facility]    CSN: [id number] Arrival date & time: 03/07/17  1613     History   Chief Complaint Chief Complaint  Patient presents with  . Dysuria    HPI Kaitlyn Wise is a 47 y.o. female.   Florina presents with complaints of pain and frequency with urination as well as fevers for the past two weeks. Arabic interpreter used to collect history and physical. She states she was just remarried 1.68months ago after she had been widowed for 14 years. She had not been sexually active until remarried. States she developed pain with intercourse so she bought a numbing medication to try to help with that. That led to pain with urination, back pain and urinary frequency. She has been taking pyridium which has helped some. She states her last period was 12/27, had been regular. However since being sexually active her periods have become sporadic with just occasional spotting. Denies nausea, vomiting or diarrhea. Denies abdominal pain. She has some vaginal itching and clear vaginal discharge. No allergies to medications. Takes synthroid, has not been taking her medication for depression. Denies any previous similar illness.    ROS per HPI.       Past Medical History:  Diagnosis Date  . Dyslipidemia   . Uterine fibroid   . Vertigo     Patient Active Problem List   Diagnosis Date Noted  . Mild episode of recurrent major depressive disorder ([place]) 01/18/2017  . Migraine 12/05/2015  . Gastritis and gastroduodenitis 03/09/2015  . Dyslipidemia 06/16/2013  . Hypothyroidism 05/21/2013    Past Surgical History:  Procedure Laterality Date  . CHOLECYSTECTOMY      OB History    No data available       Home Medications    Prior to Admission medications   Medication Sig Start Date End Date Taking? Authorizing Provider  FLUoxetine (PROZAC) 20 MG tablet Take 1 tablet (20 mg total) by mouth daily. In 2 weeks begin taking 2 tablets daily. 01/18/17 04/18/17 Yes Gildardo Pounds, NP    levothyroxine (SYNTHROID, LEVOTHROID) 100 MCG tablet Take 1 tablet (100 mcg total) by mouth daily. 04/23/16  Yes Charlott Rakes, MD  phenazopyridine (PYRIDIUM) 97 MG tablet Take 97 mg by mouth 3 (three) times daily as needed for pain.   Yes [provider]  loperamide (IMODIUM A-D) 2 MG tablet Take 2 mg by mouth 4 (four) times daily as needed for diarrhea or loose stools.    [provider]  nitrofurantoin, macrocrystal-monohydrate, (MACROBID) 100 MG capsule Take 1 capsule (100 mg total) by mouth 2 (two) times daily for 5 days. 03/07/17 03/12/17  Zigmund Gottron, NP  topiramate (TOPAMAX) 50 MG tablet Take 1 tablet (50 mg total) by mouth 2 (two) times daily. Patient not taking: Reported on 04/14/2016 11/29/15   Charlott Rakes, MD    Family History Family History  Problem Relation Age of Onset  . Diabetes Mother   . Hypertension Mother   . Diabetes Father   . Hypertension Father   . Heart disease Brother     Social History Social History   Tobacco Use  . Smoking status: Never Smoker  . Smokeless tobacco: Never Used  Substance Use Topics  . Alcohol use: No  . Drug use: No     Allergies   Patient has no known allergies.   Review of Systems Review of Systems   Physical Exam Triage Vital Signs ED Triage Vitals  Enc Vitals Group  BP 03/07/17 1632 (!) 113/51     Pulse Rate 03/07/17 1632 86     Resp 03/07/17 1632 16     Temp 03/07/17 1632 97.9 F (36.6 C)     Temp Source 03/07/17 1632 Oral     SpO2 03/07/17 1632 100 %     Weight 03/07/17 1653 149 lb (67.6 kg)     Height --      Head Circumference --      Peak Flow --      Pain Score 03/07/17 1653 6     Pain Loc --      Pain Edu? --      Excl. in [place]? --    No data found.  Updated Vital Signs BP (!) 113/51 (BP Location: Right Arm)   Pulse 86   Temp 97.9 F (36.6 C) (Oral)   Resp 16   Wt 149 lb (67.6 kg)   LMP 01/10/2017   SpO2 100%   BMI 28.87 kg/m   Visual Acuity Right Eye Distance:    Left Eye Distance:   Bilateral Distance:    Right Eye Near:   Left Eye Near:    Bilateral Near:     Physical Exam  Constitutional: She is oriented to person, place, and time. She appears well-developed and well-nourished. No distress.  Cardiovascular: Normal rate, regular rhythm and normal heart sounds.  Pulmonary/Chest: Effort normal and breath sounds normal.  Abdominal: Soft. There is tenderness in the suprapubic area. There is no CVA tenderness.  Genitourinary: Rectum normal, vagina normal and uterus normal. Cervix exhibits no motion tenderness.  Genitourinary Comments: Generalized discomfort with pelvic exam; thin white discharge without odor noted; without bleeding  Neurological: She is alert and oriented to person, place, and time.  Skin: Skin is warm and dry.     UC Treatments / Results  Labs (all labs ordered are listed, but only abnormal results are displayed) Labs Reviewed  POCT URINALYSIS DIP (DEVICE) - Abnormal; Notable for the following components:      Result Value   Hgb urine dipstick MODERATE (*)    Protein, ur 30 (*)    Leukocytes, UA LARGE (*)    All other components within normal limits  URINE CULTURE  POCT PREGNANCY, URINE  URINE CYTOLOGY ANCILLARY ONLY    EKG  EKG Interpretation None       Radiology No results found.  Procedures Procedures (including critical care time)  Medications Ordered in UC Medications - No data to display   Initial Impression / Assessment and Plan / UC Course  I have reviewed the triage vital signs and the nursing notes.  Pertinent labs & imaging results that were available during my care of the patient were reviewed by me and considered in my medical decision making (see chart for details).     Large Leukocytes and blood to urine, urinary symptoms. Will treat with macrobid. Complete course of antibiotics.  Urine cytology and urine culture pending. Will notify of any positive findings and if any changes to  treatment are needed. Patient verbalized understanding and agreeable to plan.    Final Clinical Impressions(s) / UC Diagnoses   Final diagnoses:  Urinary tract infection without hematuria, site unspecified    ED Discharge Orders        Ordered    nitrofurantoin, macrocrystal-monohydrate, (MACROBID) 100 MG capsule  2 times daily     03/07/17 1715       Controlled Substance Prescriptions [employer] Controlled Substance Registry consulted? Not  Applicable   Zigmund Gottron, NP 03/07/17 1718    Zigmund Gottron, NP 03/07/17 1727

## 2017-03-07 NOTE — Discharge Instructions (Signed)
Drink plenty of water to empty bladder regularly. Avoid caffeine and alcohol as this can irritate the bladder.  Please complete course of antibiotics. Please continue to follow with your primary care provider if symptoms persist or do not improve. We will call you with any positive findings for yeast or bacteria.

## 2017-03-07 NOTE — ED Triage Notes (Signed)
PT reports she has been celibate for 14 years. PT recently remarried. PT reports dysuria, frequency, and back pain for 2 weeks. PT reports painful intercourse. PT reports irregular periods as well.

## 2017-03-08 ENCOUNTER — Ambulatory Visit: Payer: Self-pay | Admitting: Family Medicine

## 2017-03-08 LAB — URINE CYTOLOGY ANCILLARY ONLY
CHLAMYDIA, DNA PROBE: NEGATIVE
NEISSERIA GONORRHEA: NEGATIVE
Trichomonas: NEGATIVE

## 2017-03-08 MED FILL — NITROFURANTOIN MONO-MCR 100: 100 | 5 days supply | Qty: 10 | Fill #0

## 2017-03-10 LAB — URINE CULTURE: Culture: 50000 — AB

## 2017-03-12 ENCOUNTER — Ambulatory Visit: Payer: BLUE CROSS/BLUE SHIELD | Admitting: Psychology

## 2017-03-12 LAB — URINE CYTOLOGY ANCILLARY ONLY
BACTERIAL VAGINITIS: NEGATIVE
Candida vaginitis: NEGATIVE

## 2017-03-25 ENCOUNTER — Ambulatory Visit (INDEPENDENT_AMBULATORY_CARE_PROVIDER_SITE_OTHER): Payer: BLUE CROSS/BLUE SHIELD | Admitting: Psychology

## 2017-03-25 DIAGNOSIS — F4310 Post-traumatic stress disorder, unspecified: Secondary | ICD-10-CM

## 2017-04-05 ENCOUNTER — Encounter: Payer: Self-pay | Admitting: Family Medicine

## 2017-04-05 ENCOUNTER — Other Ambulatory Visit: Payer: Self-pay

## 2017-04-05 ENCOUNTER — Ambulatory Visit: Payer: BLUE CROSS/BLUE SHIELD | Admitting: Family Medicine

## 2017-04-05 VITALS — BP 104/60 | HR 85 | Temp 98.7°F | Resp 16 | Ht 60.24 in | Wt 153.8 lb

## 2017-04-05 DIAGNOSIS — G8929 Other chronic pain: Secondary | ICD-10-CM | POA: Diagnosis not present

## 2017-04-05 DIAGNOSIS — R1013 Epigastric pain: Secondary | ICD-10-CM

## 2017-04-05 DIAGNOSIS — K580 Irritable bowel syndrome with diarrhea: Secondary | ICD-10-CM

## 2017-04-05 MED ORDER — DICYCLOMINE HCL 10 MG PO CAPS: 10.0000 mg | ORAL_CAPSULE | Freq: Three times a day (TID) | ORAL | 1 refills | Status: DC

## 2017-04-05 NOTE — Patient Instructions (Signed)
     IF you received an x-ray today, you will receive an invoice from [facility]. Please contact [facility] at [phone] with questions or concerns regarding your invoice.   IF you received labwork today, you will receive an invoice from [facility]. Please contact [facility] at [phone] with questions or concerns regarding your invoice.   Our billing staff will not be able to assist you with questions regarding bills from these companies.  You will be contacted with the lab results as soon as they are available. The fastest way to get your results is to activate your My Chart account. Instructions are located on the last page of this paperwork. If you have not heard from us regarding the results in 2 weeks, please contact this office.     

## 2017-04-05 NOTE — Progress Notes (Signed)
3/22/20194:18 PM  Kaitlyn Wise [birth year], 47 y.o. female [id number]  Chief Complaint  Patient presents with  . New Patient (Initial Visit)    establish care, stomachache/ pain.  Seen in ED for this issue.  Onset: 2014 cholectomy in [place].  Pain x 5 years,  pain is a like a stabbing pain,not taking any otc meds for pain.  Pain not related to food.  Woke up at 5-5:30 am with gall bladder pain.  CT scan normal but keeps having pain.  Sharp pain for 20-30 mins today, pt on floor pain so bad.  2-3 episodes a day lasting 20-30 mins and then it goes away.    Pain level 9-10/10    HPI:   Patient is a 47 y.o. female with past medical history significant for depression who presents today for chronic abd pain.  Chronic epigastric pain, stabbing, radiates to mid abdomen. Years of pain. Thought to be GB related, did not resolve with chole in 2014. Reports pain lasts 15-20 minutes, always followed by diarrhea, resolves. Pain comes in waves, sometimes has several episodes for 3-4 days and then can be 2-3 weeks wo issues. Does not feel it is related to eating. So intense that it catches her breath. This year started to have nausea and reflux with it. Sometimes pain is preceded by constipation. Denies any blood or mucous in stool, denies any fever, chills or nightsweats. She does feel she has had a decrease appetite but weight is stable. She has never had an egd. She takes nothing for this pain.   Arabic interpreter used via video  Chart review Normal CMP and CBC 09/2016 Ct scan 03/2016 - unremarkable Pelvic US 09/2016 - 2.2 cm uterine fibroid  Depression screen [facility] 2/9 01/18/2017 11/14/2015 09/16/2015  Decreased Interest - 3 1  Down, Depressed, Hopeless 3 3 1   PHQ - 2 Score 3 6 2   Altered sleeping 3 3 1   Tired, decreased energy 3 3 3   Change in appetite - 3 1  Feeling bad or failure about yourself  - 2 1  Trouble concentrating - 2 1  Moving slowly or fidgety/restless - 3 0  Suicidal thoughts - 0 0    PHQ-9 Score 9 22 9     No Known Allergies  Prior to Admission medications   Medication Sig Start Date End Date Taking? Authorizing Provider  FLUoxetine (PROZAC) 20 MG tablet Take 1 tablet (20 mg total) by mouth daily. In 2 weeks begin taking 2 tablets daily. 01/18/17 04/18/17 Yes Gildardo Pounds, NP  levothyroxine (SYNTHROID, LEVOTHROID) 100 MCG tablet Take 1 tablet (100 mcg total) by mouth daily. 04/23/16  Yes Charlott Rakes, MD  loperamide (IMODIUM A-D) 2 MG tablet Take 2 mg by mouth 4 (four) times daily as needed for diarrhea or loose stools.    [provider]  phenazopyridine (PYRIDIUM) 97 MG tablet Take 97 mg by mouth 3 (three) times daily as needed for pain.    [provider]  topiramate (TOPAMAX) 50 MG tablet Take 1 tablet (50 mg total) by mouth 2 (two) times daily. Patient not taking: Reported on 04/14/2016 11/29/15   Charlott Rakes, MD    Past Medical History:  Diagnosis Date  . Dyslipidemia   . Uterine fibroid   . Vertigo     Past Surgical History:  Procedure Laterality Date  . CHOLECYSTECTOMY    . CHOLECYSTECTOMY      Social History   Tobacco Use  . Smoking status: Never Smoker  .  Smokeless tobacco: Never Used  Substance Use Topics  . Alcohol use: No    Family History  Problem Relation Age of Onset  . Diabetes Mother   . Hypertension Mother   . Diabetes Father   . Hypertension Father   . Heart disease Brother     Review of Systems  Constitutional: Negative for chills, fever and weight loss.  Respiratory: Negative for cough and shortness of breath.   Cardiovascular: Negative for chest pain, palpitations and leg swelling.  Gastrointestinal: Positive for abdominal pain, constipation, diarrhea, heartburn and nausea. Negative for blood in stool, melena and vomiting.  Genitourinary: Negative for dysuria and hematuria.  Musculoskeletal: Negative for falls and joint pain.  Neurological: Negative for tingling and focal weakness.   Endo/Heme/Allergies: Negative for polydipsia.  Psychiatric/Behavioral: Positive for depression.   Per hpi  OBJECTIVE:  Blood pressure 104/60, pulse 85, temperature 98.7 F (37.1 C), temperature source Oral, resp. rate 16, height 5' 0.24" (1.53 m), weight 153 lb 12.8 oz (69.8 kg), SpO2 98 %.  Wt Readings from Last 3 Encounters:  04/05/17 153 lb 12.8 oz (69.8 kg)  03/07/17 149 lb (67.6 kg)  01/18/17 149 lb 9.6 oz (67.9 kg)    Physical Exam  Constitutional: She is oriented to person, place, and time and well-developed, well-nourished, and in no distress.  HENT:  Head: Normocephalic and atraumatic.  Mouth/Throat: Oropharynx is clear and moist. No oropharyngeal exudate.  Eyes: Pupils are equal, round, and reactive to light. EOM are normal. No scleral icterus.  Neck: Neck supple.  Cardiovascular: Normal rate, regular rhythm and normal heart sounds. Exam reveals no gallop and no friction rub.  No murmur heard. Pulmonary/Chest: Effort normal and breath sounds normal. She has no wheezes. She has no rales.  Abdominal: Soft. Bowel sounds are normal. She exhibits no distension and no mass. There is no hepatosplenomegaly. There is tenderness in the right lower quadrant and epigastric area. There is no rebound and no guarding.  Musculoskeletal: She exhibits no edema.  Neurological: She is alert and oriented to person, place, and time. Gait normal.  Skin: Skin is warm and dry.  Psychiatric: Mood and affect normal.      ASSESSMENT and PLAN  1. Abdominal pain, chronic, epigastric - H. pylori breath test  2. Irritable bowel syndrome with diarrhea Discussed low fodmap diet, handouts given. Bentyl as needed during flare ups. Consider GI referral.   Other orders - dicyclomine (BENTYL) 10 MG capsule; Take 1 capsule (10 mg total) by mouth 4 (four) times daily -  before meals and at bedtime. As needed during flare up/pain  Return in about 1 month (around 05/03/2017).    Rutherford Guys,  MD [facility] [place], [place] [address] Ph.  [phone] Fax [phone]

## 2017-04-08 ENCOUNTER — Ambulatory Visit (INDEPENDENT_AMBULATORY_CARE_PROVIDER_SITE_OTHER): Payer: BLUE CROSS/BLUE SHIELD | Admitting: Psychology

## 2017-04-08 DIAGNOSIS — F4310 Post-traumatic stress disorder, unspecified: Secondary | ICD-10-CM

## 2017-04-08 MED FILL — DICYCLOMINE 10 MG CAPSULE: 10 | 15 days supply | Qty: 60 | Fill #0

## 2017-04-09 LAB — H. PYLORI BREATH TEST: H pylori Breath Test: NEGATIVE

## 2017-04-11 ENCOUNTER — Encounter (HOSPITAL_COMMUNITY): Payer: Self-pay | Admitting: Emergency Medicine

## 2017-04-11 ENCOUNTER — Other Ambulatory Visit: Payer: Self-pay

## 2017-04-11 ENCOUNTER — Emergency Department (HOSPITAL_COMMUNITY)
Admission: EM | Admit: 2017-04-11 | Discharge: 2017-04-11 | Disposition: A | Discharge status: Home / Self Care | Payer: BLUE CROSS/BLUE SHIELD | Attending: Physician Assistant | Admitting: Physician Assistant

## 2017-04-11 DIAGNOSIS — R112 Nausea with vomiting, unspecified: Secondary | ICD-10-CM | POA: Diagnosis not present

## 2017-04-11 DIAGNOSIS — Z79899 Other long term (current) drug therapy: Secondary | ICD-10-CM | POA: Diagnosis not present

## 2017-04-11 DIAGNOSIS — E039 Hypothyroidism, unspecified: Secondary | ICD-10-CM | POA: Insufficient documentation

## 2017-04-11 DIAGNOSIS — R197 Diarrhea, unspecified: Secondary | ICD-10-CM | POA: Insufficient documentation

## 2017-04-11 DIAGNOSIS — R1030 Lower abdominal pain, unspecified: Secondary | ICD-10-CM | POA: Diagnosis not present

## 2017-04-11 LAB — CBC
HCT: 37.9 % (ref 36.0–46.0)
Hemoglobin: 12.2 g/dL (ref 12.0–15.0)
MCH: 26.9 pg (ref 26.0–34.0)
MCHC: 32.2 g/dL (ref 30.0–36.0)
MCV: 83.7 fL (ref 78.0–100.0)
PLATELETS: 332 10*3/uL (ref 150–400)
RBC: 4.53 MIL/uL (ref 3.87–5.11)
RDW: 14.6 % (ref 11.5–15.5)
WBC: 10 10*3/uL (ref 4.0–10.5)

## 2017-04-11 LAB — COMPREHENSIVE METABOLIC PANEL
ALK PHOS: 91 U/L (ref 38–126)
ALT: 13 U/L — AB (ref 14–54)
AST: 21 U/L (ref 15–41)
Albumin: 4 g/dL (ref 3.5–5.0)
Anion gap: 9 (ref 5–15)
BUN: 13 mg/dL (ref 6–20)
CALCIUM: 9 mg/dL (ref 8.9–10.3)
CHLORIDE: 106 mmol/L (ref 101–111)
CO2: 21 mmol/L — AB (ref 22–32)
CREATININE: 0.73 mg/dL (ref 0.44–1.00)
GFR calc Af Amer: >60 mL/min (ref >60)
Glucose, Bld: 106 mg/dL — ABNORMAL HIGH (ref 65–99)
Potassium: 3.6 mmol/L (ref 3.5–5.1)
SODIUM: 136 mmol/L (ref 135–145)
Total Bilirubin: 0.2 mg/dL — ABNORMAL LOW (ref 0.3–1.2)
Total Protein: 7.9 g/dL (ref 6.5–8.1)

## 2017-04-11 LAB — URINALYSIS, ROUTINE W REFLEX MICROSCOPIC
Bilirubin Urine: NEGATIVE
GLUCOSE, UA: NEGATIVE mg/dL
Ketones, ur: 5 mg/dL — AB
Nitrite: NEGATIVE
PH: 6 (ref 5.0–8.0)
Protein, ur: 30 mg/dL — AB
SPECIFIC GRAVITY, URINE: 1.02 (ref 1.005–1.030)

## 2017-04-11 LAB — I-STAT BETA HCG BLOOD, ED (MC, WL, AP ONLY): I-stat hCG, quantitative: <5 m[IU]/mL (ref <5)

## 2017-04-11 LAB — LIPASE, BLOOD: LIPASE: 26 U/L (ref 11–51)

## 2017-04-11 MED ORDER — IBUPROFEN 800 MG PO TABS: 800.0000 mg | ORAL_TABLET | Freq: Three times a day (TID) | ORAL | 0 refills | Status: DC

## 2017-04-11 MED ORDER — KETOROLAC TROMETHAMINE 60 MG/2ML IM SOLN
30.0000 mg | Freq: Once | INTRAMUSCULAR | Status: AC
  Administered 2017-04-11: 30 mg via INTRAMUSCULAR
  Filled 2017-04-11: qty 2

## 2017-04-11 MED ORDER — ONDANSETRON 4 MG PO TBDP
4.0000 mg | ORAL_TABLET | Freq: Once | ORAL | Status: AC | PRN
  Administered 2017-04-11: 4 mg via ORAL
  Filled 2017-04-11: qty 1

## 2017-04-11 MED ORDER — ONDANSETRON 4 MG PO TBDP: ORAL_TABLET | ORAL | 0 refills | Status: DC

## 2017-04-11 NOTE — Discharge Instructions (Addendum)
Please follow with your primary care doctor in the next 2 days for a check-up. They must obtain records for further management.  ° °Do not hesitate to return to the Emergency Department for any new, worsening or concerning symptoms.  ° °

## 2017-04-11 NOTE — ED Notes (Signed)
Pt verbalized understanding of all d/c instructions, prescriptions, and f/u information. VSS. All belongings with patient at this time. Unable to sign, signature pad not working

## 2017-04-11 NOTE — ED Notes (Signed)
Pt primary language is arabic. [employer] interpreter at the bedside

## 2017-04-11 NOTE — ED Triage Notes (Signed)
Onset today developed nausea,emesis, diarrhea along with general abdominal pain currently 8/10 cramping. States started menstrual cycle yesterday and almost every other month she has these same symptoms.

## 2017-04-11 NOTE — ED Notes (Signed)
Pt given gingerale and water to drink

## 2017-04-11 NOTE — ED Provider Notes (Signed)
[facility]PART[facility] Provider Note   CSN: [id number] Arrival date & time: 04/11/17  1454     History   Chief Complaint Chief Complaint  Patient presents with  . Abdominal Pain  . Emesis  . Diarrhea   HPI   Blood pressure 137/67, pulse 68, temperature 98.3 F (36.8 C), temperature source Oral, resp. rate 20, weight 69.4 kg (153 lb), last menstrual period 04/10/2017, SpO2 100 %.  Kaitlyn Wise is a 47 y.o. female with no significant past medical history complaining of severe bilateral lower abdominal pain nausea vomiting diarrhea onset this morning.  She started her menstruation yesterday she states that since she was 47 year old she has similar symptoms when she is having her menses every other month.  She is never seen an OB/GYN for this she states the pain is completely typical as is the nausea vomiting and diarrhea.  She states that she is unable to keep anything down she denies any fever, chills, sick contacts, change in urination.  Past Medical History:  Diagnosis Date  . Dyslipidemia   . Uterine fibroid   . Vertigo     Patient Active Problem List   Diagnosis Date Noted  . Mild episode of recurrent major depressive disorder ([place]) 01/18/2017  . Migraine 12/05/2015  . Gastritis and gastroduodenitis 03/09/2015  . Dyslipidemia 06/16/2013  . Hypothyroidism 05/21/2013    Past Surgical History:  Procedure Laterality Date  . CHOLECYSTECTOMY    . CHOLECYSTECTOMY       OB History   None      Home Medications    Prior to Admission medications   Medication Sig Start Date End Date Taking? Authorizing Provider  dicyclomine (BENTYL) 10 MG capsule Take 1 capsule (10 mg total) by mouth 4 (four) times daily -  before meals and at bedtime. As needed during flare up/pain 04/05/17   Rutherford Guys, MD  FLUoxetine (PROZAC) 20 MG tablet Take 1 tablet (20 mg total) by mouth daily. In 2 weeks begin taking 2 tablets daily. 01/18/17 04/18/17  Gildardo Pounds, NP  ibuprofen (ADVIL,MOTRIN) 800 MG tablet Take 1 tablet (800 mg total) by mouth 3 (three) times daily. 04/11/17   Genetta Fiero, Elmyra Ricks, PA-C  levothyroxine (SYNTHROID, LEVOTHROID) 100 MCG tablet Take 1 tablet (100 mcg total) by mouth daily. 04/23/16   Charlott Rakes, MD  loperamide (IMODIUM A-D) 2 MG tablet Take 2 mg by mouth 4 (four) times daily as needed for diarrhea or loose stools.    [provider]  ondansetron (ZOFRAN ODT) 4 MG disintegrating tablet 4mg  ODT q4 hours prn nausea/vomit 04/11/17   Pia Jedlicka, Elmyra Ricks, PA-C  phenazopyridine (PYRIDIUM) 97 MG tablet Take 97 mg by mouth 3 (three) times daily as needed for pain.    [provider]  topiramate (TOPAMAX) 50 MG tablet Take 1 tablet (50 mg total) by mouth 2 (two) times daily. Patient not taking: Reported on 04/14/2016 11/29/15   Charlott Rakes, MD    Family History Family History  Problem Relation Age of Onset  . Diabetes Mother   . Hypertension Mother   . Diabetes Father   . Hypertension Father   . Heart disease Brother     Social History Social History   Tobacco Use  . Smoking status: Never Smoker  . Smokeless tobacco: Never Used  Substance Use Topics  . Alcohol use: No  . Drug use: No     Allergies   Patient has no known allergies.   Review of Systems  Review of Systems  A complete review of systems was obtained and all systems are negative except as noted in the HPI and PMH.   Physical Exam Updated Vital Signs BP 116/71 (BP Location: Right Arm)   Pulse 70   Temp 98.3 F (36.8 C) (Oral)   Resp 14   Wt 69.4 kg (153 lb)   LMP 04/10/2017   SpO2 100%   BMI 29.64 kg/m   Physical Exam  Constitutional: She is oriented to person, place, and time. She appears well-developed and well-nourished. No distress.  HENT:  Head: Normocephalic and atraumatic.  Mouth/Throat: Oropharynx is clear and moist.  Eyes: Pupils are equal, round, and reactive to light. Conjunctivae and EOM are normal.    Neck: Normal range of motion.  Cardiovascular: Normal rate, regular rhythm and intact distal pulses.  Pulmonary/Chest: Effort normal and breath sounds normal.  Abdominal: Soft. She exhibits no distension and no mass. There is tenderness. There is no rebound and no guarding. No hernia.  Normoactive bowel sounds, mild tenderness to palpation of suprapubic area and bilateral lower quadrants with no guarding or rebound.  No focal tenderness on abdominal exam  Musculoskeletal: Normal range of motion.  Neurological: She is alert and oriented to person, place, and time.  Skin: She is not diaphoretic.  Psychiatric: She has a normal mood and affect.  Nursing note and vitals reviewed.    ED Treatments / Results  Labs (all labs ordered are listed, but only abnormal results are displayed) Labs Reviewed  COMPREHENSIVE METABOLIC PANEL - Abnormal; Notable for the following components:      Result Value   CO2 21 (*)    Glucose, Bld 106 (*)    ALT 13 (*)    Total Bilirubin 0.2 (*)    All other components within normal limits  URINALYSIS, ROUTINE W REFLEX MICROSCOPIC - Abnormal; Notable for the following components:   APPearance HAZY (*)    Hgb urine dipstick LARGE (*)    Ketones, ur 5 (*)    Protein, ur 30 (*)    Leukocytes, UA SMALL (*)    Bacteria, UA RARE (*)    Squamous Epithelial / LPF 0-5 (*)    All other components within normal limits  LIPASE, BLOOD  CBC  I-STAT BETA HCG BLOOD, ED ([facility], [facility], [facility] ONLY)    EKG None  Radiology No results found.  Procedures Procedures (including critical care time)  Medications Ordered in ED Medications  ondansetron (ZOFRAN-ODT) disintegrating tablet 4 mg (4 mg Oral Given 04/11/17 1517)  ketorolac (TORADOL) injection 30 mg (30 mg Intramuscular Given 04/11/17 1805)     Initial Impression / Assessment and Plan / ED Course  I have reviewed the triage vital signs and the nursing notes.  Pertinent labs & imaging results that were available during  my care of the patient were reviewed by me and considered in my medical decision making (see chart for details).     Vitals:   04/11/17 1500 04/11/17 1830 04/11/17 1845 04/11/17 1855  BP: 137/67   116/71  Pulse: 68 65 76 70  Resp: 20   14  Temp: 98.3 F (36.8 C)     TempSrc: Oral     SpO2: 100% 100% 99% 100%  Weight: 69.4 kg (153 lb)       Medications  ondansetron (ZOFRAN-ODT) disintegrating tablet 4 mg (4 mg Oral Given 04/11/17 1517)  ketorolac (TORADOL) injection 30 mg (30 mg Intramuscular Given 04/11/17 1805)    Kaitlyn Wise is  47 y.o. female presenting with nausea vomiting diarrhea and severe lower abdominal pain.  Symptoms are all consistent with her premenstrual.  She states that this happens approximately every other month.  No focal tenderness on abdominal exam.  Vital signs reassuring.  Blood work and urinalysis reassuring.  Patient feels much better after Zofran and Toradol, tolerating p.o.'s, repeat abdominal exam remains benign.  Extensive discussion of return precautions and OB/GYN referral given.  Evaluation does not show pathology that would require ongoing emergent intervention or inpatient treatment. Pt is hemodynamically stable and mentating appropriately. Discussed findings and plan with patient/guardian, who agrees with care plan. All questions answered. Return precautions discussed and outpatient follow up given.      Final Clinical Impressions(s) / ED Diagnoses   Final diagnoses:  Lower abdominal pain  Nausea vomiting and diarrhea    ED Discharge Orders        Ordered    ondansetron (ZOFRAN ODT) 4 MG disintegrating tablet     04/11/17 1852    ibuprofen (ADVIL,MOTRIN) 800 MG tablet  3 times daily     04/11/17 1852       Lynnea Vandervoort, Charna Elizabeth 04/11/17 1859    Macarthur Critchley, MD 04/12/17 1948

## 2017-04-24 ENCOUNTER — Ambulatory Visit: Payer: Self-pay | Admitting: Family Medicine

## 2017-04-30 ENCOUNTER — Ambulatory Visit (INDEPENDENT_AMBULATORY_CARE_PROVIDER_SITE_OTHER): Payer: BLUE CROSS/BLUE SHIELD | Admitting: Psychology

## 2017-04-30 DIAGNOSIS — F4310 Post-traumatic stress disorder, unspecified: Secondary | ICD-10-CM | POA: Diagnosis not present

## 2017-05-07 ENCOUNTER — Ambulatory Visit (INDEPENDENT_AMBULATORY_CARE_PROVIDER_SITE_OTHER): Payer: BLUE CROSS/BLUE SHIELD | Admitting: Psychology

## 2017-05-07 DIAGNOSIS — F4310 Post-traumatic stress disorder, unspecified: Secondary | ICD-10-CM | POA: Diagnosis not present

## 2017-05-16 ENCOUNTER — Ambulatory Visit: Payer: Self-pay | Admitting: Psychology

## 2017-05-27 ENCOUNTER — Ambulatory Visit (INDEPENDENT_AMBULATORY_CARE_PROVIDER_SITE_OTHER): Payer: BLUE CROSS/BLUE SHIELD | Admitting: Psychology

## 2017-05-27 DIAGNOSIS — F4310 Post-traumatic stress disorder, unspecified: Secondary | ICD-10-CM

## 2017-06-11 ENCOUNTER — Ambulatory Visit (INDEPENDENT_AMBULATORY_CARE_PROVIDER_SITE_OTHER): Payer: BLUE CROSS/BLUE SHIELD | Admitting: Psychology

## 2017-06-11 DIAGNOSIS — F4310 Post-traumatic stress disorder, unspecified: Secondary | ICD-10-CM

## 2017-06-19 ENCOUNTER — Ambulatory Visit (INDEPENDENT_AMBULATORY_CARE_PROVIDER_SITE_OTHER): Payer: BLUE CROSS/BLUE SHIELD | Admitting: Psychology

## 2017-06-19 DIAGNOSIS — F4310 Post-traumatic stress disorder, unspecified: Secondary | ICD-10-CM

## 2017-06-25 ENCOUNTER — Ambulatory Visit: Payer: Self-pay | Admitting: Psychology

## 2017-07-04 ENCOUNTER — Ambulatory Visit (INDEPENDENT_AMBULATORY_CARE_PROVIDER_SITE_OTHER): Payer: BLUE CROSS/BLUE SHIELD | Admitting: Psychology

## 2017-07-04 DIAGNOSIS — F4310 Post-traumatic stress disorder, unspecified: Secondary | ICD-10-CM | POA: Diagnosis not present

## 2017-07-09 ENCOUNTER — Ambulatory Visit (INDEPENDENT_AMBULATORY_CARE_PROVIDER_SITE_OTHER): Payer: BLUE CROSS/BLUE SHIELD | Admitting: Psychology

## 2017-07-09 DIAGNOSIS — F4310 Post-traumatic stress disorder, unspecified: Secondary | ICD-10-CM | POA: Diagnosis not present

## 2017-07-22 ENCOUNTER — Ambulatory Visit: Payer: Self-pay | Admitting: Psychology

## 2017-07-31 ENCOUNTER — Ambulatory Visit: Payer: Self-pay

## 2017-07-31 NOTE — Telephone Encounter (Signed)
[facility] ID [id number] used for the triage call. Patient called in with c/o "chest pain." She says "the pain has been going on for several months, but it is getting worse. It's it my upper chest near the right collar bone to my shoulder and when the pain is severe, it causes me to have shortness of breath and the pain goes to the left collar bone. The pain is there all the time, but sometimes worse than others." I asked the severity of pain, she says "moderate to severe. I had to stop working because of the pain." I asked about other symptoms, she says "I have a numbness in my head, that is like when you sit on your leg too long and the muscle feels numb, that's how it is. I was nauseated yesterday because of the numbness. Difficulty breathing when the pain is real bad." According to protocol, see PCP within 24 hours, appointment scheduled for tomorrow at 1000 with Dr. Mitchel Honour, care advice given, patient verbalized understanding.   Reason for Disposition . [1] Chest pain lasts > 5 minutes AND [2] occurred > 3 days ago (72 hours) AND [3] NO chest pain or cardiac symptoms now  Answer Assessment - Initial Assessment Questions 1. LOCATION: "Where does it hurt?"       Upper chest near the right collar bone to the shoulder 2. RADIATION: "Does the pain go anywhere else?" (e.g., into neck, jaw, arms, back)     Right shoulder and left shoulder 3. ONSET: "When did the chest pain begin?" (Minutes, hours or days)      Several months ago, but is getting worse 4. PATTERN "Does the pain come and go, or has it been constant since it started?"  "Does it get worse with exertion?"      Comes and goes several times a day; yes worse with exertion 5. DURATION: "How long does it last" (e.g., seconds, minutes, hours)     There all the time 6. SEVERITY: "How bad is the pain?"  (e.g., Scale 1-10; mild, moderate, or severe)    - MILD (1-3): doesn't interfere with normal activities     - MODERATE (4-7): interferes  with normal activities or awakens from sleep    - SEVERE (8-10): excruciating pain, unable to do any normal activities       Moderate to severe 7. CARDIAC RISK FACTORS: "Do you have any history of heart problems or risk factors for heart disease?" (e.g., prior heart attack, angina; high blood pressure, diabetes, being overweight, high cholesterol, smoking, or strong family history of heart disease)     No heart problems; brother has heart problems; hypothyroidism 8. PULMONARY RISK FACTORS: "Do you have any history of lung disease?"  (e.g., blood clots in lung, asthma, emphysema, birth control pills)     No 9. CAUSE: "What do you think is causing the chest pain?"     I don't know 10. OTHER SYMPTOMS: "Do you have any other symptoms?" (e.g., dizziness, nausea, vomiting, sweating, fever, difficulty breathing, cough)      Nausea yesterday when felt numbness in head,  11. PREGNANCY: "Is there any chance you are pregnant?" "When was your last menstrual period?"       No  Protocols used: CHEST PAIN-A-AH

## 2017-08-01 ENCOUNTER — Encounter: Payer: Self-pay | Admitting: Emergency Medicine

## 2017-08-01 ENCOUNTER — Ambulatory Visit: Payer: BLUE CROSS/BLUE SHIELD | Admitting: Emergency Medicine

## 2017-08-01 ENCOUNTER — Ambulatory Visit (INDEPENDENT_AMBULATORY_CARE_PROVIDER_SITE_OTHER): Payer: BLUE CROSS/BLUE SHIELD

## 2017-08-01 VITALS — BP 104/62 | HR 78 | Temp 98.0°F | Resp 16 | Ht 60.0 in | Wt 160.2 lb

## 2017-08-01 DIAGNOSIS — E038 Other specified hypothyroidism: Secondary | ICD-10-CM | POA: Diagnosis not present

## 2017-08-01 DIAGNOSIS — M940 Chondrocostal junction syndrome [Tietze]: Secondary | ICD-10-CM

## 2017-08-01 DIAGNOSIS — F322 Major depressive disorder, single episode, severe without psychotic features: Secondary | ICD-10-CM | POA: Insufficient documentation

## 2017-08-01 DIAGNOSIS — R079 Chest pain, unspecified: Secondary | ICD-10-CM

## 2017-08-01 LAB — CBC WITH DIFFERENTIAL/PLATELET
Basophils Absolute: 0.1 10*3/uL (ref 0.0–0.2)
Basos: 1 %
EOS (ABSOLUTE): 0.2 10*3/uL (ref 0.0–0.4)
EOS: 3 %
HEMOGLOBIN: 11.3 g/dL (ref 11.1–15.9)
Hematocrit: 35.5 % (ref 34.0–46.6)
Immature Grans (Abs): 0 10*3/uL (ref 0.0–0.1)
Immature Granulocytes: 0 %
LYMPHS ABS: 1.5 10*3/uL (ref 0.7–3.1)
Lymphs: 28 %
MCH: 27 pg (ref 26.6–33.0)
MCHC: 31.8 g/dL (ref 31.5–35.7)
MCV: 85 fL (ref 79–97)
Monocytes Absolute: 0.3 10*3/uL (ref 0.1–0.9)
Monocytes: 6 %
Neutrophils Absolute: 3.4 10*3/uL (ref 1.4–7.0)
Neutrophils: 62 %
Platelets: 305 10*3/uL (ref 150–450)
RBC: 4.19 x10E6/uL (ref 3.77–5.28)
RDW: 15.8 % — ABNORMAL HIGH (ref 12.3–15.4)
WBC: 5.5 10*3/uL (ref 3.4–10.8)

## 2017-08-01 LAB — POCT URINE PREGNANCY: Preg Test, Ur: NEGATIVE

## 2017-08-01 LAB — COMPREHENSIVE METABOLIC PANEL
ALK PHOS: 92 IU/L (ref 39–117)
ALT: 12 IU/L (ref 0–32)
AST: 14 IU/L (ref 0–40)
Albumin/Globulin Ratio: 1.4 (ref 1.2–2.2)
Albumin: 4.2 g/dL (ref 3.5–5.5)
BUN / CREAT RATIO: 20 (ref 9–23)
BUN: 15 mg/dL (ref 6–24)
Bilirubin Total: <0.2 mg/dL (ref 0.0–1.2)
CALCIUM: 9 mg/dL (ref 8.7–10.2)
CO2: 22 mmol/L (ref 20–29)
CREATININE: 0.74 mg/dL (ref 0.57–1.00)
Chloride: 102 mmol/L (ref 96–106)
GFR calc Af Amer: 113 mL/min/{1.73_m2} (ref >59)
GFR, EST NON AFRICAN AMERICAN: 98 mL/min/{1.73_m2} (ref >59)
GLOBULIN, TOTAL: 3.1 g/dL (ref 1.5–4.5)
Glucose: 89 mg/dL (ref 65–99)
Potassium: 4.6 mmol/L (ref 3.5–5.2)
SODIUM: 139 mmol/L (ref 134–144)
Total Protein: 7.3 g/dL (ref 6.0–8.5)

## 2017-08-01 MED ORDER — LEVOTHYROXINE SODIUM 100 MCG PO TABS: 100.0000 ug | ORAL_TABLET | Freq: Every day | ORAL | 5 refills | Status: DC

## 2017-08-01 MED ORDER — DICLOFENAC SODIUM 75 MG PO TBEC: 75.0000 mg | DELAYED_RELEASE_TABLET | Freq: Two times a day (BID) | ORAL | 0 refills | Status: AC

## 2017-08-01 MED ORDER — FLUOXETINE HCL 20 MG PO TABS: 20.0000 mg | ORAL_TABLET | Freq: Every day | ORAL | 1 refills | Status: DC

## 2017-08-01 NOTE — Progress Notes (Signed)
Kaitlyn Wise 47 y.o.   Chief Complaint  Patient presents with  . Pain    in collar bone to neck x 2 mos, recently constant and getting worse  . chest pain    left side into shoulder and arm x 6 mos or more, difficulty breathing  . Head    numbness with blurred vision, couldn't talk or do anything, x 2 day ago    HISTORY OF PRESENT ILLNESS: This is a 47 y.o. female complaining of left-sided chest pain for the past 6 to 7 months.  Patient speaks Arabic.  Interpreter from [employer] used for this interview.  No significant past medical history.  Has been having steady, sharp pain to left upper chest, left upper back, left shoulder, left arm for the past 6 to 7 months.  Sometimes feels nauseous but no vomiting.  Sometimes she gets difficulty breathing.  No other significant associated symptomatology.  No CAD risk factors.  Pain is worse with movement and palpation.  Nothing makes it better.   HPI   Prior to Admission medications   Medication Sig Start Date End Date Taking? Authorizing Provider  levothyroxine (SYNTHROID, LEVOTHROID) 100 MCG tablet Take 1 tablet (100 mcg total) by mouth daily. 04/23/16  Yes Charlott Rakes, MD  dicyclomine (BENTYL) 10 MG capsule Take 1 capsule (10 mg total) by mouth 4 (four) times daily -  before meals and at bedtime. As needed during flare up/pain Patient not taking: Reported on 08/01/2017 04/05/17   Rutherford Guys, MD  FLUoxetine (PROZAC) 20 MG tablet Take 1 tablet (20 mg total) by mouth daily. In 2 weeks begin taking 2 tablets daily. 01/18/17 04/18/17  Gildardo Pounds, NP  ibuprofen (ADVIL,MOTRIN) 800 MG tablet Take 1 tablet (800 mg total) by mouth 3 (three) times daily. Patient not taking: Reported on 08/01/2017 04/11/17   Pisciotta, Elmyra Ricks, PA-C  topiramate (TOPAMAX) 50 MG tablet Take 1 tablet (50 mg total) by mouth 2 (two) times daily. Patient not taking: Reported on 04/14/2016 11/29/15   Charlott Rakes, MD    No Known Allergies  Patient Active Problem List     Diagnosis Date Noted  . Mild episode of recurrent major depressive disorder ([place]) 01/18/2017  . Migraine 12/05/2015  . Gastritis and gastroduodenitis 03/09/2015  . Dyslipidemia 06/16/2013  . Hypothyroidism 05/21/2013    Past Medical History:  Diagnosis Date  . Dyslipidemia   . Uterine fibroid   . Vertigo     Past Surgical History:  Procedure Laterality Date  . CHOLECYSTECTOMY    . CHOLECYSTECTOMY      Social History   Socioeconomic History  . Marital status: Widowed    Spouse name: Not on file  . Number of children: Not on file  . Years of education: Not on file  . Highest education level: Not on file  Occupational History  . Not on file  Social Needs  . Financial resource strain: Not on file  . Food insecurity:    Worry: Not on file    Inability: Not on file  . Transportation needs:    Medical: Not on file    Non-medical: Not on file  Tobacco Use  . Smoking status: Never Smoker  . Smokeless tobacco: Never Used  Substance and Sexual Activity  . Alcohol use: No  . Drug use: No  . Sexual activity: Not on file  Lifestyle  . Physical activity:    Days per week: Not on file    Minutes per session: Not on  file  . Stress: Not on file  Relationships  . Social connections:    Talks on phone: Not on file    Gets together: Not on file    Attends religious service: Not on file    Active member of club or organization: Not on file    Attends meetings of clubs or organizations: Not on file    Relationship status: Not on file  . Intimate partner violence:    Fear of current or ex partner: Not on file    Emotionally abused: Not on file    Physically abused: Not on file    Forced sexual activity: Not on file  Other Topics Concern  . Not on file  Social History Narrative  . Not on file    Family History  Problem Relation Age of Onset  . Diabetes Mother   . Hypertension Mother   . Diabetes Father   . Hypertension Father   . Heart disease Brother       Review of Systems  Constitutional: Negative.  Negative for chills, fever and weight loss.  HENT: Negative.  Negative for congestion, hearing loss, nosebleeds and sore throat.   Eyes: Negative.  Negative for blurred vision, double vision, discharge and redness.  Respiratory: Negative.  Negative for cough, hemoptysis, shortness of breath and wheezing.   Cardiovascular: Positive for chest pain. Negative for palpitations, claudication, leg swelling and PND.  Gastrointestinal: Negative.  Negative for abdominal pain, blood in stool, constipation, diarrhea, nausea and vomiting.  Genitourinary: Negative.  Negative for dysuria and hematuria.  Musculoskeletal: Positive for back pain and myalgias. Negative for joint pain and neck pain.  Skin: Negative.  Negative for rash.  Neurological: Negative.  Negative for dizziness, loss of consciousness and headaches.  Endo/Heme/Allergies: Negative.   All other systems reviewed and are negative.  Vitals:   08/01/17 1029  BP: 104/62  Pulse: 78  Resp: 16  Temp: 98 F (36.7 C)  SpO2: 99%     Physical Exam  Constitutional: She is oriented to person, place, and time. She appears well-developed and well-nourished.  HENT:  Head: Normocephalic and atraumatic.  Right Ear: External ear normal.  Left Ear: External ear normal.  Nose: Nose normal.  Mouth/Throat: Oropharynx is clear and moist.  Eyes: Pupils are equal, round, and reactive to light. Conjunctivae and EOM are normal.  Neck: Normal range of motion. Neck supple. No JVD present. No thyromegaly present.  Cardiovascular: Normal rate, regular rhythm and normal heart sounds.  Pulmonary/Chest: Effort normal and breath sounds normal. She exhibits tenderness (Pain reproduced with palpation of left upper chest).  Abdominal: Soft. Bowel sounds are normal. She exhibits no distension. There is no tenderness.  Musculoskeletal: Normal range of motion. She exhibits no edema.  Left upper extremity: Full range  of motion.  Nontender.  No swelling.  NVI  Lymphadenopathy:    She has no cervical adenopathy.  Neurological: She is alert and oriented to person, place, and time. No sensory deficit. She exhibits normal muscle tone.  Skin: Skin is warm and dry. Capillary refill takes less than 2 seconds.  Psychiatric: She has a normal mood and affect. Her behavior is normal.  Vitals reviewed.  EKG: Normal sinus rhythm with ventricular rate of 68/min.  No acute ischemic changes.  Normal intervals.  No axis deviation.  No old EKG to compare with.  EKG within normal limits.   Dg Chest 2 View  Result Date: 08/01/2017 CLINICAL DATA:  Chest pain. EXAM: CHEST - 2  VIEW COMPARISON:  11/09/2015. FINDINGS: Mediastinum and hilar structures are normal. Heart size normal. No focal infiltrate. No pleural effusion or pneumothorax. No acute bony abnormality. Surgical clips right upper quadrant. IMPRESSION: No acute cardiopulmonary disease. Electronically Signed   By: Marcello Moores  Register   On: 08/01/2017 11:46   A total of 40 minutes was spent in the room with the patient, greater than 50% of which was in counseling/coordination of care regarding differential diagnosis, treatment, medications, need for follow-up if no better or worse.  ASSESSMENT & PLAN: Sumayya was seen today for pain, chest pain and head.  Diagnoses and all orders for this visit:  Chest pain in adult -     EKG 12-Lead -     CBC with Differential/Platelet -     Comprehensive metabolic panel -     DG Chest 2 View; Future -     POCT urine pregnancy  Costochondritis -     diclofenac (VOLTAREN) 75 MG EC tablet; Take 1 tablet (75 mg total) by mouth 2 (two) times daily for 5 days.  Current severe episode of major depressive disorder without psychotic features without prior episode (HCC) -     FLUoxetine (PROZAC) 20 MG tablet; Take 1 tablet (20 mg total) by mouth daily. In 2 weeks begin taking 2 tablets daily.  Other specified hypothyroidism -      levothyroxine (SYNTHROID, LEVOTHROID) 100 MCG tablet; Take 1 tablet (100 mcg total) by mouth daily.    Patient Instructions       IF you received an x-ray today, you will receive an invoice from [facility]. Please contact [facility] at [phone] with questions or concerns regarding your invoice.   IF you received labwork today, you will receive an invoice from [facility]. Please contact [facility] at [phone] with questions or concerns regarding your invoice.   Our billing staff will not be able to assist you with questions regarding bills from these companies.  You will be contacted with the lab results as soon as they are available. The fastest way to get your results is to activate your My Chart account. Instructions are located on the last page of this paperwork. If you have not heard from [place] regarding the results in 2 weeks, please contact this office.     Costochondritis Costochondritis is swelling and irritation (inflammation) of the tissue (cartilage) that connects your ribs to your breastbone (sternum). This causes pain in the front of your chest. Usually, the pain:  Starts gradually.  Is in more than one rib.  This condition usually goes away on its own over time. Follow these instructions at home:  Do not do anything that makes your pain worse.  If directed, put ice on the painful area: ? Put ice in a plastic bag. ? Place a towel between your skin and the bag. ? Leave the ice on for 20 minutes, 2-3 times a day.  If directed, put heat on the affected area as often as told by your doctor. Use the heat source that your doctor tells you to use, such as a moist heat pack or a heating pad. ? Place a towel between your skin and the heat source. ? Leave the heat on for 20-30 minutes. ? Take off the heat if your skin turns bright red. This is very important if you cannot feel pain, heat, or cold. You may have a greater risk of getting burned.  Take  over-the-counter and prescription medicines only as told by your doctor.  Return to your normal activities as told by your doctor. Ask your doctor what activities are safe for you.  Keep all follow-up visits as told by your doctor. This is important. Contact a doctor if:  You have chills or a fever.  Your pain does not go away or it gets worse.  You have a cough that does not go away. Get help right away if:  You are short of breath. This information is not intended to replace advice given to you by your health care provider. Make sure you discuss any questions you have with your health care provider. Document Released: 06/20/2007 Document Revised: 07/22/2015 Document Reviewed: 04/27/2015 Elsevier Interactive Patient Education  2018 Elsevier Inc.      Agustina Caroli, MD [facility]

## 2017-08-01 NOTE — Patient Instructions (Addendum)
     IF you received an x-ray today, you will receive an invoice from [facility]. Please contact [facility] at [phone] with questions or concerns regarding your invoice.   IF you received labwork today, you will receive an invoice from [facility]. Please contact [facility] at [phone] with questions or concerns regarding your invoice.   Our billing staff will not be able to assist you with questions regarding bills from these companies.  You will be contacted with the lab results as soon as they are available. The fastest way to get your results is to activate your My Chart account. Instructions are located on the last page of this paperwork. If you have not heard from [place] regarding the results in 2 weeks, please contact this office.     Costochondritis Costochondritis is swelling and irritation (inflammation) of the tissue (cartilage) that connects your ribs to your breastbone (sternum). This causes pain in the front of your chest. Usually, the pain:  Starts gradually.  Is in more than one rib.  This condition usually goes away on its own over time. Follow these instructions at home:  Do not do anything that makes your pain worse.  If directed, put ice on the painful area: ? Put ice in a plastic bag. ? Place a towel between your skin and the bag. ? Leave the ice on for 20 minutes, 2-3 times a day.  If directed, put heat on the affected area as often as told by your doctor. Use the heat source that your doctor tells you to use, such as a moist heat pack or a heating pad. ? Place a towel between your skin and the heat source. ? Leave the heat on for 20-30 minutes. ? Take off the heat if your skin turns bright red. This is very important if you cannot feel pain, heat, or cold. You may have a greater risk of getting burned.  Take over-the-counter and prescription medicines only as told by your doctor.  Return to your normal activities as told by your doctor.  Ask your doctor what activities are safe for you.  Keep all follow-up visits as told by your doctor. This is important. Contact a doctor if:  You have chills or a fever.  Your pain does not go away or it gets worse.  You have a cough that does not go away. Get help right away if:  You are short of breath. This information is not intended to replace advice given to you by your health care provider. Make sure you discuss any questions you have with your health care provider. Document Released: 06/20/2007 Document Revised: 07/22/2015 Document Reviewed: 04/27/2015 Elsevier Interactive Patient Education  [employer].

## 2017-08-02 ENCOUNTER — Encounter: Payer: Self-pay | Admitting: Emergency Medicine

## 2017-08-02 ENCOUNTER — Ambulatory Visit (INDEPENDENT_AMBULATORY_CARE_PROVIDER_SITE_OTHER): Payer: BLUE CROSS/BLUE SHIELD | Admitting: Psychology

## 2017-08-02 DIAGNOSIS — F4310 Post-traumatic stress disorder, unspecified: Secondary | ICD-10-CM | POA: Diagnosis not present

## 2017-08-05 ENCOUNTER — Ambulatory Visit (INDEPENDENT_AMBULATORY_CARE_PROVIDER_SITE_OTHER): Payer: BLUE CROSS/BLUE SHIELD | Admitting: Psychology

## 2017-08-05 DIAGNOSIS — F4310 Post-traumatic stress disorder, unspecified: Secondary | ICD-10-CM | POA: Diagnosis not present

## 2017-08-06 ENCOUNTER — Encounter: Payer: Self-pay | Admitting: Family Medicine

## 2017-08-09 ENCOUNTER — Ambulatory Visit: Payer: BLUE CROSS/BLUE SHIELD | Admitting: Psychology

## 2017-08-14 ENCOUNTER — Ambulatory Visit: Payer: Self-pay | Admitting: Psychology

## 2017-08-15 ENCOUNTER — Encounter: Payer: Self-pay | Admitting: Family Medicine

## 2017-08-19 ENCOUNTER — Ambulatory Visit: Payer: Self-pay | Admitting: Psychology

## 2017-08-27 ENCOUNTER — Ambulatory Visit (INDEPENDENT_AMBULATORY_CARE_PROVIDER_SITE_OTHER): Payer: BLUE CROSS/BLUE SHIELD | Admitting: Psychology

## 2017-08-27 DIAGNOSIS — F4310 Post-traumatic stress disorder, unspecified: Secondary | ICD-10-CM

## 2017-08-28 DIAGNOSIS — E038 Other specified hypothyroidism: Secondary | ICD-10-CM

## 2017-09-02 ENCOUNTER — Ambulatory Visit: Payer: Self-pay | Admitting: Psychology

## 2017-09-05 ENCOUNTER — Other Ambulatory Visit: Payer: Self-pay

## 2017-09-05 ENCOUNTER — Emergency Department (HOSPITAL_COMMUNITY)
Admission: EM | Admit: 2017-09-05 | Discharge: 2017-09-06 | Disposition: A | Discharge status: Home / Self Care | Payer: BLUE CROSS/BLUE SHIELD | Attending: Emergency Medicine | Admitting: Emergency Medicine

## 2017-09-05 ENCOUNTER — Encounter (HOSPITAL_COMMUNITY): Payer: Self-pay | Admitting: Emergency Medicine

## 2017-09-05 DIAGNOSIS — Z79899 Other long term (current) drug therapy: Secondary | ICD-10-CM | POA: Insufficient documentation

## 2017-09-05 DIAGNOSIS — R51 Headache: Secondary | ICD-10-CM | POA: Insufficient documentation

## 2017-09-05 DIAGNOSIS — N938 Other specified abnormal uterine and vaginal bleeding: Secondary | ICD-10-CM | POA: Insufficient documentation

## 2017-09-05 DIAGNOSIS — R519 Headache, unspecified: Secondary | ICD-10-CM

## 2017-09-05 DIAGNOSIS — R0981 Nasal congestion: Secondary | ICD-10-CM

## 2017-09-05 DIAGNOSIS — E039 Hypothyroidism, unspecified: Secondary | ICD-10-CM | POA: Insufficient documentation

## 2017-09-05 LAB — COMPREHENSIVE METABOLIC PANEL
ALK PHOS: 106 U/L (ref 38–126)
ALT: 16 U/L (ref 0–44)
AST: 23 U/L (ref 15–41)
Albumin: 4 g/dL (ref 3.5–5.0)
Anion gap: 7 (ref 5–15)
BUN: 9 mg/dL (ref 6–20)
CALCIUM: 9 mg/dL (ref 8.9–10.3)
CO2: 25 mmol/L (ref 22–32)
Chloride: 105 mmol/L (ref 98–111)
Creatinine, Ser: 0.87 mg/dL (ref 0.44–1.00)
GFR calc non Af Amer: >60 mL/min (ref >60)
Glucose, Bld: 101 mg/dL — ABNORMAL HIGH (ref 70–99)
Potassium: 4.4 mmol/L (ref 3.5–5.1)
SODIUM: 137 mmol/L (ref 135–145)
Total Bilirubin: 0.5 mg/dL (ref 0.3–1.2)
Total Protein: 7.7 g/dL (ref 6.5–8.1)

## 2017-09-05 LAB — CBC WITH DIFFERENTIAL/PLATELET
Abs Immature Granulocytes: 0 10*3/uL (ref 0.0–0.1)
BASOS PCT: 1 %
Basophils Absolute: 0.1 10*3/uL (ref 0.0–0.1)
Eosinophils Absolute: 0.1 10*3/uL (ref 0.0–0.7)
Eosinophils Relative: 1 %
HCT: 37.1 % (ref 36.0–46.0)
Hemoglobin: 11.4 g/dL — ABNORMAL LOW (ref 12.0–15.0)
IMMATURE GRANULOCYTES: 0 %
Lymphocytes Relative: 26 %
Lymphs Abs: 1.6 10*3/uL (ref 0.7–4.0)
MCH: 26.8 pg (ref 26.0–34.0)
MCHC: 30.7 g/dL (ref 30.0–36.0)
MCV: 87.3 fL (ref 78.0–100.0)
MONOS PCT: 5 %
Monocytes Absolute: 0.3 10*3/uL (ref 0.1–1.0)
NEUTROS ABS: 4 10*3/uL (ref 1.7–7.7)
NEUTROS PCT: 67 %
PLATELETS: 272 10*3/uL (ref 150–400)
RBC: 4.25 MIL/uL (ref 3.87–5.11)
RDW: 15.6 % — ABNORMAL HIGH (ref 11.5–15.5)
WBC: 6 10*3/uL (ref 4.0–10.5)

## 2017-09-05 LAB — I-STAT BETA HCG BLOOD, ED (MC, WL, AP ONLY): I-stat hCG, quantitative: <5 m[IU]/mL (ref <5)

## 2017-09-05 MED ORDER — DIPHENHYDRAMINE HCL 50 MG/ML IJ SOLN
25.0000 mg | Freq: Once | INTRAMUSCULAR | Status: AC
  Administered 2017-09-05: 25 mg via INTRAVENOUS
  Filled 2017-09-05: qty 1

## 2017-09-05 MED ORDER — KETOROLAC TROMETHAMINE 30 MG/ML IJ SOLN
30.0000 mg | Freq: Once | INTRAMUSCULAR | Status: AC
Start: 2017-09-05 — End: 2017-09-05
  Administered 2017-09-05: 30 mg via INTRAVENOUS
  Filled 2017-09-05: qty 1

## 2017-09-05 MED ORDER — DEXAMETHASONE SODIUM PHOSPHATE 10 MG/ML IJ SOLN
10.0000 mg | Freq: Once | INTRAMUSCULAR | Status: AC
  Administered 2017-09-05: 10 mg via INTRAVENOUS
  Filled 2017-09-05: qty 1

## 2017-09-05 MED ORDER — METOCLOPRAMIDE HCL 5 MG/ML IJ SOLN
10.0000 mg | Freq: Once | INTRAMUSCULAR | Status: AC
Start: 2017-09-05 — End: 2017-09-05
  Administered 2017-09-05: 10 mg via INTRAVENOUS
  Filled 2017-09-05: qty 2

## 2017-09-05 MED ORDER — METOCLOPRAMIDE HCL 10 MG PO TABS: 10.0000 mg | ORAL_TABLET | Freq: Four times a day (QID) | ORAL | 0 refills | Status: DC | PRN

## 2017-09-05 MED ORDER — NAPROXEN 500 MG PO TABS: 500.0000 mg | ORAL_TABLET | Freq: Two times a day (BID) | ORAL | 0 refills | Status: DC | PRN

## 2017-09-05 MED ORDER — SODIUM CHLORIDE 0.9 % IV BOLUS (SEPSIS)
1000.0000 mL | Freq: Once | INTRAVENOUS | Status: AC
  Administered 2017-09-05: 1000 mL via INTRAVENOUS

## 2017-09-05 MED ORDER — FLUTICASONE PROPIONATE 50 MCG/ACT NA SUSP: 2.0000 | Freq: Every day | NASAL | 0 refills | Status: DC

## 2017-09-05 NOTE — ED Provider Notes (Signed)
[facility] EMERGENCY DEPARTMENT Provider Note   CSN: [id number] Arrival date & time: 09/05/17  1544     History   Chief Complaint Chief Complaint  Patient presents with  . Headache  . Vaginal Bleeding    HPI Kaitlyn Wise is a 47 y.o. female with a PMHx of uterine fibroid, vertigo, HLD, migraines, and hypothyroidism, who presents to the ED with complaints of headache for the last 3 days.  Patient history is somewhat limited secondary to the patient being a difficult/poor historian, video interpreter was used however the patient essentially only spoke to her husband who then spoke to the interpreter.  She states that she has been on her menstrual cycle for the last 20 days, somewhat heavier over the last 10 days, but denies any passage of clots.  She reports that when she has had her menstrual cycles before she sometimes gets headaches, she also gets headaches when she does not eat.  For the last 3 days she has had an intermittent headache which was worse this morning but she states that she is been crying a lot lately.  She also mentions that she has not eaten anything today.  She states that she is mainly here because of her headache.  She describes this headache as 7/10 intermittent pressure in the front of her head and face across her sinuses, nonradiating, worse with crying, and improved with sleeping.  She states that she has had some sinus congestion because she is been crying.  She also reports feeling "numb all over, when asked what that means she states that she feels cold but still has full sensation.  It is unclear whether she has any lightheadedness, she does not directly answer this question, and language barrier as well as difficult historian status makes it very difficult to figure out whether she has any dizziness/lightheadedness.  She denies any fevers, chills, vision changes, ear pain or drainage, CP, SOB, abdominal pain, N/V/D/C, hematuria, dysuria, myalgias,  arthralgias, numbness, tingling, focal weakness, or any other complaints at this time.   The history is provided by the patient and medical records. The history is limited by a language barrier. A language interpreter was used ([employer] video interpreter).    Past Medical History:  Diagnosis Date  . Dyslipidemia   . Thyroid disease   . Uterine fibroid   . Vertigo     Patient Active Problem List   Diagnosis Date Noted  . Chest pain in adult 08/01/2017  . Costochondritis 08/01/2017  . Current severe episode of major depressive disorder without psychotic features without prior episode ([place]) 08/01/2017  . Mild episode of recurrent major depressive disorder ([place]) 01/18/2017  . Migraine 12/05/2015  . Gastritis and gastroduodenitis 03/09/2015  . Dyslipidemia 06/16/2013  . Hypothyroidism 05/21/2013    Past Surgical History:  Procedure Laterality Date  . CHOLECYSTECTOMY    . CHOLECYSTECTOMY       OB History   None      Home Medications    Prior to Admission medications   Medication Sig Start Date End Date Taking? Authorizing Provider  dicyclomine (BENTYL) 10 MG capsule Take 1 capsule (10 mg total) by mouth 4 (four) times daily -  before meals and at bedtime. As needed during flare up/pain Patient not taking: Reported on 08/01/2017 04/05/17   Rutherford Guys, MD  FLUoxetine (PROZAC) 20 MG tablet Take 1 tablet (20 mg total) by mouth daily. In 2 weeks begin taking 2 tablets daily. 08/01/17 10/30/17  Sagardia,  Ines Bloomer, MD  ibuprofen (ADVIL,MOTRIN) 800 MG tablet Take 1 tablet (800 mg total) by mouth 3 (three) times daily. Patient not taking: Reported on 08/01/2017 04/11/17   Pisciotta, Elmyra Ricks, PA-C  levothyroxine (SYNTHROID, LEVOTHROID) 100 MCG tablet Take 1 tablet (100 mcg total) by mouth daily. 08/01/17   Horald Pollen, MD  topiramate (TOPAMAX) 50 MG tablet Take 1 tablet (50 mg total) by mouth 2 (two) times daily. Patient not taking: Reported on 04/14/2016 11/29/15   Charlott Rakes, MD    Family History Family History  Problem Relation Age of Onset  . Diabetes Mother   . Hypertension Mother   . Diabetes Father   . Hypertension Father   . Heart disease Brother     Social History Social History   Tobacco Use  . Smoking status: Never Smoker  . Smokeless tobacco: Never Used  Substance Use Topics  . Alcohol use: No  . Drug use: No     Allergies   Patient has no known allergies.   Review of Systems Review of Systems  Constitutional: Negative for chills and fever.  HENT: Positive for congestion (from crying). Negative for ear discharge and ear pain.   Eyes: Negative for visual disturbance.  Respiratory: Negative for shortness of breath.   Cardiovascular: Negative for chest pain.  Gastrointestinal: Negative for abdominal pain, constipation, diarrhea, nausea and vomiting.  Genitourinary: Positive for vaginal bleeding (menstrual cycle x20 days). Negative for dysuria and hematuria.  Musculoskeletal: Negative for arthralgias and myalgias.  Skin: Negative for color change.  Allergic/Immunologic: Negative for immunocompromised state.  Neurological: Positive for headaches. Negative for weakness and numbness. Light-headedness: unclear.  Psychiatric/Behavioral: Negative for confusion.   All other systems reviewed and are negative for acute change except as noted in the HPI.    Physical Exam Updated Vital Signs Temp (!) 97.1 F (36.2 C) (Oral)    BP 105/74   Pulse 62   Resp 16   Wt 72.6 kg   LMP 08/15/2017   SpO2 100%   BMI 31.25 kg/m   Physical Exam  Constitutional: She is oriented to person, place, and time. Vital signs are normal. She appears well-developed and well-nourished.  Non-toxic appearance. No distress.  Afebrile, nontoxic, NAD, crying intermittently throughout exam  HENT:  Head: Normocephalic and atraumatic.  Right Ear: Hearing, tympanic membrane, external ear and ear canal normal.  Left Ear: Hearing, tympanic membrane,  external ear and ear canal normal.  Nose: Mucosal edema present. Right sinus exhibits no maxillary sinus tenderness and no frontal sinus tenderness. Left sinus exhibits no maxillary sinus tenderness and no frontal sinus tenderness.  Mouth/Throat: Uvula is midline, oropharynx is clear and moist and mucous membranes are normal. No trismus in the jaw. No uvula swelling.  Ears are clear bilaterally. Nose moderately congested but no rhinorrhea, and no sinus TTP. Oropharynx clear and moist, without uvular swelling or deviation, no trismus or drooling, no tonsillar swelling or erythema  Eyes: Pupils are equal, round, and reactive to light. Conjunctivae and EOM are normal. Right eye exhibits no discharge. Left eye exhibits no discharge.  PERRL, EOMI, no nystagmus  Neck: Normal range of motion. Neck supple. Muscular tenderness present. No spinous process tenderness present. No neck rigidity. Normal range of motion present.  FROM intact without spinous process TTP, no bony stepoffs or deformities, with mild b/l paraspinous muscle TTP without muscle spasms. No rigidity or meningeal signs. No bruising or swelling.   Cardiovascular: Normal rate, regular rhythm, normal heart sounds and intact  distal pulses. Exam reveals no gallop and no friction rub.  No murmur heard. Pulmonary/Chest: Effort normal and breath sounds normal. No respiratory distress. She has no decreased breath sounds. She has no wheezes. She has no rhonchi. She has no rales.  Abdominal: Soft. Normal appearance and bowel sounds are normal. She exhibits no distension. There is no tenderness. There is no rigidity, no rebound, no guarding, no CVA tenderness, no tenderness at McBurney's point and negative Murphy's sign.  Soft, NTND, +BS throughout, no r/g/r, neg murphy's, neg mcburney's, no CVA TTP   Musculoskeletal: Normal range of motion.  MAE x4 Strength and sensation grossly intact in all extremities Distal pulses intact Gait steady  Neurological:  She is alert and oriented to person, place, and time. She has normal strength. No cranial nerve deficit or sensory deficit. Coordination and gait normal. GCS eye subscore is 4. GCS verbal subscore is 5. GCS motor subscore is 6.  CN 2-12 grossly intact A&O x4 GCS 15 Sensation and strength intact Gait nonataxic including with tandem walking Coordination with finger-to-nose WNL Neg pronator drift   Skin: Skin is warm, dry and intact. No rash noted.  Psychiatric: She has a normal mood and affect.  Nursing note and vitals reviewed.    ED Treatments / Results  Labs (all labs ordered are listed, but only abnormal results are displayed) Labs Reviewed  COMPREHENSIVE METABOLIC PANEL - Abnormal; Notable for the following components:      Result Value   Glucose, Bld 101 (*)    All other components within normal limits  CBC WITH DIFFERENTIAL/PLATELET - Abnormal; Notable for the following components:   Hemoglobin 11.4 (*)    RDW 15.6 (*)    All other components within normal limits  I-STAT BETA HCG BLOOD, ED ([facility], [facility], [facility] ONLY)    EKG None  Radiology No results found.  Procedures Procedures (including critical care time)  Medications Ordered in ED Medications  metoCLOPramide (REGLAN) injection 10 mg (10 mg Intravenous Given 09/05/17 2150)    And  diphenhydrAMINE (BENADRYL) injection 25 mg (25 mg Intravenous Given 09/05/17 2152)    And  sodium chloride 0.9 % bolus 1,000 mL (1,000 mLs Intravenous New Bag/Given 09/05/17 2149)    And  ketorolac (TORADOL) 30 MG/ML injection 30 mg (30 mg Intravenous Given 09/05/17 2153)  dexamethasone (DECADRON) injection 10 mg (10 mg Intravenous Given 09/05/17 2149)     Initial Impression / Assessment and Plan / ED Course  I have reviewed the triage vital signs and the nursing notes.  Pertinent labs & imaging results that were available during my care of the patient were reviewed by me and considered in my medical decision making (see chart for  details).     47 y.o. female here with headache x3 days, has been crying a lot, states she has headaches when she skips meals and today she hasn't eaten and feels like her headache is worse. Describes a frontal/sinus headache, reports congestion from crying. Has had her menstrual cycle for 20 days, and reports hx of headaches with menses. On exam, no focal neuro deficits, pt very difficult historian and basically only speaks to her husband who then speaks to the interpreter but she's able to perform all tasks without difficulty; has been crying; moderate sinus congestion but no sinus TTP; ears clear; no meningismus, VSS; no abdominal tenderness; gait steady and nonataxic, mild paracervical muscle TTP but without significant spasm. Work up thus far shows: CBC w/diff with stable mild anemia (Hgb 11.4)  and otherwise unremarkable; CMP WNL; betaHCG neg. I suspect her vaginal bleeding is from her uterine fibroids which she has a hx of, this is not her primary complaint and she is stable with regards to this, doubt need for further emergent work up at this time, advised f/up with OBGYN. I suspect the headache is related to being on menses, crying causing congestion, not eating, and mild tension related headache as well. Doubt need for head imaging, doubt other emergent pathology is at play right now. Will give migraine cocktail plus decadron and feed pt, then reassess shortly.   10:26 PM Pt just received meds, states she still has headache but wants to give meds more time to work; declines wanting anything else right now, wants to rest and see if that will help. Patient care to be resumed by Melina Schools, PA-C at shift change sign-out. Patient history has been discussed with midlevel resuming care. Plan is to reassess after a short while, and if improved then likely d/c home with flonase, reglan, and naprosyn, advised use of other OTC remedies for symptom control. Discussed f/up with PCP in 1wk for recheck of  symptoms and ongoing management of her headaches; also advised f/up with her OBGYN for ongoing management of her dysfunctional uterine bleeding/uterine fibroids. I anticipate she'll be improved once the meds take effect, since I suspect this is a headache from crying/congestion/menses/tension. Please see Verita Schneiders notes for further documentation of pending results and dispo/care. Pt stable at sign-out and updated on transfer of care.     Final Clinical Impressions(s) / ED Diagnoses   Final diagnoses:  Nonintractable episodic headache, unspecified headache type  Sinus congestion  DUB (dysfunctional uterine bleeding)    ED Discharge Orders         Ordered    metoCLOPramide (REGLAN) 10 MG tablet  Every 6 hours PRN     09/05/17 2225    fluticasone (FLONASE) 50 MCG/ACT nasal spray  Daily     09/05/17 2225    naproxen (NAPROSYN) 500 MG tablet  2 times daily PRN     09/05/17 [address] [place], Hershal Coria 09/05/17 2227    Carmin Muskrat, MD 09/05/17 2228

## 2017-09-05 NOTE — ED Provider Notes (Signed)
Care assumed from previous provider Experiment. Please see their note for further details to include full history and physical. To summarize in short pt is a 47 year old female presents to the ED for evaluation of headache and vaginal bleeding.. Case discussed, plan agreed upon.   Time of care handoff was awaiting for migraine cocktail to see if headache improved.  Repeat assessment patient states her headache has improved and she is ready for discharge.  Please see prior providers note for discharge instructions.  Patient discharged in satisfactory condition and is hemodynamic stable.       Doristine Devoid, PA-C 09/05/17 2335    Carmin Muskrat, MD 09/07/17 [phone]

## 2017-09-05 NOTE — ED Triage Notes (Signed)
Pt c/o headache for 3 days -- has been seen for headaches in past-  Pt is crying during assessment, states having vaginal bleeding for 20 days. Denies cramping, Last normal period - last month, not sure of exact day-sometimes gets headaches with periods.  Using interpretor machine per request of husband.

## 2017-09-05 NOTE — Discharge Instructions (Signed)
Alternate between tylenol and naprosyn as needed for pain/headaches. Use reglan as needed for headaches or nausea. Stay well hydrated. Use flonase and over the counter netipot as directed to help with sinus congestion. You may also use over the counter antihistamines such as benadryl/zyrtec/claritin/etc to help with symptoms. Get plenty of rest. Follow up with your regular doctor in 5-7 days for recheck of symptoms and ongoing management of your headaches, and with your OBGYN in 1 week for ongoing management of your vaginal bleeding. Return to the ER for changes or worsening symptoms.

## 2017-09-06 MED FILL — FLUTICASONE PROP 50 MCG SPR: 50 | 30 days supply | Qty: 16 | Fill #0

## 2017-09-06 MED FILL — METOCLOPRAMIDE 10 MG TABLET: 10 | 3 days supply | Qty: 12 | Fill #0

## 2017-09-06 MED FILL — NAPROXEN 500 MG TABLET: 500 | 10 days supply | Qty: 20 | Fill #0

## 2017-09-09 ENCOUNTER — Ambulatory Visit: Payer: Self-pay | Admitting: Psychology

## 2017-09-13 ENCOUNTER — Ambulatory Visit: Payer: BLUE CROSS/BLUE SHIELD | Admitting: Psychology

## 2017-09-17 ENCOUNTER — Ambulatory Visit: Payer: Self-pay | Admitting: Psychology

## 2017-09-23 ENCOUNTER — Ambulatory Visit: Payer: BLUE CROSS/BLUE SHIELD | Admitting: Psychology

## 2017-09-23 ENCOUNTER — Ambulatory Visit (INDEPENDENT_AMBULATORY_CARE_PROVIDER_SITE_OTHER): Payer: BLUE CROSS/BLUE SHIELD | Admitting: Psychology

## 2017-09-23 DIAGNOSIS — F4310 Post-traumatic stress disorder, unspecified: Secondary | ICD-10-CM | POA: Diagnosis not present

## 2017-10-07 ENCOUNTER — Ambulatory Visit (INDEPENDENT_AMBULATORY_CARE_PROVIDER_SITE_OTHER): Payer: BLUE CROSS/BLUE SHIELD | Admitting: Psychology

## 2017-10-07 DIAGNOSIS — F4310 Post-traumatic stress disorder, unspecified: Secondary | ICD-10-CM | POA: Diagnosis not present

## 2017-10-21 ENCOUNTER — Ambulatory Visit: Payer: Self-pay | Admitting: Psychology

## 2017-10-22 ENCOUNTER — Encounter: Payer: Self-pay | Admitting: Licensed Clinical Social Worker

## 2017-10-22 ENCOUNTER — Ambulatory Visit: Payer: BLUE CROSS/BLUE SHIELD | Attending: Family Medicine | Admitting: Family Medicine

## 2017-10-22 ENCOUNTER — Ambulatory Visit (HOSPITAL_BASED_OUTPATIENT_CLINIC_OR_DEPARTMENT_OTHER): Payer: BLUE CROSS/BLUE SHIELD | Admitting: Licensed Clinical Social Worker

## 2017-10-22 ENCOUNTER — Encounter: Payer: Self-pay | Admitting: Family Medicine

## 2017-10-22 VITALS — BP 96/64 | HR 57 | Temp 97.5°F | Ht 60.0 in | Wt 154.2 lb

## 2017-10-22 DIAGNOSIS — Z9049 Acquired absence of other specified parts of digestive tract: Secondary | ICD-10-CM | POA: Insufficient documentation

## 2017-10-22 DIAGNOSIS — Z79899 Other long term (current) drug therapy: Secondary | ICD-10-CM | POA: Insufficient documentation

## 2017-10-22 DIAGNOSIS — E038 Other specified hypothyroidism: Secondary | ICD-10-CM | POA: Diagnosis not present

## 2017-10-22 DIAGNOSIS — Z791 Long term (current) use of non-steroidal anti-inflammatories (NSAID): Secondary | ICD-10-CM | POA: Insufficient documentation

## 2017-10-22 DIAGNOSIS — F515 Nightmare disorder: Secondary | ICD-10-CM | POA: Diagnosis not present

## 2017-10-22 DIAGNOSIS — F329 Major depressive disorder, single episode, unspecified: Secondary | ICD-10-CM | POA: Insufficient documentation

## 2017-10-22 DIAGNOSIS — F322 Major depressive disorder, single episode, severe without psychotic features: Secondary | ICD-10-CM

## 2017-10-22 DIAGNOSIS — E785 Hyperlipidemia, unspecified: Secondary | ICD-10-CM | POA: Insufficient documentation

## 2017-10-22 DIAGNOSIS — Z79890 Hormone replacement therapy (postmenopausal): Secondary | ICD-10-CM | POA: Insufficient documentation

## 2017-10-22 MED ORDER — LEVOTHYROXINE SODIUM 100 MCG PO TABS: 100.0000 ug | ORAL_TABLET | Freq: Every day | ORAL | 3 refills | Status: DC

## 2017-10-22 MED ORDER — PRAZOSIN HCL 1 MG PO CAPS: 1.0000 mg | ORAL_CAPSULE | Freq: Every day | ORAL | 3 refills | Status: DC

## 2017-10-22 MED ORDER — FLUOXETINE HCL 20 MG PO TABS: 20.0000 mg | ORAL_TABLET | Freq: Every day | ORAL | 3 refills | Status: DC

## 2017-10-22 MED ORDER — FLUOXETINE HCL 20 MG PO CAPS: 20.0000 mg | ORAL_CAPSULE | Freq: Every day | ORAL | 3 refills | Status: DC

## 2017-10-22 MED FILL — LEVOTHYROXINE 100 MCG TAB: 100 | 30 days supply | Qty: 30 | Fill #0

## 2017-10-22 MED FILL — FLUoxetine HCL 20 MG CAPS: 20 | 22 days supply | Qty: 30 | Fill #0

## 2017-10-22 MED FILL — PRAZOSIN 1 MG CAPSULE: 1 | 30 days supply | Qty: 30 | Fill #0

## 2017-10-22 NOTE — Progress Notes (Signed)
Subjective:  Patient ID: Kaitlyn Wise, female    DOB: 05/28/1970  Age: 47 y.o. MRN: [id number]  CC: Hypothyroidism   HPI Kaitlyn Wise  is a 48 year old female with a history of hypothyroidism, Depression who presents today for a follow up visit. She was last seen in the clinic 9 months ago and has been off her thyroid medication and antidepressant. States they were ineffective and she continues to experience feelings of depression. Currently seeing a therapist but sessions have been ineffective. Her Depression dates back to the murder of her spouse in [place] in 2006. She has lack of motivations,low energy and sometimes wishes she were not alive but declines having a suicide plan.She has nightmares which affect her sleep. Denies anxiety or panic attacks. Currently does not work and her major support system is a friend who has cancer and he helps her out by driving her to appointments.  Past Medical History:  Diagnosis Date  . Dyslipidemia   . Thyroid disease   . Uterine fibroid   . Vertigo     Past Surgical History:  Procedure Laterality Date  . CHOLECYSTECTOMY    . CHOLECYSTECTOMY      No Known Allergies   Outpatient Medications Prior to Visit  Medication Sig Dispense Refill  . dicyclomine (BENTYL) 10 MG capsule Take 1 capsule (10 mg total) by mouth 4 (four) times daily -  before meals and at bedtime. As needed during flare up/pain (Patient not taking: Reported on 08/01/2017) 60 capsule 1  . fluticasone (FLONASE) 50 MCG/ACT nasal spray Place 2 sprays into both nostrils daily. (Patient not taking: Reported on 10/22/2017) 16 g 0  . ibuprofen (ADVIL,MOTRIN) 800 MG tablet Take 1 tablet (800 mg total) by mouth 3 (three) times daily. (Patient not taking: Reported on 08/01/2017) 21 tablet 0  . metoCLOPramide (REGLAN) 10 MG tablet Take 1 tablet (10 mg total) by mouth every 6 (six) hours as needed for nausea (nausea/headache). (Patient not taking: Reported on 10/22/2017) 12 tablet 0  .  naproxen (NAPROSYN) 500 MG tablet Take 1 tablet (500 mg total) by mouth 2 (two) times daily as needed for mild pain, moderate pain or headache (TAKE WITH MEALS.). (Patient not taking: Reported on 10/22/2017) 20 tablet 0  . topiramate (TOPAMAX) 50 MG tablet Take 1 tablet (50 mg total) by mouth 2 (two) times daily. (Patient not taking: Reported on 04/14/2016) 60 tablet 1  . FLUoxetine (PROZAC) 20 MG tablet Take 1 tablet (20 mg total) by mouth daily. In 2 weeks begin taking 2 tablets daily. (Patient not taking: Reported on 09/05/2017) 90 tablet 1  . levothyroxine (SYNTHROID, LEVOTHROID) 100 MCG tablet Take 1 tablet (100 mcg total) by mouth daily. (Patient not taking: Reported on 09/05/2017) 90 tablet 5   No facility-administered medications prior to visit.     ROS Review of Systems  Constitutional: Negative for activity change, appetite change and fatigue.  HENT: Negative for congestion, sinus pressure and sore throat.   Eyes: Negative for visual disturbance.  Respiratory: Negative for cough, chest tightness, shortness of breath and wheezing.   Cardiovascular: Negative for chest pain and palpitations.  Gastrointestinal: Negative for abdominal distention, abdominal pain and constipation.  Endocrine: Negative for polydipsia.  Genitourinary: Negative for dysuria and frequency.  Musculoskeletal: Negative for arthralgias and back pain.  Skin: Negative for rash.  Neurological: Negative for tremors, light-headedness and numbness.  Hematological: Does not bruise/bleed easily.  Psychiatric/Behavioral: Positive for dysphoric mood. Negative for agitation and behavioral problems.    Objective:  BP 96/64   Pulse (!) 57   Temp (!) 97.5 F (36.4 C) (Oral)   Ht 5' (1.524 m)   Wt 154 lb 3.2 oz (69.9 kg)   SpO2 100%   BMI 30.12 kg/m   BP/Weight 10/22/2017 09/05/2017 10/09/4626  Systolic BP 96 95 638  Diastolic BP 64 58 62  Wt. (Lbs) 154.2 160 160.2  BMI 30.12 31.25 31.29  Some encounter information is  confidential and restricted. Go to Review Flowsheets activity to see all data.      Physical Exam  Constitutional: She is oriented to person, place, and time. She appears well-developed and well-nourished.  HENT:  Right Ear: External ear normal.  Left Ear: External ear normal.  Mouth/Throat: Oropharynx is clear and moist.  Cardiovascular: Normal rate, normal heart sounds and intact distal pulses.  No murmur heard. Pulmonary/Chest: Effort normal and breath sounds normal. She has no wheezes. She has no rales. She exhibits no tenderness.  Abdominal: Soft. Bowel sounds are normal. She exhibits no distension and no mass. There is no tenderness.  Musculoskeletal: Normal range of motion.  Neurological: She is alert and oriented to person, place, and time.  Skin: Skin is warm and dry.  Psychiatric:  Dysphoric mood     Assessment & Plan:   1. Other specified hypothyroidism Uncontrolled as he has been out of medications - levothyroxine (SYNTHROID, LEVOTHROID) 100 MCG tablet; Take 1 tablet (100 mcg total) by mouth daily.  Dispense: 30 tablet; Refill: 3  2. Current severe episode of major depressive disorder without psychotic features without prior episode ([place]) Uncontrolled LCSW called in for counseling She has a therapist whom we will be reaching out to Reassess in 6 weeks and prior to that with LCSW - FLUoxetine (PROZAC) 20 MG tablet; Take 1 tablet (20 mg total) by mouth daily. In 2 weeks begin taking 2 tablets daily.  Dispense: 30 tablet; Refill: 3  3. Nightmare disorder She has had a history of traumatic experiences in her home country including the murder of her husband - prazosin (MINIPRESS) 1 MG capsule; Take 1 capsule (1 mg total) by mouth at bedtime.  Dispense: 30 capsule; Refill: 3   Meds ordered this encounter  Medications  . levothyroxine (SYNTHROID, LEVOTHROID) 100 MCG tablet    Sig: Take 1 tablet (100 mcg total) by mouth daily.    Dispense:  30 tablet    Refill:  3    . FLUoxetine (PROZAC) 20 MG tablet    Sig: Take 1 tablet (20 mg total) by mouth daily. In 2 weeks begin taking 2 tablets daily.    Dispense:  30 tablet    Refill:  3  . prazosin (MINIPRESS) 1 MG capsule    Sig: Take 1 capsule (1 mg total) by mouth at bedtime.    Dispense:  30 capsule    Refill:  3    Follow-up: Return in about 6 weeks (around 12/03/2017) for Follow-up of depression.   Charlott Rakes MD

## 2017-10-24 NOTE — BH Specialist Note (Signed)
Integrated Behavioral Health Initial Visit  MRN: [id number] Name: Kaitlyn Wise  Number of [place] Clinician visits:: 1/6 Session Start time: 3:00 PM  Session End time: 3:20 PM Total time: 20 minutes  Type of Service: [place] Interpretor:Yes.   Interpretor Name and Language: Arabic Reem [id number]   Warm Hand Off Completed.       SUBJECTIVE: Kaitlyn Wise is a 47 y.o. female accompanied by self Patient was referred by Dr. Margarita Rana for depression. Patient reports the following symptoms/concerns: feelings of sadness and worry, difficulty sleeping, crying episodes, and decreased appetite Duration of problem: Ongoing; Severity of problem: severe  OBJECTIVE: Mood: Dysphoric and Affect: Depressed and Tearful Risk of harm to self or others: No plan to harm self or others  LIFE CONTEXT: Family and Social: Pt does not have any family that resides locally. She has a friend, who is visiting her home country and will not return for a couple of months School/Work: Pt has not worked for eleven months Self-Care: Pt has agreed to participate in medication management Life Changes: Pt has hx of trauma and concerns regarding her health  GOALS ADDRESSED: Patient will: 1. Reduce symptoms of: anxiety and depression 2. Increase knowledge and/or ability of: coping skills, healthy habits and self-management skills  3. Demonstrate ability to: Increase adequate support systems for patient/family, Increase motivation to adhere to plan of care and Improve medication compliance  INTERVENTIONS: Interventions utilized: Motivational Interviewing and Supportive Counseling  Standardized Assessments completed: GAD-7 and PHQ 2&9  ASSESSMENT: Patient currently experiencing depression and anxiety triggered by health concerns and hx of trauma. She reports feelings of sadness and worry, difficulty sleeping, low motivation, crying episodes, and decreased  appetite. Pt receives limited support.   Patient receives counseling and has agreed to re-initiate medication management. Pt was educated of correlation between one's physical and mental health. Therapeutic strategies were discussed to improve motivation.  PLAN: 1. Follow up with behavioral health clinician on : Pt was encouraged to contact LCSWA if symptoms worsen or fail to improve to schedule behavioral appointments at [facility]. 2. Behavioral recommendations: LCSWA recommends that pt apply healthy coping skills discussed and comply with medication management. 3. Referral(s): [facility] (In Clinic) 4. "From scale of 1-10, how likely are you to follow plan?":   Rebekah Chesterfield, LCSW 10/24/17 12:43 PM

## 2017-11-04 ENCOUNTER — Ambulatory Visit: Payer: Self-pay | Admitting: Psychology

## 2017-11-28 MED FILL — DICYCLOMINE 10 MG CAPSULE: 10 | 15 days supply | Qty: 60 | Fill #1

## 2017-11-28 MED FILL — LEVOTHYROXINE 100 MCG TAB: 100 | 30 days supply | Qty: 30 | Fill #1

## 2019-05-06 ENCOUNTER — Ambulatory Visit: Payer: 59 | Attending: Family Medicine | Admitting: Family Medicine

## 2019-05-06 ENCOUNTER — Other Ambulatory Visit: Payer: Self-pay

## 2019-05-06 ENCOUNTER — Ambulatory Visit: Payer: 59 | Attending: Family Medicine | Admitting: Licensed Clinical Social Worker

## 2019-05-06 ENCOUNTER — Encounter: Payer: Self-pay | Admitting: Family Medicine

## 2019-05-06 VITALS — BP 106/64 | HR 62 | Ht 60.0 in | Wt 155.2 lb

## 2019-05-06 DIAGNOSIS — E038 Other specified hypothyroidism: Secondary | ICD-10-CM | POA: Diagnosis not present

## 2019-05-06 DIAGNOSIS — N926 Irregular menstruation, unspecified: Secondary | ICD-10-CM | POA: Diagnosis not present

## 2019-05-06 DIAGNOSIS — F322 Major depressive disorder, single episode, severe without psychotic features: Secondary | ICD-10-CM

## 2019-05-06 MED ORDER — FLUOXETINE HCL 20 MG PO CAPS: 20.0000 mg | ORAL_CAPSULE | Freq: Every day | ORAL | 3 refills | Status: DC

## 2019-05-06 MED ORDER — LEVOTHYROXINE SODIUM 100 MCG PO TABS: 100.0000 ug | ORAL_TABLET | Freq: Every day | ORAL | 3 refills | Status: DC

## 2019-05-06 MED ORDER — HYDROXYZINE HCL 25 MG PO TABS: 25.0000 mg | ORAL_TABLET | Freq: Every day | ORAL | 3 refills | Status: DC

## 2019-05-06 MED FILL — FLUoxetine HCL 20 MG CAPS: 20 | 30 days supply | Qty: 30 | Fill #0

## 2019-05-06 MED FILL — LEVOTHYROXINE SODIUM 100 MC: 100 | 30 days supply | Qty: 30 | Fill #0

## 2019-05-06 MED FILL — hydrOXYzine HCL 25 MG TABS: 25 | 30 days supply | Qty: 30 | Fill #0

## 2019-05-06 NOTE — Patient Instructions (Signed)

## 2019-05-06 NOTE — Progress Notes (Signed)
Patient has been out of thyroid medication for a while.  She is having irregular period and they stay on for more that 2 weeks.

## 2019-05-06 NOTE — Progress Notes (Signed)
Subjective:  Patient ID: Kaitlyn Wise, female    DOB: 03/21/1970  Age: 49 y.o. MRN: [id number]  CC: Hypothyroidism   HPI Kaitlyn Wise is a 49 year old female with a history of hypothyroidism, Depression who presents today for a follow up visit.  She has not been seen in the Clinic in the last 2 years. Complains she had been depressed and was seeing a therapist and endorses anhedonia, not feeling like living anymore but denies suicidal intention as she believes in God and would not harm herself. She has been crying a lot as well and also has insomnia.The therapist was not helpful she states. She was previously on Prozac has been a last office visit As a background history husband was murdered in [place] and she currently has a daughter whom she has not been able to see as her daughter's uncle has prevented her from contacting the patient that this has her depressed.  She has been out of Levothyroxine. Complains of abnormal periods. She was amenorrheic for 4 months and this month she has been bleeding for 2 weeks but she is spotting now.  Past Medical History:  Diagnosis Date  . Dyslipidemia   . Thyroid disease   . Uterine fibroid   . Vertigo     Past Surgical History:  Procedure Laterality Date  . CHOLECYSTECTOMY    . CHOLECYSTECTOMY      Family History  Problem Relation Age of Onset  . Diabetes Mother   . Hypertension Mother   . Diabetes Father   . Hypertension Father   . Heart disease Brother     No Known Allergies  Outpatient Medications Prior to Visit  Medication Sig Dispense Refill  . FLUoxetine (PROZAC) 20 MG capsule Take 1 capsule (20 mg total) by mouth daily. Take 2 tablets in 2 weeks 30 capsule 3  . levothyroxine (SYNTHROID, LEVOTHROID) 100 MCG tablet Take 1 tablet (100 mcg total) by mouth daily. 30 tablet 3  . prazosin (MINIPRESS) 1 MG capsule Take 1 capsule (1 mg total) by mouth at bedtime. 30 capsule 3  . dicyclomine (BENTYL) 10 MG capsule Take 1 capsule (10  mg total) by mouth 4 (four) times daily -  before meals and at bedtime. As needed during flare up/pain (Patient not taking: Reported on 08/01/2017) 60 capsule 1  . FLUoxetine (PROZAC) 20 MG tablet Take 1 tablet (20 mg total) by mouth daily. In 2 weeks begin taking 2 tablets daily. 30 tablet 3  . fluticasone (FLONASE) 50 MCG/ACT nasal spray Place 2 sprays into both nostrils daily. (Patient not taking: Reported on 10/22/2017) 16 g 0  . ibuprofen (ADVIL,MOTRIN) 800 MG tablet Take 1 tablet (800 mg total) by mouth 3 (three) times daily. (Patient not taking: Reported on 08/01/2017) 21 tablet 0  . metoCLOPramide (REGLAN) 10 MG tablet Take 1 tablet (10 mg total) by mouth every 6 (six) hours as needed for nausea (nausea/headache). (Patient not taking: Reported on 10/22/2017) 12 tablet 0  . naproxen (NAPROSYN) 500 MG tablet Take 1 tablet (500 mg total) by mouth 2 (two) times daily as needed for mild pain, moderate pain or headache (TAKE WITH MEALS.). (Patient not taking: Reported on 10/22/2017) 20 tablet 0  . topiramate (TOPAMAX) 50 MG tablet Take 1 tablet (50 mg total) by mouth 2 (two) times daily. (Patient not taking: Reported on 04/14/2016) 60 tablet 1   No facility-administered medications prior to visit.     ROS Review of Systems  Constitutional: Negative for activity change, appetite change  and fatigue.  HENT: Negative for congestion, sinus pressure and sore throat.   Eyes: Negative for visual disturbance.  Respiratory: Negative for cough, chest tightness, shortness of breath and wheezing.   Cardiovascular: Negative for chest pain and palpitations.  Gastrointestinal: Negative for abdominal distention, abdominal pain and constipation.  Endocrine: Negative for polydipsia.  Genitourinary: Negative for dysuria and frequency.  Musculoskeletal: Negative for arthralgias and back pain.  Skin: Negative for rash.  Neurological: Negative for tremors, light-headedness and numbness.  Hematological: Does not  bruise/bleed easily.  Psychiatric/Behavioral: Positive for dysphoric mood. Negative for agitation and behavioral problems.    Objective:  BP 106/64   Pulse 62   Ht 5' (1.524 m)   Wt 155 lb 3.2 oz (70.4 kg)   SpO2 98%   BMI 30.31 kg/m   BP/Weight 05/06/2019 10/22/2017 7/59/1638  Systolic BP 466 96 95  Diastolic BP 64 64 58  Wt. (Lbs) 155.2 154.2 160  BMI 30.31 30.12 31.25  Some encounter information is confidential and restricted. Go to Review Flowsheets activity to see all data.      Physical Exam Constitutional:      Appearance: She is well-developed.  Neck:     Vascular: No JVD.  Cardiovascular:     Rate and Rhythm: Normal rate.     Heart sounds: Normal heart sounds. No murmur.  Pulmonary:     Effort: Pulmonary effort is normal.     Breath sounds: Normal breath sounds. No wheezing or rales.  Chest:     Chest wall: No tenderness.  Abdominal:     General: Bowel sounds are normal. There is no distension.     Palpations: Abdomen is soft. There is no mass.     Tenderness: There is no abdominal tenderness.  Musculoskeletal:        General: Normal range of motion.     Right lower leg: No edema.     Left lower leg: No edema.  Neurological:     Mental Status: She is alert and oriented to person, place, and time.  Psychiatric:        Mood and Affect: Mood normal.     CMP Latest Ref Rng & Units 09/05/2017 08/01/2017 04/11/2017  Glucose 70 - 99 mg/dL 101(H) 89 106(H)  BUN 6 - 20 mg/dL 9 15 13   Creatinine 0.44 - 1.00 mg/dL 0.87 0.74 0.73  Sodium 135 - 145 mmol/L 137 139 136  Potassium 3.5 - 5.1 mmol/L 4.4 4.6 3.6  Chloride 98 - 111 mmol/L 105 102 106  CO2 22 - 32 mmol/L 25 22 21(L)  Calcium 8.9 - 10.3 mg/dL 9.0 9.0 9.0  Total Protein 6.5 - 8.1 g/dL 7.7 7.3 7.9  Total Bilirubin 0.3 - 1.2 mg/dL 0.5 <0.2 0.2(L)  Alkaline Phos 38 - 126 U/L 106 92 91  AST 15 - 41 U/L 23 14 21   ALT 0 - 44 U/L 16 12 13(L)    Lipid Panel     Component Value Date/Time   CHOL 195  08/25/2015 1111   TRIG 84 08/25/2015 1111   HDL 67 08/25/2015 1111   CHOLHDL 2.9 08/25/2015 1111   VLDL 17 08/25/2015 1111   LDLCALC 111 08/25/2015 1111    CBC    Component Value Date/Time   WBC 6.0 09/05/2017 1640   RBC 4.25 09/05/2017 1640   HGB 11.4 (L) 09/05/2017 1640   HGB 11.3 08/01/2017 1134   HCT 37.1 09/05/2017 1640   HCT 35.5 08/01/2017 1134   PLT 272 09/05/2017  1640   PLT 305 08/01/2017 1134   MCV 87.3 09/05/2017 1640   MCV 85 08/01/2017 1134   MCH 26.8 09/05/2017 1640   MCHC 30.7 09/05/2017 1640   RDW 15.6 (H) 09/05/2017 1640   RDW 15.8 (H) 08/01/2017 1134   LYMPHSABS 1.6 09/05/2017 1640   LYMPHSABS 1.5 08/01/2017 1134   MONOABS 0.3 09/05/2017 1640   EOSABS 0.1 09/05/2017 1640   EOSABS 0.2 08/01/2017 1134   BASOSABS 0.1 09/05/2017 1640   BASOSABS 0.1 08/01/2017 1134    No results found for: HGBA1C  Assessment & Plan:  1. Other specified hypothyroidism Uncontrolled We will make no regimen change if thyroid lab is abnormal as she has been out of Levothyroxine which have refilled - levothyroxine (SYNTHROID) 100 MCG tablet; Take 1 tablet (100 mcg total) by mouth daily.  Dispense: 30 tablet; Refill: 3 - Lipid panel - CMP14+EGFR - T4, free - TSH  2. Current severe episode of major depressive disorder without psychotic features without prior episode ([place]) Uncontrolled Worsened by underlying family situation She has been out of Prozac which I have refilled LCSW called in for therapy - FLUoxetine (PROZAC) 20 MG capsule; Take 1 capsule (20 mg total) by mouth daily.  Dispense: 30 capsule; Refill: 3 - hydrOXYzine (ATARAX/VISTARIL) 25 MG tablet; Take 1 tablet (25 mg total) by mouth at bedtime.  Dispense: 30 tablet; Refill: 3  3. Irregular periods Could be secondary to uncontrolled hypothyroidism coupled with perimenopausal symptoms If abnormal bleeding resumes consider Provera - CBC with Differential/Platelet   Return in about 6 weeks (around 06/17/2019) for  annual physical .     Charlott Rakes, MD, FAAFP. [facility] [place], [place]   05/06/2019, 10:19 AM

## 2019-05-07 ENCOUNTER — Other Ambulatory Visit: Payer: Self-pay | Admitting: Family Medicine

## 2019-05-07 LAB — CBC WITH DIFFERENTIAL/PLATELET
Basophils Absolute: 0.1 10*3/uL (ref 0.0–0.2)
Basos: 2 %
EOS (ABSOLUTE): 0.5 10*3/uL — ABNORMAL HIGH (ref 0.0–0.4)
Eos: 10 %
Hematocrit: 31.6 % — ABNORMAL LOW (ref 34.0–46.6)
Hemoglobin: 10 g/dL — ABNORMAL LOW (ref 11.1–15.9)
Immature Grans (Abs): 0 10*3/uL (ref 0.0–0.1)
Immature Granulocytes: 0 %
Lymphocytes Absolute: 1.8 10*3/uL (ref 0.7–3.1)
Lymphs: 34 %
MCH: 24.8 pg — ABNORMAL LOW (ref 26.6–33.0)
MCHC: 31.6 g/dL (ref 31.5–35.7)
MCV: 78 fL — ABNORMAL LOW (ref 79–97)
Monocytes Absolute: 0.4 10*3/uL (ref 0.1–0.9)
Monocytes: 7 %
Neutrophils Absolute: 2.4 10*3/uL (ref 1.4–7.0)
Neutrophils: 47 %
Platelets: 242 10*3/uL (ref 150–450)
RBC: 4.04 x10E6/uL (ref 3.77–5.28)
RDW: 17 % — ABNORMAL HIGH (ref 11.7–15.4)
WBC: 5.2 10*3/uL (ref 3.4–10.8)

## 2019-05-07 LAB — T4, FREE: Free T4: 0.15 ng/dL — ABNORMAL LOW (ref 0.82–1.77)

## 2019-05-07 LAB — CMP14+EGFR
ALT: 12 IU/L (ref 0–32)
AST: 22 IU/L (ref 0–40)
Albumin/Globulin Ratio: 1.4 (ref 1.2–2.2)
Albumin: 4.1 g/dL (ref 3.8–4.8)
Alkaline Phosphatase: 129 IU/L — ABNORMAL HIGH (ref 39–117)
BUN/Creatinine Ratio: 15 (ref 9–23)
BUN: 12 mg/dL (ref 6–24)
Bilirubin Total: <0.2 mg/dL (ref 0.0–1.2)
CO2: 22 mmol/L (ref 20–29)
Calcium: 9.1 mg/dL (ref 8.7–10.2)
Chloride: 102 mmol/L (ref 96–106)
Creatinine, Ser: 0.78 mg/dL (ref 0.57–1.00)
GFR calc Af Amer: 105 mL/min/{1.73_m2} (ref >59)
GFR calc non Af Amer: 91 mL/min/{1.73_m2} (ref >59)
Globulin, Total: 3 g/dL (ref 1.5–4.5)
Glucose: 99 mg/dL (ref 65–99)
Potassium: 4.1 mmol/L (ref 3.5–5.2)
Sodium: 136 mmol/L (ref 134–144)
Total Protein: 7.1 g/dL (ref 6.0–8.5)

## 2019-05-07 LAB — LIPID PANEL
Chol/HDL Ratio: 4.2 ratio (ref 0.0–4.4)
Cholesterol, Total: 272 mg/dL — ABNORMAL HIGH (ref 100–199)
HDL: 65 mg/dL (ref >39)
LDL Chol Calc (NIH): 174 mg/dL — ABNORMAL HIGH (ref 0–99)
Triglycerides: 179 mg/dL — ABNORMAL HIGH (ref 0–149)
VLDL Cholesterol Cal: 33 mg/dL (ref 5–40)

## 2019-05-07 LAB — TSH: TSH: 164 u[IU]/mL — ABNORMAL HIGH (ref 0.450–4.500)

## 2019-05-07 MED ORDER — ATORVASTATIN CALCIUM 20 MG PO TABS: 20.0000 mg | ORAL_TABLET | Freq: Every day | ORAL | 3 refills | Status: DC

## 2019-05-07 MED ORDER — MEDROXYPROGESTERONE ACETATE 10 MG PO TABS: 10.0000 mg | ORAL_TABLET | Freq: Every day | ORAL | 0 refills | Status: DC

## 2019-05-07 MED ORDER — FERROUS SULFATE 325 (65 FE) MG PO TBEC: 325.0000 mg | DELAYED_RELEASE_TABLET | Freq: Two times a day (BID) | ORAL | 3 refills | Status: DC | PRN

## 2019-05-07 MED FILL — ATORVASTATIN CALCIUM 20 MG: 20 | 90 days supply | Qty: 90 | Fill #0

## 2019-05-07 MED FILL — MEDROXYPROGESTERONE ACETATE: 10 | 10 days supply | Qty: 10 | Fill #0

## 2019-05-08 ENCOUNTER — Telehealth: Payer: Self-pay

## 2019-05-08 NOTE — Telephone Encounter (Signed)
-----   Message from Charlott Rakes, MD sent at 05/07/2019 11:57 AM EDT ----- Cholesterol is elevated and I have sent a prescription for Lipitor to the pharmacy; low cholesterol diet will be beneficial.  She is also anemic likely from heavy bleed and I have sent a prescription for iron tablets as well as Provera which will stop the bleeding.  Levothyroxine is abnormal likely due to being off levothyroxine for quite a while and I would just encourage her to comply with her Levothyroxine

## 2019-05-08 NOTE — Telephone Encounter (Signed)
Patient name and DOB has been verified Patient was informed of lab results. Patient had no questions.  

## 2019-05-19 NOTE — BH Specialist Note (Signed)
Integrated Behavioral Health Initial Visit  MRN: [id number] Name: Kaitlyn Wise  Number of [place] Clinician visits:: 1/6 Session Start time: 11:00 AM  Session End time: 11:20 AM Total time: 20  Type of Service: [place] Interpretor:Yes.   Interpretor Name and Language: Arabic Thomasenia Sales [phone])   Warm Hand Off Completed.       SUBJECTIVE: Kaitlyn Wise is a 49 y.o. female accompanied by self Patient was referred by Dr. Margarita Rana for depression. Patient reports the following symptoms/concerns: Pt reports increase in depression symptoms triggered by limited support and grief. Pt denies triggers states "just strong sadness from the inside" Duration of problem: Ongoing; Severity of problem: severe  OBJECTIVE: Mood: Anxious and Depressed and Affect: Tearful Risk of harm to self or others: No plan to harm self or others  LIFE CONTEXT: Family and Social: Pt reports no family support School/Work: Pt is employed  Self-Care: Pt is open to re-initiating therapy and medication management through PCP Life Changes: Pt has difficulty managing increase in depression symptoms  GOALS ADDRESSED: Patient will: 1. Reduce symptoms of: depression 2. Increase knowledge and/or ability of: self-management skills  3. Demonstrate ability to: Increase healthy adjustment to current life circumstances and Increase adequate support systems for patient/family  INTERVENTIONS: Interventions utilized: Solution-Focused Strategies and Supportive Counseling  Standardized Assessments completed: GAD-7 and PHQ 2&9  ASSESSMENT: Patient currently experiencing increase in depression symptoms. She has no identified support in the community   Patient may benefit from re-initiation of medication management and therapy. Pt was successful in identifying healthy coping skills (praying, meditation, and self-encouragement) LCSW will refer patient to behavioral health  specialist (preferebly female) that takes insurance  PLAN: 1. Follow up with behavioral health clinician on : 05/20/19 2. Behavioral recommendations: Utilize strategies discussed and comply with medications 3. Referral(s): [place] (In Clinic) and [place] (LME/Outside Clinic) 4. "From scale of 1-10, how likely are you to follow plan?":   Rebekah [place], LCSW 05/19/19 4:16 PM

## 2019-05-20 ENCOUNTER — Other Ambulatory Visit: Payer: Self-pay

## 2019-05-20 ENCOUNTER — Ambulatory Visit: Payer: 59 | Attending: Family Medicine | Admitting: Licensed Clinical Social Worker

## 2019-05-20 DIAGNOSIS — F322 Major depressive disorder, single episode, severe without psychotic features: Secondary | ICD-10-CM

## 2019-06-03 ENCOUNTER — Encounter (HOSPITAL_COMMUNITY): Payer: Self-pay

## 2019-06-03 ENCOUNTER — Other Ambulatory Visit: Payer: Self-pay

## 2019-06-03 ENCOUNTER — Ambulatory Visit (HOSPITAL_COMMUNITY)
Admission: EM | Admit: 2019-06-03 | Discharge: 2019-06-03 | Disposition: A | Discharge status: Home / Self Care | Payer: 59 | Attending: Family Medicine | Admitting: Family Medicine

## 2019-06-03 DIAGNOSIS — Z79899 Other long term (current) drug therapy: Secondary | ICD-10-CM | POA: Diagnosis not present

## 2019-06-03 DIAGNOSIS — R110 Nausea: Secondary | ICD-10-CM | POA: Insufficient documentation

## 2019-06-03 DIAGNOSIS — Z20822 Contact with and (suspected) exposure to covid-19: Secondary | ICD-10-CM | POA: Insufficient documentation

## 2019-06-03 DIAGNOSIS — E785 Hyperlipidemia, unspecified: Secondary | ICD-10-CM | POA: Insufficient documentation

## 2019-06-03 DIAGNOSIS — R509 Fever, unspecified: Secondary | ICD-10-CM | POA: Diagnosis present

## 2019-06-03 DIAGNOSIS — E039 Hypothyroidism, unspecified: Secondary | ICD-10-CM | POA: Insufficient documentation

## 2019-06-03 DIAGNOSIS — R05 Cough: Secondary | ICD-10-CM | POA: Insufficient documentation

## 2019-06-03 DIAGNOSIS — Z793 Long term (current) use of hormonal contraceptives: Secondary | ICD-10-CM | POA: Diagnosis not present

## 2019-06-03 DIAGNOSIS — F329 Major depressive disorder, single episode, unspecified: Secondary | ICD-10-CM | POA: Diagnosis not present

## 2019-06-03 DIAGNOSIS — G43909 Migraine, unspecified, not intractable, without status migrainosus: Secondary | ICD-10-CM | POA: Diagnosis not present

## 2019-06-03 DIAGNOSIS — B349 Viral infection, unspecified: Secondary | ICD-10-CM | POA: Diagnosis not present

## 2019-06-03 MED ORDER — ONDANSETRON 4 MG PO TBDP
ORAL_TABLET | ORAL | Status: AC
  Filled 2019-06-03: qty 1

## 2019-06-03 MED ORDER — ONDANSETRON 4 MG PO TBDP
4.0000 mg | ORAL_TABLET | Freq: Once | ORAL | Status: AC
  Administered 2019-06-03: 4 mg via ORAL

## 2019-06-03 NOTE — ED Triage Notes (Signed)
Pt c/o fever, nausea, cough, HA, neck pain/stiffness for approx 1 week; taking tylenol with improvement of fever. Reports Tmax 101.  States neck pain improved with massage.

## 2019-06-03 NOTE — Discharge Instructions (Signed)
This is most likely some sort of viral illness. Highly suspicious for Covid at this time.  Recommend quarantine while your Covid swab is pending You can keep doing the medications as needed for symptoms. Zofran given here for nausea Follow up as needed for continued or worsening symptoms

## 2019-06-03 NOTE — ED Provider Notes (Signed)
[facility]    CSN: [id number] Arrival date & time: 06/03/19  0840      History   Chief Complaint Chief Complaint  Patient presents with  . Fever    HPI Kaitlyn Wise is a 49 y.o. female.   Patient is a 49 year old female that presents today with fever, nausea, cough, headache, neck pain, body aches.  Symptoms have been constant, waxing waning over the past week.  Has been doing Tylenol, ibuprofen and massage with some relief of symptoms.  Reporting oximetry was 101.  No current neck pain today.  Has not taken any medications today besides her thyroid medication.  Has missed work all week due to her symptoms.  Mild nausea.  No abdominal pain, back pain, vomiting or diarrhea.  ROS per HPI        Past Medical History:  Diagnosis Date  . Dyslipidemia   . Thyroid disease   . Uterine fibroid   . Vertigo     Patient Active Problem List   Diagnosis Date Noted  . Chest pain in adult 08/01/2017  . Costochondritis 08/01/2017  . Current severe episode of major depressive disorder without psychotic features without prior episode ([place]) 08/01/2017  . Mild episode of recurrent major depressive disorder ([place]) 01/18/2017  . Migraine 12/05/2015  . Gastritis and gastroduodenitis 03/09/2015  . Dyslipidemia 06/16/2013  . Hypothyroidism 05/21/2013    Past Surgical History:  Procedure Laterality Date  . CHOLECYSTECTOMY    . CHOLECYSTECTOMY      OB History   No obstetric history on file.      Home Medications    Prior to Admission medications   Medication Sig Start Date End Date Taking? Authorizing Provider  atorvastatin (LIPITOR) 20 MG tablet Take 1 tablet (20 mg total) by mouth daily. 05/07/19  Yes Charlott Rakes, MD  ibuprofen (ADVIL,MOTRIN) 800 MG tablet Take 1 tablet (800 mg total) by mouth 3 (three) times daily. 04/11/17  Yes Pisciotta, Elmyra Ricks, PA-C  levothyroxine (SYNTHROID) 100 MCG tablet Take 1 tablet (100 mcg total) by mouth daily. 05/06/19  Yes Charlott Rakes, MD  ferrous sulfate 325 (65 FE) MG EC tablet Take 1 tablet (325 mg total) by mouth 2 (two) times daily as needed. 05/07/19   Charlott Rakes, MD  FLUoxetine (PROZAC) 20 MG capsule Take 1 capsule (20 mg total) by mouth daily. 05/06/19   Charlott Rakes, MD  hydrOXYzine (ATARAX/VISTARIL) 25 MG tablet Take 1 tablet (25 mg total) by mouth at bedtime. 05/06/19   Charlott Rakes, MD  medroxyPROGESTERone (PROVERA) 10 MG tablet Take 1 tablet (10 mg total) by mouth daily. 05/07/19   Charlott Rakes, MD  dicyclomine (BENTYL) 10 MG capsule Take 1 capsule (10 mg total) by mouth 4 (four) times daily -  before meals and at bedtime. As needed during flare up/pain Patient not taking: Reported on 08/01/2017 04/05/17 06/03/19  Rutherford Guys, MD  fluticasone [facility]) 50 MCG/ACT nasal spray Place 2 sprays into both nostrils daily. Patient not taking: Reported on 10/22/2017 09/05/17 06/03/19  Street, [place], PA-C  prazosin (MINIPRESS) 1 MG capsule Take 1 capsule (1 mg total) by mouth at bedtime. 10/22/17 06/03/19  Charlott Rakes, MD  topiramate (TOPAMAX) 50 MG tablet Take 1 tablet (50 mg total) by mouth 2 (two) times daily. Patient not taking: Reported on 04/14/2016 11/29/15 06/03/19  Charlott Rakes, MD    Family History Family History  Problem Relation Age of Onset  . Diabetes Mother   . Hypertension Mother   . Diabetes  Father   . Hypertension Father   . Heart disease Brother     Social History Social History   Tobacco Use  . Smoking status: Never Smoker  . Smokeless tobacco: Never Used  Substance Use Topics  . Alcohol use: No  . Drug use: No     Allergies   Patient has no known allergies.   Review of Systems Review of Systems   Physical Exam Triage Vital Signs ED Triage Vitals  Enc Vitals Group     BP 06/03/19 0957 (!) 97/42     Pulse Rate 06/03/19 0957 68     Resp 06/03/19 0957 18     Temp 06/03/19 0958 97.8 F (36.6 C)     Temp Source 06/03/19 0957 Oral     SpO2 06/03/19 0957  100 %     Weight --      Height --      Head Circumference --      Peak Flow --      Pain Score --      Pain Loc --      Pain Edu? --      Excl. in [place]? --    No data found.  Updated Vital Signs BP (!) 97/42 (BP Location: Left Arm)   Pulse 68   Temp 97.8 F (36.6 C) (Oral)   Resp 18   LMP 05/03/2019 (Approximate)   SpO2 100%   Visual Acuity Right Eye Distance:   Left Eye Distance:   Bilateral Distance:    Right Eye Near:   Left Eye Near:    Bilateral Near:     Physical Exam Vitals and nursing note reviewed.  Constitutional:      General: She is not in acute distress.    Appearance: Normal appearance. She is not ill-appearing, toxic-appearing or diaphoretic.  HENT:     Head: Normocephalic.     Right Ear: Tympanic membrane and ear canal normal.     Left Ear: Tympanic membrane and ear canal normal.     Nose: Nose normal.     Mouth/Throat:     Pharynx: Oropharynx is clear.  Eyes:     Conjunctiva/sclera: Conjunctivae normal.  Cardiovascular:     Rate and Rhythm: Normal rate and regular rhythm.  Pulmonary:     Effort: Pulmonary effort is normal.     Breath sounds: Normal breath sounds.  Musculoskeletal:        General: Normal range of motion.     Cervical back: Normal range of motion.  Skin:    General: Skin is warm and dry.     Findings: No rash.  Neurological:     Mental Status: She is alert.  Psychiatric:        Mood and Affect: Mood normal.      UC Treatments / Results  Labs (all labs ordered are listed, but only abnormal results are displayed) Labs Reviewed  SARS CORONAVIRUS 2 (TAT 6-24 HRS)    EKG   Radiology No results found.  Procedures Procedures (including critical care time)  Medications Ordered in UC Medications  ondansetron (ZOFRAN-ODT) disintegrating tablet 4 mg (4 mg Oral Given 06/03/19 1008)    Initial Impression / Assessment and Plan / UC Course  I have reviewed the triage vital signs and the nursing notes.  Pertinent  labs & imaging results that were available during my care of the patient were reviewed by me and considered in my medical decision making (see chart for details).  Viral illness Highly suspicious for Covid Continue the medication over-the-counter as needed Zofran given here for nausea Follow up as needed for continued or worsening symptoms Work note given  Final Clinical Impressions(s) / UC Diagnoses   Final diagnoses:  Viral illness     Discharge Instructions     This is most likely some sort of viral illness. Highly suspicious for Covid at this time.  Recommend quarantine while your Covid swab is pending You can keep doing the medications as needed for symptoms. Zofran given here for nausea Follow up as needed for continued or worsening symptoms     ED Prescriptions    None     PDMP not reviewed this encounter.   Loura Halt A, NP 06/03/19 1019

## 2019-06-04 LAB — SARS CORONAVIRUS 2 (TAT 6-24 HRS): SARS Coronavirus 2: NEGATIVE

## 2019-06-08 MED FILL — LEVOTHYROXINE SODIUM 100 MC: 100 | 30 days supply | Qty: 30 | Fill #1

## 2019-06-15 NOTE — Progress Notes (Signed)
[place] Visit via Telemedicine (Telephone)  05/20/2019 Kaitlyn Wise [id number]   Session Start time: 10:10 AM  Session End time: 10:20 AM Total time: 10  Referring Provider: Dr. Margarita Wise Type of Visit: Telephonic Patient location: Home [facility] Provider location: Office All persons participating in visit: LCSW and Patient  Confirmed patient's address: Yes  Confirmed patient's phone number: Yes  Any changes to demographics: No   Confirmed patient's insurance: Yes  Any changes to patient's insurance: No   Discussed confidentiality: Yes    The following statements were read to the patient and/or legal guardian that are established with the [facility] Provider.  "The purpose of this phone visit is to provide behavioral health care while limiting exposure to the coronavirus (COVID19).  There is a possibility of technology failure and discussed alternative modes of communication if that failure occurs."  "By engaging in this telephone visit, you consent to the provision of healthcare.  Additionally, you authorize for your insurance to be billed for the services provided during this telephone visit."   Patient and/or legal guardian consented to telephone visit: Yes   PRESENTING CONCERNS: Patient and/or family reports the following symptoms/concerns: Pt reports compliance with medication management. Shared that she has been utilizing healthy coping skills and has not observed an increase in mental health symptoms Duration of problem: Ongoing; Severity of problem: severe  STRENGTHS (Protective Factors/Coping Skills): Pt has desire to change Pt is participating in medication management  GOALS ADDRESSED: Patient will: 1.  Reduce symptoms of: anxiety and depression  2.  Increase knowledge and/or ability of: self-management skills  3.  Demonstrate ability to: Increase healthy adjustment to current life circumstances and Increase adequate support systems for  patient/family  INTERVENTIONS: Interventions utilized:  Solution-Focused Strategies Standardized Assessments completed: Not Needed  ASSESSMENT: Patient currently experiencing difficulty managing mental health symptoms. States difficulty sleeping.  Patient may benefit from continued medication management. Pt reports no side effects or increase in MH symptoms. LCSW encouraged patient to take hydroxyzine as prescribed by provider to assist with sleep concerns.   PLAN: 1. Follow up with behavioral health clinician on : Contact LCSW with any additional behavioral health and/or resource needs 2. Behavioral recommendations: Continue utilizing healthy coping skills and complying with medication management 3. Referral(s): [place] (In Clinic)  Kaitlyn Wise, [place] 06/15/2019 7:54AM

## 2019-07-08 ENCOUNTER — Ambulatory Visit: Payer: 59 | Attending: Family Medicine | Admitting: Family Medicine

## 2019-07-08 ENCOUNTER — Encounter: Payer: Self-pay | Admitting: Family Medicine

## 2019-07-08 ENCOUNTER — Other Ambulatory Visit: Payer: Self-pay

## 2019-07-08 VITALS — BP 99/64 | HR 64 | Ht 60.0 in | Wt 147.6 lb

## 2019-07-08 DIAGNOSIS — N644 Mastodynia: Secondary | ICD-10-CM | POA: Diagnosis not present

## 2019-07-08 DIAGNOSIS — Z0000 Encounter for general adult medical examination without abnormal findings: Secondary | ICD-10-CM | POA: Diagnosis not present

## 2019-07-08 DIAGNOSIS — R1011 Right upper quadrant pain: Secondary | ICD-10-CM

## 2019-07-08 DIAGNOSIS — N939 Abnormal uterine and vaginal bleeding, unspecified: Secondary | ICD-10-CM | POA: Diagnosis not present

## 2019-07-08 DIAGNOSIS — E038 Other specified hypothyroidism: Secondary | ICD-10-CM | POA: Diagnosis not present

## 2019-07-08 DIAGNOSIS — Z1159 Encounter for screening for other viral diseases: Secondary | ICD-10-CM

## 2019-07-08 MED ORDER — PANTOPRAZOLE SODIUM 40 MG PO TBEC: 40.0000 mg | DELAYED_RELEASE_TABLET | Freq: Every day | ORAL | 3 refills | Status: DC

## 2019-07-08 MED ORDER — MEDROXYPROGESTERONE ACETATE 10 MG PO TABS: 10.0000 mg | ORAL_TABLET | Freq: Every day | ORAL | 0 refills | Status: DC

## 2019-07-08 MED ORDER — FERROUS SULFATE 325 (65 FE) MG PO TBEC: 325.0000 mg | DELAYED_RELEASE_TABLET | Freq: Two times a day (BID) | ORAL | 3 refills | Status: DC | PRN

## 2019-07-08 MED FILL — MEDROXYPROGESTERONE 10 MG T: 10 | 10 days supply | Qty: 10 | Fill #0

## 2019-07-08 MED FILL — LEVOTHYROXINE SODIUM 100 MC: 100 | 30 days supply | Qty: 30 | Fill #2

## 2019-07-08 MED FILL — PANTOPRAZOLE SOD DR 40 MG T: 40 | 30 days supply | Qty: 30 | Fill #0

## 2019-07-08 NOTE — Progress Notes (Signed)
Subjective:  Patient ID: Kaitlyn Wise, female    DOB: 07-04-1970  Age: 49 y.o. MRN: [id number]  CC: Annual Exam and Gynecologic Exam   HPI Annette Gamel presents for complete physical exam.  She has been bleeding for a month and it has occurred up to about three times  in the past.  At her last visit in 04/2019 Provera was prescribed however she is unsure if she took it.  She also endorses intermittent dizziness.  Ferrous sulfate tablets were also prescribed due to anemia and she does not recall taking that either. She has epigastric and RUQ pain worse when she eats.  History significant for previous cholecystectomy.  Denies presence of nausea, reflux, diarrhea, vomiting, hematochezia or melena. Past Medical History:  Diagnosis Date  . Dyslipidemia   . Thyroid disease   . Uterine fibroid   . Vertigo     Past Surgical History:  Procedure Laterality Date  . CHOLECYSTECTOMY    . CHOLECYSTECTOMY      Family History  Problem Relation Age of Onset  . Diabetes Mother   . Hypertension Mother   . Diabetes Father   . Hypertension Father   . Heart disease Brother     No Known Allergies  Outpatient Medications Prior to Visit  Medication Sig Dispense Refill  . atorvastatin (LIPITOR) 20 MG tablet Take 1 tablet (20 mg total) by mouth daily. 30 tablet 3  . ferrous sulfate 325 (65 FE) MG EC tablet Take 1 tablet (325 mg total) by mouth 2 (two) times daily as needed. 60 tablet 3  . FLUoxetine (PROZAC) 20 MG capsule Take 1 capsule (20 mg total) by mouth daily. 30 capsule 3  . hydrOXYzine (ATARAX/VISTARIL) 25 MG tablet Take 1 tablet (25 mg total) by mouth at bedtime. 30 tablet 3  . ibuprofen (ADVIL,MOTRIN) 800 MG tablet Take 1 tablet (800 mg total) by mouth 3 (three) times daily. 21 tablet 0  . levothyroxine (SYNTHROID) 100 MCG tablet Take 1 tablet (100 mcg total) by mouth daily. 30 tablet 3  . medroxyPROGESTERone (PROVERA) 10 MG tablet Take 1 tablet (10 mg total) by mouth daily. 10 tablet 0    No facility-administered medications prior to visit.     ROS Review of Systems  Constitutional: Negative for activity change, appetite change and fatigue.  HENT: Negative for congestion, sinus pressure and sore throat.   Eyes: Negative for visual disturbance.  Respiratory: Negative for cough, chest tightness, shortness of breath and wheezing.   Cardiovascular: Negative for chest pain and palpitations.  Gastrointestinal: Positive for abdominal pain. Negative for abdominal distention and constipation.  Endocrine: Negative for polydipsia.  Genitourinary: Positive for menstrual problem. Negative for dysuria and frequency.  Musculoskeletal: Negative for arthralgias and back pain.  Skin: Negative for rash.  Neurological: Negative for tremors, light-headedness and numbness.  Hematological: Does not bruise/bleed easily.  Psychiatric/Behavioral: Negative for agitation and behavioral problems.    Objective:  BP 99/64   Pulse 64   Ht 5' (1.524 m)   Wt 147 lb 9.6 oz (67 kg)   SpO2 100%   BMI 28.83 kg/m   BP/Weight 07/08/2019 06/03/2019 02/03/4172  Systolic BP 99 97 081  Diastolic BP 64 42 64  Wt. (Lbs) 147.6 - 155.2  BMI 28.83 - 30.31  Some encounter information is confidential and restricted. Go to Review Flowsheets activity to see all data.      Physical Exam Constitutional:      General: She is not in acute distress.  Appearance: She is well-developed. She is not diaphoretic.  HENT:     Head: Normocephalic.     Right Ear: External ear normal.     Left Ear: External ear normal.     Nose: Nose normal.  Eyes:     Conjunctiva/sclera: Conjunctivae normal.     Pupils: Pupils are equal, round, and reactive to light.  Neck:     Vascular: No JVD.  Cardiovascular:     Rate and Rhythm: Normal rate and regular rhythm.     Heart sounds: Normal heart sounds. No murmur heard.  No gallop.   Pulmonary:     Effort: Pulmonary effort is normal. No respiratory distress.     Breath  sounds: Normal breath sounds. No wheezing or rales.  Chest:     Chest wall: No tenderness.     Breasts:        Right: Tenderness present. No mass.        Left: Tenderness present. No mass.  Abdominal:     General: Bowel sounds are normal. There is no distension.     Palpations: Abdomen is soft. There is no mass.     Tenderness: There is abdominal tenderness (slight RUQ, epigastric).  Musculoskeletal:        General: No tenderness. Normal range of motion.     Cervical back: Normal range of motion.  Skin:    General: Skin is warm and dry.  Neurological:     Mental Status: She is alert and oriented to person, place, and time.     Deep Tendon Reflexes: Reflexes are normal and symmetric.     CMP Latest Ref Rng & Units 05/06/2019 09/05/2017 08/01/2017  Glucose 65 - 99 mg/dL 99 101(H) 89  BUN 6 - 24 mg/dL 12 9 15   Creatinine 0.57 - 1.00 mg/dL 0.78 0.87 0.74  Sodium 134 - 144 mmol/L 136 137 139  Potassium 3.5 - 5.2 mmol/L 4.1 4.4 4.6  Chloride 96 - 106 mmol/L 102 105 102  CO2 20 - 29 mmol/L 22 25 22   Calcium 8.7 - 10.2 mg/dL 9.1 9.0 9.0  Total Protein 6.0 - 8.5 g/dL 7.1 7.7 7.3  Total Bilirubin 0.0 - 1.2 mg/dL <0.2 0.5 <0.2  Alkaline Phos 39 - 117 IU/L 129(H) 106 92  AST 0 - 40 IU/L 22 23 14   ALT 0 - 32 IU/L 12 16 12     Lipid Panel     Component Value Date/Time   CHOL 272 (H) 05/06/2019 1100   TRIG 179 (H) 05/06/2019 1100   HDL 65 05/06/2019 1100   CHOLHDL 4.2 05/06/2019 1100   CHOLHDL 2.9 08/25/2015 1111   VLDL 17 08/25/2015 1111   LDLCALC 174 (H) 05/06/2019 1100    CBC    Component Value Date/Time   WBC 5.2 05/06/2019 1100   WBC 6.0 09/05/2017 1640   RBC 4.04 05/06/2019 1100   RBC 4.25 09/05/2017 1640   HGB 10.0 (L) 05/06/2019 1100   HCT 31.6 (L) 05/06/2019 1100   PLT 242 05/06/2019 1100   MCV 78 (L) 05/06/2019 1100   MCH 24.8 (L) 05/06/2019 1100   MCH 26.8 09/05/2017 1640   MCHC 31.6 05/06/2019 1100   MCHC 30.7 09/05/2017 1640   RDW 17.0 (H) 05/06/2019 1100    LYMPHSABS 1.8 05/06/2019 1100   MONOABS 0.3 09/05/2017 1640   EOSABS 0.5 (H) 05/06/2019 1100   BASOSABS 0.1 05/06/2019 1100    No results found for: HGBA1C  Assessment & Plan:   1. Annual  physical exam Counseled on 150 minutes of exercise per week, healthy eating (including decreased daily intake of saturated fats, cholesterol, added sugars, sodium), routine healthcare maintenance.   2. Mastodynia, female States breast tenderness occurs when she is having her menstrual cycles which is presently on. - MM DIAG BREAST TOMO BILATERAL; Future - US BREAST LTD UNI LEFT INC AXILLA; Future - US BREAST LTD UNI RIGHT INC AXILLA; Future  3. Abnormal uterine bleeding Short course of medroxyprogesterone Pap smear in 2 weeks and will order pelvic ultrasound at that time to exclude fibroids - medroxyPROGESTERone (PROVERA) 10 MG tablet; Take 1 tablet (10 mg total) by mouth daily.  Dispense: 10 tablet; Refill: 0 - CBC with Differential/Platelet - ferrous sulfate 325 (65 FE) MG EC tablet; Take 1 tablet (325 mg total) by mouth 2 (two) times daily as needed.  Dispense: 60 tablet; Refill: 3  4. Other specified hypothyroidism Uncontrolled with elevated TSH at last visit. We will repeat level today - T4, free - TSH  5. Screening for viral disease - HIV Antibody (routine testing w rflx) - HCV RNA quant rflx ultra or genotyp([facility])  6. Right upper quadrant abdominal pain Status post cholecystectomy We will treat presumptively as gastritis; consider imaging if unrelenting - pantoprazole (PROTONIX) 40 MG tablet; Take 1 tablet (40 mg total) by mouth daily.  Dispense: 30 tablet; Refill: 3   Return in about 2 weeks (around 07/22/2019) for PAP smear.   Charlott Rakes, MD, FAAFP. [facility] [place], [place]   07/08/2019, 10:13 AM

## 2019-07-08 NOTE — Patient Instructions (Signed)
Health Maintenance, Female Adopting a healthy lifestyle and getting preventive care are important in promoting health and wellness. Ask your health care provider about:  The right schedule for you to have regular tests and exams.  Things you can do on your own to prevent diseases and keep yourself healthy. What should I know about diet, weight, and exercise? Eat a healthy diet   Eat a diet that includes plenty of vegetables, fruits, low-fat dairy products, and lean protein.  Do not eat a lot of foods that are high in solid fats, added sugars, or sodium. Maintain a healthy weight Body mass index (BMI) is used to identify weight problems. It estimates body fat based on height and weight. Your health care provider can help determine your BMI and help you achieve or maintain a healthy weight. Get regular exercise Get regular exercise. This is one of the most important things you can do for your health. Most adults should:  Exercise for at least 150 minutes each week. The exercise should increase your heart rate and make you sweat (moderate-intensity exercise).  Do strengthening exercises at least twice a week. This is in addition to the moderate-intensity exercise.  Spend less time sitting. Even light physical activity can be beneficial. Watch cholesterol and blood lipids Have your blood tested for lipids and cholesterol at 47 years of age, then have this test every 5 years. Have your cholesterol levels checked more often if:  Your lipid or cholesterol levels are high.  You are older than 47 years of age.  You are at high risk for heart disease. What should I know about cancer screening? Depending on your health history and family history, you may need to have cancer screening at various ages. This may include screening for:  Breast cancer.  Cervical cancer.  Colorectal cancer.  Skin cancer.  Lung cancer. What should I know about heart disease, diabetes, and high blood  pressure? Blood pressure and heart disease  High blood pressure causes heart disease and increases the risk of stroke. This is more likely to develop in people who have high blood pressure readings, are of African descent, or are overweight.  Have your blood pressure checked: ? Every 3-5 years if you are 18-39 years of age. ? Every year if you are 40 years old or older. Diabetes Have regular diabetes screenings. This checks your fasting blood sugar level. Have the screening done:  Once every three years after age 40 if you are at a normal weight and have a low risk for diabetes.  More often and at a younger age if you are overweight or have a high risk for diabetes. What should I know about preventing infection? Hepatitis B If you have a higher risk for hepatitis B, you should be screened for this virus. Talk with your health care provider to find out if you are at risk for hepatitis B infection. Hepatitis C Testing is recommended for:  Everyone born from 1945 through 1965.  Anyone with known risk factors for hepatitis C. Sexually transmitted infections (STIs)  Get screened for STIs, including gonorrhea and chlamydia, if: ? You are sexually active and are younger than 47 years of age. ? You are older than 47 years of age and your health care provider tells you that you are at risk for this type of infection. ? Your sexual activity has changed since you were last screened, and you are at increased risk for chlamydia or gonorrhea. Ask your health care provider if   you are at risk.  Ask your health care provider about whether you are at high risk for HIV. Your health care provider may recommend a prescription medicine to help prevent HIV infection. If you choose to take medicine to prevent HIV, you should first get tested for HIV. You should then be tested every 3 months for as long as you are taking the medicine. Pregnancy  If you are about to stop having your period (premenopausal) and  you may become pregnant, seek counseling before you get pregnant.  Take 400 to 800 micrograms (mcg) of folic acid every day if you become pregnant.  Ask for birth control (contraception) if you want to prevent pregnancy. Osteoporosis and menopause Osteoporosis is a disease in which the bones lose minerals and strength with aging. This can result in bone fractures. If you are 65 years old or older, or if you are at risk for osteoporosis and fractures, ask your health care provider if you should:  Be screened for bone loss.  Take a calcium or vitamin D supplement to lower your risk of fractures.  Be given hormone replacement therapy (HRT) to treat symptoms of menopause. Follow these instructions at home: Lifestyle  Do not use any products that contain nicotine or tobacco, such as cigarettes, e-cigarettes, and chewing tobacco. If you need help quitting, ask your health care provider.  Do not use street drugs.  Do not share needles.  Ask your health care provider for help if you need support or information about quitting drugs. Alcohol use  Do not drink alcohol if: ? Your health care provider tells you not to drink. ? You are pregnant, may be pregnant, or are planning to become pregnant.  If you drink alcohol: ? Limit how much you use to 0-1 drink a day. ? Limit intake if you are breastfeeding.  Be aware of how much alcohol is in your drink. In the [place] one drink equals one 12 oz bottle of beer (355 mL), one 5 oz glass of wine (148 mL), or one 1 oz glass of hard liquor (44 mL). General instructions  Schedule regular health, dental, and eye exams.  Stay current with your vaccines.  Tell your health care provider if: ? You often feel depressed. ? You have ever been abused or do not feel safe at home. Summary  Adopting a healthy lifestyle and getting preventive care are important in promoting health and wellness.  Follow your health care provider's instructions about healthy  diet, exercising, and getting tested or screened for diseases.  Follow your health care provider's instructions on monitoring your cholesterol and blood pressure. This information is not intended to replace advice given to you by your health care provider. Make sure you discuss any questions you have with your health care provider. Document Revised: 12/25/2017 Document Reviewed: 12/25/2017 Elsevier Patient Education  2020 Elsevier Inc.  

## 2019-07-09 LAB — CBC WITH DIFFERENTIAL/PLATELET
Basophils Absolute: 0.1 10*3/uL (ref 0.0–0.2)
Basos: 2 %
EOS (ABSOLUTE): 0.4 10*3/uL (ref 0.0–0.4)
Eos: 7 %
Hematocrit: 31.8 % — ABNORMAL LOW (ref 34.0–46.6)
Hemoglobin: 9.7 g/dL — ABNORMAL LOW (ref 11.1–15.9)
Immature Grans (Abs): 0 10*3/uL (ref 0.0–0.1)
Immature Granulocytes: 0 %
Lymphocytes Absolute: 1.7 10*3/uL (ref 0.7–3.1)
Lymphs: 36 %
MCH: 24.7 pg — ABNORMAL LOW (ref 26.6–33.0)
MCHC: 30.5 g/dL — ABNORMAL LOW (ref 31.5–35.7)
MCV: 81 fL (ref 79–97)
Monocytes Absolute: 0.3 10*3/uL (ref 0.1–0.9)
Monocytes: 7 %
Neutrophils Absolute: 2.2 10*3/uL (ref 1.4–7.0)
Neutrophils: 48 %
Platelets: 258 10*3/uL (ref 150–450)
RBC: 3.92 x10E6/uL (ref 3.77–5.28)
RDW: 15.1 % (ref 11.7–15.4)
WBC: 4.7 10*3/uL (ref 3.4–10.8)

## 2019-07-09 LAB — HCV RNA QUANT RFLX ULTRA OR GENOTYP: HCV Quant Baseline: NOT DETECTED IU/mL

## 2019-07-09 LAB — T4, FREE: Free T4: 1.56 ng/dL (ref 0.82–1.77)

## 2019-07-09 LAB — HIV ANTIBODY (ROUTINE TESTING W REFLEX): HIV Screen 4th Generation wRfx: NONREACTIVE

## 2019-07-09 LAB — TSH: TSH: 0.608 u[IU]/mL (ref 0.450–4.500)

## 2019-07-10 ENCOUNTER — Other Ambulatory Visit: Payer: Self-pay | Admitting: Family Medicine

## 2019-07-10 DIAGNOSIS — E038 Other specified hypothyroidism: Secondary | ICD-10-CM

## 2019-07-10 MED ORDER — LEVOTHYROXINE SODIUM 100 MCG PO TABS: 100.0000 ug | ORAL_TABLET | Freq: Every day | ORAL | 3 refills | Status: DC

## 2019-07-16 ENCOUNTER — Telehealth: Payer: Self-pay

## 2019-07-16 DIAGNOSIS — N939 Abnormal uterine and vaginal bleeding, unspecified: Secondary | ICD-10-CM

## 2019-07-16 NOTE — Telephone Encounter (Signed)
-----   Message from Charlott Rakes, MD sent at 07/10/2019  8:50 AM EDT ----- She is still anemic due to her heavy blood loss and I will recommend continuing her iron tablets. Thyroid labs are normal.

## 2019-07-16 NOTE — Telephone Encounter (Signed)
Patient states that she has finished the medication and she is still having heavy bleeding.

## 2019-07-16 NOTE — Telephone Encounter (Signed)
Patient name and DOB has been verified Patient was informed of lab results. Patient had no questions.  

## 2019-07-17 MED ORDER — MEDROXYPROGESTERONE ACETATE 10 MG PO TABS: 10.0000 mg | ORAL_TABLET | Freq: Every day | ORAL | 0 refills | Status: DC

## 2019-07-17 MED FILL — MEDROXYPROGESTERONE 10 MG T: 10 | 10 days supply | Qty: 10 | Fill #0

## 2019-07-17 NOTE — Addendum Note (Signed)
Addended by: Charlott Rakes on: 07/17/2019 08:45 AM   Modules accepted: Orders

## 2019-07-17 NOTE — Telephone Encounter (Signed)
I have sent an additional refill for her Provera.

## 2019-07-22 ENCOUNTER — Ambulatory Visit (HOSPITAL_COMMUNITY)
Admission: EM | Admit: 2019-07-22 | Discharge: 2019-07-22 | Disposition: A | Discharge status: Home / Self Care | Payer: 59 | Attending: Family Medicine | Admitting: Family Medicine

## 2019-07-22 ENCOUNTER — Other Ambulatory Visit: Payer: Self-pay

## 2019-07-22 ENCOUNTER — Encounter (HOSPITAL_COMMUNITY): Payer: Self-pay | Admitting: Emergency Medicine

## 2019-07-22 DIAGNOSIS — N924 Excessive bleeding in the premenopausal period: Secondary | ICD-10-CM | POA: Diagnosis not present

## 2019-07-22 MED ORDER — NORETHINDRONE ACETATE 5 MG PO TABS: 15.0000 mg | ORAL_TABLET | Freq: Every day | ORAL | 0 refills | Status: DC

## 2019-07-22 NOTE — ED Triage Notes (Signed)
Pt sts large amount of vaginal bleeding since May; pt sts took 10 days of meds without results

## 2019-07-22 NOTE — Discharge Instructions (Signed)
Take 3 pills a day for the first week, then try to reduce to 2 pills a day This should stop the bleeding You need to call your primary care for follow up or refills I recommend follow up with GYN

## 2019-07-22 NOTE — ED Provider Notes (Addendum)
[place]    CSN: [id number] Arrival date & time: 07/22/19  1523      History   Chief Complaint Chief Complaint  Patient presents with   Vaginal Bleeding    HPI Kaitlyn Wise is a 49 y.o. female.   HPI  Very pleasant 49 year old woman who has a long history of dysfunctional and heavy vaginal bleeding Currently has been having a menstrual period since the end of May She has had 2 courses of Provera, but only took 10 mg a day for 10 days and did not get good response She is here because she feels unable to work.  She states that she is bleeding through a pad after pad.  She states "there is blood all over my house". She states that she call but was unable to get an appointment with her primary care doctor. There is no chance that she is pregnant. She does not have a concern for infection. She is open to the suggestion she may need to see a gynecologist in follow-up for more definitive management.  I told her I can stop the bleeding with medicine, however, she needs to be under the care of GYN for definitive ongoing treatment Notes from primary care are reviewed Past Medical History:  Diagnosis Date   Dyslipidemia    Thyroid disease    Uterine fibroid    Vertigo     Patient Active Problem List   Diagnosis Date Noted   Chest pain in adult 08/01/2017   Costochondritis 08/01/2017   Current severe episode of major depressive disorder without psychotic features without prior episode ([place]) 08/01/2017   Mild episode of recurrent major depressive disorder ([place]) 01/18/2017   Migraine 12/05/2015   Gastritis and gastroduodenitis 03/09/2015   Dyslipidemia 06/16/2013   Hypothyroidism 05/21/2013    Past Surgical History:  Procedure Laterality Date   CHOLECYSTECTOMY     CHOLECYSTECTOMY      OB History   No obstetric history on file.      Home Medications    Prior to Admission medications   Medication Sig Start Date End Date Taking? Authorizing  Provider  atorvastatin (LIPITOR) 20 MG tablet Take 1 tablet (20 mg total) by mouth daily. 05/07/19   Charlott Rakes, MD  ferrous sulfate 325 (65 FE) MG EC tablet Take 1 tablet (325 mg total) by mouth 2 (two) times daily as needed. 07/08/19   Charlott Rakes, MD  FLUoxetine (PROZAC) 20 MG capsule Take 1 capsule (20 mg total) by mouth daily. 05/06/19   Charlott Rakes, MD  hydrOXYzine (ATARAX/VISTARIL) 25 MG tablet Take 1 tablet (25 mg total) by mouth at bedtime. 05/06/19   Charlott Rakes, MD  ibuprofen (ADVIL,MOTRIN) 800 MG tablet Take 1 tablet (800 mg total) by mouth 3 (three) times daily. 04/11/17   Pisciotta, Elmyra Ricks, PA-C  levothyroxine (SYNTHROID) 100 MCG tablet Take 1 tablet (100 mcg total) by mouth daily. 07/10/19   Charlott Rakes, MD  medroxyPROGESTERone (PROVERA) 10 MG tablet Take 1 tablet (10 mg total) by mouth daily. 07/17/19   Charlott Rakes, MD  norethindrone (AYGESTIN) 5 MG tablet Take 3 tablets (15 mg total) by mouth daily. 07/22/19   Raylene Everts, MD  pantoprazole (PROTONIX) 40 MG tablet Take 1 tablet (40 mg total) by mouth daily. 07/08/19   Charlott Rakes, MD  dicyclomine (BENTYL) 10 MG capsule Take 1 capsule (10 mg total) by mouth 4 (four) times daily -  before meals and at bedtime. As needed during flare up/pain Patient not taking:  Reported on 08/01/2017 04/05/17 06/03/19  Rutherford Guys, MD  fluticasone [facility]) 50 MCG/ACT nasal spray Place 2 sprays into both nostrils daily. Patient not taking: Reported on 10/22/2017 09/05/17 06/03/19  Street, [place], PA-C  prazosin (MINIPRESS) 1 MG capsule Take 1 capsule (1 mg total) by mouth at bedtime. 10/22/17 06/03/19  Charlott Rakes, MD  topiramate (TOPAMAX) 50 MG tablet Take 1 tablet (50 mg total) by mouth 2 (two) times daily. Patient not taking: Reported on 04/14/2016 11/29/15 06/03/19  Charlott Rakes, MD    Family History Family History  Problem Relation Age of Onset   Diabetes Mother    Hypertension Mother    Diabetes Father     Hypertension Father    Heart disease Brother     Social History Social History   Tobacco Use   Smoking status: Never Smoker   Smokeless tobacco: Never Used  Scientific laboratory technician Use: Never used  Substance Use Topics   Alcohol use: No   Drug use: No     Allergies   Patient has no known allergies.   Review of Systems Review of Systems See HPI  Physical Exam Triage Vital Signs ED Triage Vitals  Enc Vitals Group     BP 07/22/19 1711 121/63     Pulse Rate 07/22/19 1711 82     Resp 07/22/19 1711 18     Temp 07/22/19 1711 98.2 F (36.8 C)     Temp Source 07/22/19 1711 Oral     SpO2 07/22/19 1711 100 %     Weight --      Height --      Head Circumference --      Peak Flow --      Pain Score 07/22/19 1712 5     Pain Loc --      Pain Edu? --      Excl. in [place]? --    No data found.  Updated Vital Signs BP 121/63 (BP Location: Right Arm)    Pulse 82    Temp 98.2 F (36.8 C) (Oral)    Resp 18    SpO2 100%       Physical Exam Constitutional:      General: She is not in acute distress.    Appearance: She is well-developed and normal weight.  HENT:     Head: Normocephalic and atraumatic.     Mouth/Throat:     Comments: mask Eyes:     Conjunctiva/sclera: Conjunctivae normal.     Pupils: Pupils are equal, round, and reactive to light.  Cardiovascular:     Rate and Rhythm: Normal rate.  Pulmonary:     Effort: Pulmonary effort is normal. No respiratory distress.  Abdominal:     General: There is no distension.     Palpations: Abdomen is soft.  Musculoskeletal:        General: Normal range of motion.     Cervical back: Normal range of motion.  Skin:    General: Skin is warm and dry.  Neurological:     Mental Status: She is alert.  Psychiatric:        Mood and Affect: Mood normal.        Behavior: Behavior normal.    Seen with Arabic interpreter Abdomen is nontender UC Treatments / Results  Labs (all labs ordered are listed, but only abnormal  results are displayed) Labs Reviewed - No data to display  EKG   Radiology No results found.  Procedures Procedures (including  critical care time)  Medications Ordered in UC Medications - No data to display  Initial Impression / Assessment and Plan / UC Course  I have reviewed the triage vital signs and the nursing notes.  Pertinent labs & imaging results that were available during my care of the patient were reviewed by me and considered in my medical decision making (see chart for details).      Final Clinical Impressions(s) / UC Diagnoses   Final diagnoses:  Premenopause menorrhagia     Discharge Instructions     Take 3 pills a day for the first week, then try to reduce to 2 pills a day This should stop the bleeding You need to call your primary care for follow up or refills I recommend follow up with GYN   ED Prescriptions    Medication Sig Dispense Auth. Provider   norethindrone (AYGESTIN) 5 MG tablet Take 3 tablets (15 mg total) by mouth daily. 90 tablet Raylene Everts, MD     PDMP not reviewed this encounter.   Raylene Everts, MD 07/22/19 4536    Raylene Everts, MD 07/22/19 2049

## 2019-07-23 MED FILL — NORETHINDRONE 5 MG TABLET: 5 | 30 days supply | Qty: 90 | Fill #0

## 2019-08-06 MED FILL — LEVOTHYROXINE SODIUM 100 MC: 100 | 30 days supply | Qty: 30 | Fill #3

## 2019-09-03 ENCOUNTER — Telehealth: Payer: Self-pay

## 2019-09-03 DIAGNOSIS — N939 Abnormal uterine and vaginal bleeding, unspecified: Secondary | ICD-10-CM

## 2019-09-03 NOTE — Telephone Encounter (Signed)
No more refills PROVERA. Pt states continued vaginal bleeding. Scheduled appt [facility] 09/23/19.

## 2019-09-03 NOTE — Telephone Encounter (Signed)
Will forward to PCP 

## 2019-09-04 MED ORDER — MEDROXYPROGESTERONE ACETATE 10 MG PO TABS: 10.0000 mg | ORAL_TABLET | Freq: Every day | ORAL | 0 refills | Status: DC

## 2019-09-04 MED FILL — MEDROXYPROGESTERONE 10 MG T: 10 | 10 days supply | Qty: 10 | Fill #0

## 2019-09-04 NOTE — Telephone Encounter (Signed)
She needs an appointment for Pap smear.  I had recommended a 2-week follow-up for Pap smear at her last visit.  I have refilled her medication.

## 2019-09-08 ENCOUNTER — Encounter (HOSPITAL_COMMUNITY): Payer: Self-pay | Admitting: Emergency Medicine

## 2019-09-08 ENCOUNTER — Other Ambulatory Visit: Payer: Self-pay

## 2019-09-08 ENCOUNTER — Ambulatory Visit (HOSPITAL_COMMUNITY)
Admission: EM | Admit: 2019-09-08 | Discharge: 2019-09-08 | Disposition: A | Discharge status: Home / Self Care | Payer: 59 | Attending: Emergency Medicine | Admitting: Emergency Medicine

## 2019-09-08 DIAGNOSIS — N924 Excessive bleeding in the premenopausal period: Secondary | ICD-10-CM | POA: Diagnosis not present

## 2019-09-08 DIAGNOSIS — M5441 Lumbago with sciatica, right side: Secondary | ICD-10-CM

## 2019-09-08 MED ORDER — CYCLOBENZAPRINE HCL 5 MG PO TABS: 5.0000 mg | ORAL_TABLET | Freq: Two times a day (BID) | ORAL | 0 refills | Status: DC | PRN

## 2019-09-08 MED ORDER — NAPROXEN 500 MG PO TABS: 500.0000 mg | ORAL_TABLET | Freq: Two times a day (BID) | ORAL | 0 refills | Status: DC

## 2019-09-08 MED ORDER — NORETHINDRONE ACETATE 5 MG PO TABS: 15.0000 mg | ORAL_TABLET | Freq: Every day | ORAL | 0 refills | Status: DC

## 2019-09-08 NOTE — ED Triage Notes (Signed)
Using Arabic interpreter: Pt is complaining of heavy vaginal bleeding x 3 months. She states that she is concerned that her right leg is paining her and she has trouble standing or bearing weight for long periods of time. She states sometimes she takes the medication to stop her periods 3 times a day when she starts bleeding. She is concerned that she may have a blood clot.

## 2019-09-08 NOTE — Discharge Instructions (Signed)
I have refilled norethindrone to use for heavy bleeding, follow-up with primary care/OB/GYN for further evaluation and treatment of this  Naprosyn twice daily with food for back and leg pain You may use flexeril as needed to help with pain. This is a muscle relaxer and causes sedation- please use only at bedtime or when you will be home and not have to drive/work Gentle stretching, avoid bedrest Please follow-up if developing any increased pain swelling redness to right leg/calf

## 2019-09-08 NOTE — ED Provider Notes (Signed)
[place]    CSN: [id number] Arrival date & time: 09/08/19  1212      History   Chief Complaint Chief Complaint  Patient presents with  . Leg Pain    HPI Kaitlyn Wise is a 49 y.o. female presenting today for evaluation of vaginal bleeding and leg pain.   Vaginal bleeding for 3 months. Waxes and wanes. Taking norethindrone 15 mg daily. Helped a lot ease off bleeds. Occasionally dizzy, no longer with dizziness since starting iron pills. Appointment scheduled for 09/08 for follow up on bleeding.  Leg pain for 4 days. Hard time getting up or sitting down. Walking helps, but rest worsens pain. Noticed some swelling around toes. Unsure of swelling in leg. Pain only on right side. Pain radiates to back/buttock pain. Ibuprofen helped. Most intense posteriorly in lower leg and popliteal area.Expresses concern for blood clot- Denies prior DVT/PE, tobacco use, travel, surgery, immobilization, deneis estrogen use. Works at [employer], work in Archivist. Denies chest pain and SOB.   HPI  Past Medical History:  Diagnosis Date  . Dyslipidemia   . Thyroid disease   . Uterine fibroid   . Vertigo     Patient Active Problem List   Diagnosis Date Noted  . Chest pain in adult 08/01/2017  . Costochondritis 08/01/2017  . Current severe episode of major depressive disorder without psychotic features without prior episode ([place]) 08/01/2017  . Mild episode of recurrent major depressive disorder ([place]) 01/18/2017  . Migraine 12/05/2015  . Gastritis and gastroduodenitis 03/09/2015  . Dyslipidemia 06/16/2013  . Hypothyroidism 05/21/2013    Past Surgical History:  Procedure Laterality Date  . CHOLECYSTECTOMY    . CHOLECYSTECTOMY      OB History   No obstetric history on file.      Home Medications    Prior to Admission medications   Medication Sig Start Date End Date Taking? Authorizing Provider  atorvastatin (LIPITOR) 20 MG tablet Take 1 tablet (20 mg total)  by mouth daily. 05/07/19  Yes Charlott Rakes, MD  ferrous sulfate 325 (65 FE) MG EC tablet Take 1 tablet (325 mg total) by mouth 2 (two) times daily as needed. 07/08/19  Yes Charlott Rakes, MD  FLUoxetine (PROZAC) 20 MG capsule Take 1 capsule (20 mg total) by mouth daily. 05/06/19  Yes Charlott Rakes, MD  hydrOXYzine (ATARAX/VISTARIL) 25 MG tablet Take 1 tablet (25 mg total) by mouth at bedtime. 05/06/19  Yes Charlott Rakes, MD  ibuprofen (ADVIL,MOTRIN) 800 MG tablet Take 1 tablet (800 mg total) by mouth 3 (three) times daily. 04/11/17  Yes Pisciotta, Elmyra Ricks, PA-C  levothyroxine (SYNTHROID) 100 MCG tablet Take 1 tablet (100 mcg total) by mouth daily. 07/10/19  Yes Charlott Rakes, MD  pantoprazole (PROTONIX) 40 MG tablet Take 1 tablet (40 mg total) by mouth daily. 07/08/19  Yes Charlott Rakes, MD  medroxyPROGESTERone (PROVERA) 10 MG tablet Take 1 tablet (10 mg total) by mouth daily. 09/04/19 09/08/19 Yes Charlott Rakes, MD  cyclobenzaprine (FLEXERIL) 5 MG tablet Take 1-2 tablets (5-10 mg total) by mouth 2 (two) times daily as needed for muscle spasms. 09/08/19   Adylee Leonardo C, PA-C  naproxen (NAPROSYN) 500 MG tablet Take 1 tablet (500 mg total) by mouth 2 (two) times daily. 09/08/19   Amyah Clawson C, PA-C  norethindrone (AYGESTIN) 5 MG tablet Take 3 tablets (15 mg total) by mouth daily. 2 tablets daily after bleeding stops x 1 week, then 1 tablet daily x 1 week 09/08/19   Joneen Caraway,  June Rode C, PA-C  dicyclomine (BENTYL) 10 MG capsule Take 1 capsule (10 mg total) by mouth 4 (four) times daily -  before meals and at bedtime. As needed during flare up/pain Patient not taking: Reported on 08/01/2017 04/05/17 06/03/19  Rutherford Guys, MD  fluticasone [facility]) 50 MCG/ACT nasal spray Place 2 sprays into both nostrils daily. Patient not taking: Reported on 10/22/2017 09/05/17 06/03/19  Street, [place], PA-C  prazosin (MINIPRESS) 1 MG capsule Take 1 capsule (1 mg total) by mouth at bedtime. 10/22/17 06/03/19   Charlott Rakes, MD  topiramate (TOPAMAX) 50 MG tablet Take 1 tablet (50 mg total) by mouth 2 (two) times daily. Patient not taking: Reported on 04/14/2016 11/29/15 06/03/19  Charlott Rakes, MD    Family History Family History  Problem Relation Age of Onset  . Diabetes Mother   . Hypertension Mother   . Diabetes Father   . Hypertension Father   . Heart disease Brother     Social History Social History   Tobacco Use  . Smoking status: Never Smoker  . Smokeless tobacco: Never Used  Vaping Use  . Vaping Use: Never used  Substance Use Topics  . Alcohol use: No  . Drug use: No     Allergies   Patient has no known allergies.   Review of Systems Review of Systems  Constitutional: Negative for fever.  Respiratory: Negative for shortness of breath.   Cardiovascular: Negative for chest pain.  Gastrointestinal: Negative for abdominal pain, diarrhea, nausea and vomiting.  Genitourinary: Positive for vaginal bleeding. Negative for dysuria, flank pain, genital sores, hematuria, menstrual problem, vaginal discharge and vaginal pain.  Musculoskeletal: Positive for gait problem and myalgias. Negative for back pain.  Skin: Negative for rash.  Neurological: Negative for dizziness, light-headedness and headaches.     Physical Exam Triage Vital Signs ED Triage Vitals  Enc Vitals Group     BP 09/08/19 1405 124/69     Pulse Rate 09/08/19 1405 69     Resp 09/08/19 1405 18     Temp 09/08/19 1405 98.7 F (37.1 C)     Temp Source 09/08/19 1405 Oral     SpO2 09/08/19 1405 100 %     Weight --      Height --      Head Circumference --      Peak Flow --      Pain Score 09/08/19 1410 7     Pain Loc --      Pain Edu? --      Excl. in [place]? --    No data found.  Updated Vital Signs BP 124/69 (BP Location: Left Arm)   Pulse 69   Temp 98.7 F (37.1 C) (Oral)   Resp 18   SpO2 100%   Visual Acuity Right Eye Distance:   Left Eye Distance:   Bilateral Distance:    Right Eye  Near:   Left Eye Near:    Bilateral Near:     Physical Exam Vitals and nursing note reviewed.  Constitutional:      Appearance: She is well-developed.     Comments: No acute distress  HENT:     Head: Normocephalic and atraumatic.     Nose: Nose normal.  Eyes:     Conjunctiva/sclera: Conjunctivae normal.  Cardiovascular:     Rate and Rhythm: Normal rate.  Pulmonary:     Effort: Pulmonary effort is normal. No respiratory distress.  Abdominal:     General: There is no distension.  Musculoskeletal:        General: Normal range of motion.     Cervical back: Neck supple.     Comments: Tender to palpation diffusely throughout lumbar spine midline extending throughout right lateral lumbar musculature gluteal area into proximal thigh, tenderness to popliteal area  Right lower leg: Right calf without swelling tenderness or erythema, no palpable cord; dorsalis pedis 2+  Skin:    General: Skin is warm and dry.  Neurological:     Mental Status: She is alert and oriented to person, place, and time.      UC Treatments / Results  Labs (all labs ordered are listed, but only abnormal results are displayed) Labs Reviewed - No data to display  EKG   Radiology No results found.  Procedures Procedures (including critical care time)  Medications Ordered in UC Medications - No data to display  Initial Impression / Assessment and Plan / UC Course  I have reviewed the triage vital signs and the nursing notes.  Pertinent labs & imaging results that were available during my care of the patient were reviewed by me and considered in my medical decision making (see chart for details).     1.  Vaginal bleeding-we will refill norethindrone given this has been helpful in easing off heaviness of bleeding.  Follow-up with PCP/OB/GYN for further evaluation of this.  Continue iron pills.  2.  Back pain/leg pain-low suspicion of DVT, PERC negative, no clinical signs of DVT.  Suspect most likely  low back pain with radicular distribution.  Recommending continue anti-inflammatories muscle relaxers.  Naprosyn and Flexeril.  Activity modification, gentle stretching.  Discussed strict return precautions. Patient verbalized understanding and is agreeable with plan.   Final Clinical Impressions(s) / UC Diagnoses   Final diagnoses:  Acute right-sided low back pain with right-sided sciatica  Excessive bleeding in premenopausal period     Discharge Instructions     I have refilled norethindrone to use for heavy bleeding, follow-up with primary care/OB/GYN for further evaluation and treatment of this  Naprosyn twice daily with food for back and leg pain You may use flexeril as needed to help with pain. This is a muscle relaxer and causes sedation- please use only at bedtime or when you will be home and not have to drive/work Gentle stretching, avoid bedrest Please follow-up if developing any increased pain swelling redness to right leg/calf    ED Prescriptions    Medication Sig Dispense Auth. Provider   naproxen (NAPROSYN) 500 MG tablet Take 1 tablet (500 mg total) by mouth 2 (two) times daily. 30 tablet Camerin Jimenez C, PA-C   cyclobenzaprine (FLEXERIL) 5 MG tablet Take 1-2 tablets (5-10 mg total) by mouth 2 (two) times daily as needed for muscle spasms. 24 tablet Clearence Vitug C, PA-C   norethindrone (AYGESTIN) 5 MG tablet Take 3 tablets (15 mg total) by mouth daily. 2 tablets daily after bleeding stops x 1 week, then 1 tablet daily x 1 week 90 tablet Victorious Kundinger C, PA-C     PDMP not reviewed this encounter.   Janith Lima, [place] 09/08/19 [phone]

## 2019-09-09 MED FILL — NAPROXEN 500 MG TABLET: 500 | 15 days supply | Qty: 30 | Fill #0

## 2019-09-09 MED FILL — NORETHINDRONE 5 MG TABLET: 5 | 30 days supply | Qty: 90 | Fill #0

## 2019-09-09 MED FILL — CYCLOBENZAPRINE 5 MG TABLET: 5 | 6 days supply | Qty: 24 | Fill #0

## 2019-09-23 ENCOUNTER — Ambulatory Visit: Payer: 59 | Attending: Physician Assistant | Admitting: Physician Assistant

## 2019-09-23 ENCOUNTER — Other Ambulatory Visit: Payer: Self-pay

## 2019-09-23 DIAGNOSIS — E785 Hyperlipidemia, unspecified: Secondary | ICD-10-CM | POA: Diagnosis not present

## 2019-09-23 DIAGNOSIS — E038 Other specified hypothyroidism: Secondary | ICD-10-CM | POA: Diagnosis not present

## 2019-09-23 DIAGNOSIS — N939 Abnormal uterine and vaginal bleeding, unspecified: Secondary | ICD-10-CM

## 2019-09-23 DIAGNOSIS — D649 Anemia, unspecified: Secondary | ICD-10-CM | POA: Diagnosis not present

## 2019-09-23 IMAGING — DX DG CHEST 2V
2 series · 2 of 2 positions shown · non-contrast
Comparison: 11/09/2015.

CLINICAL DATA: Chest pain.

EXAM:
CHEST - 2 VIEW

[chest pa]
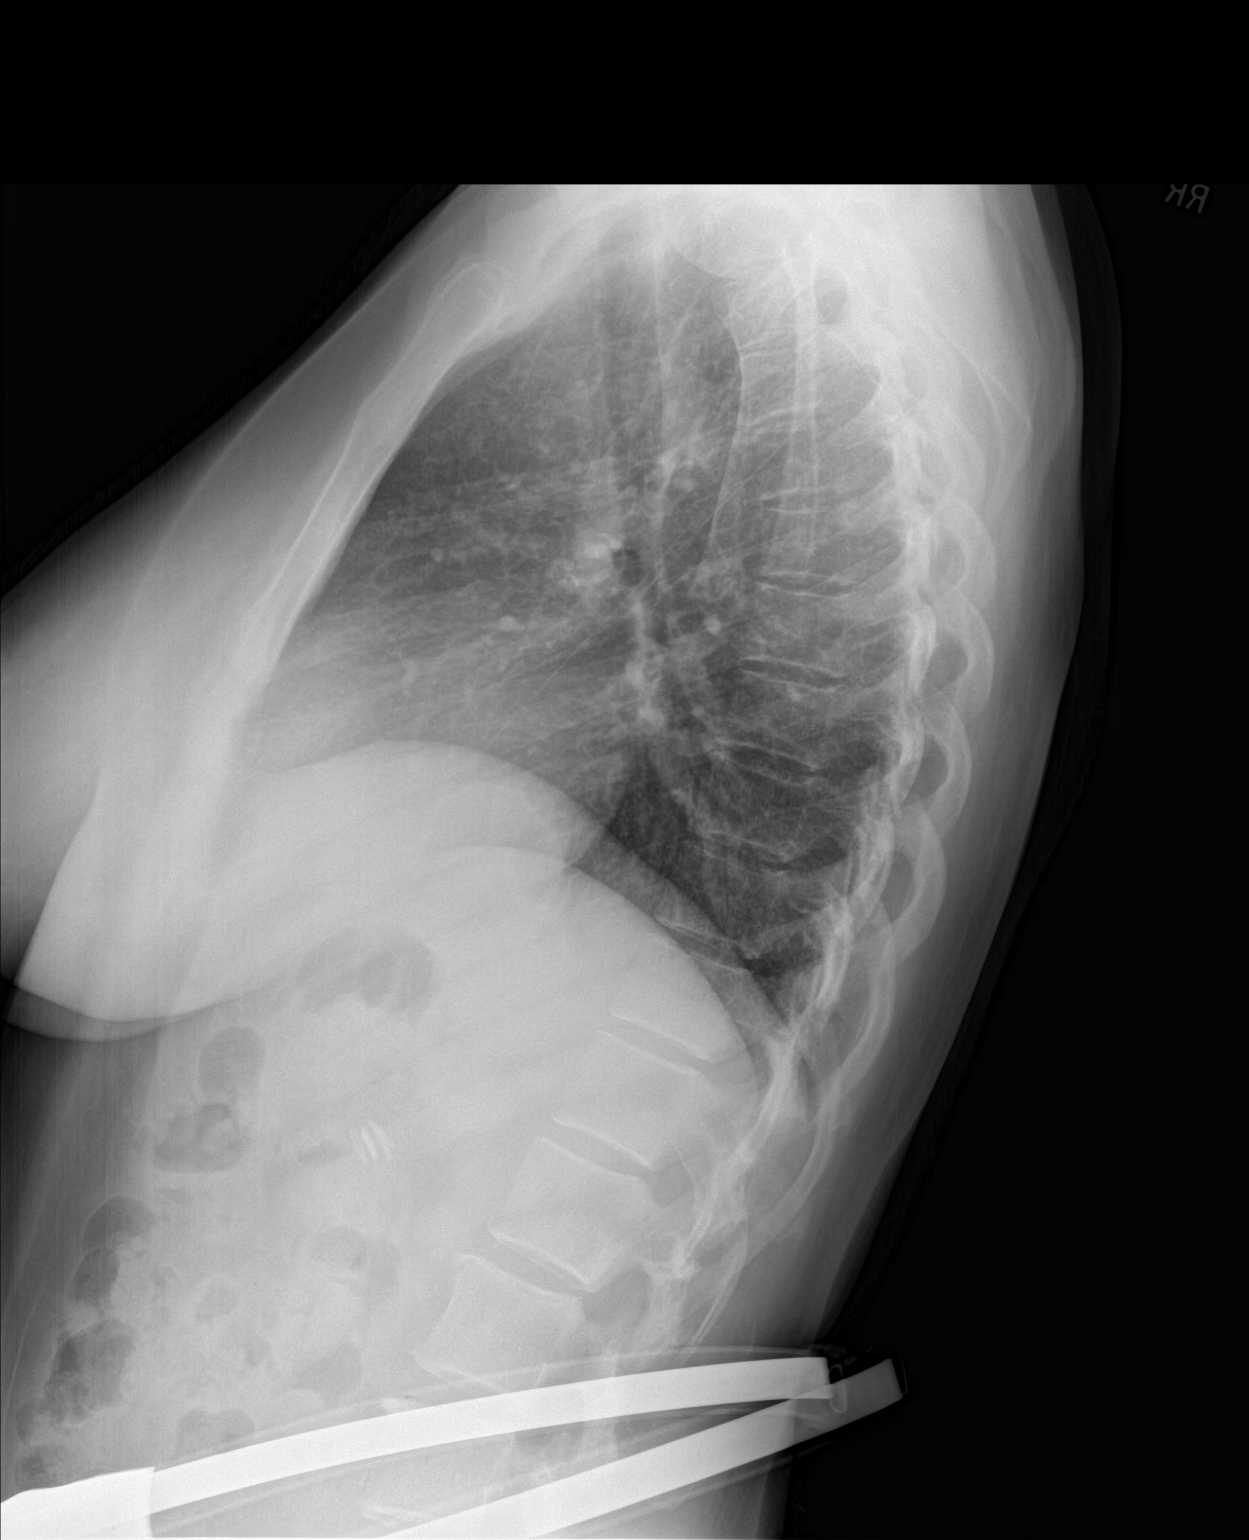

[chest lat]
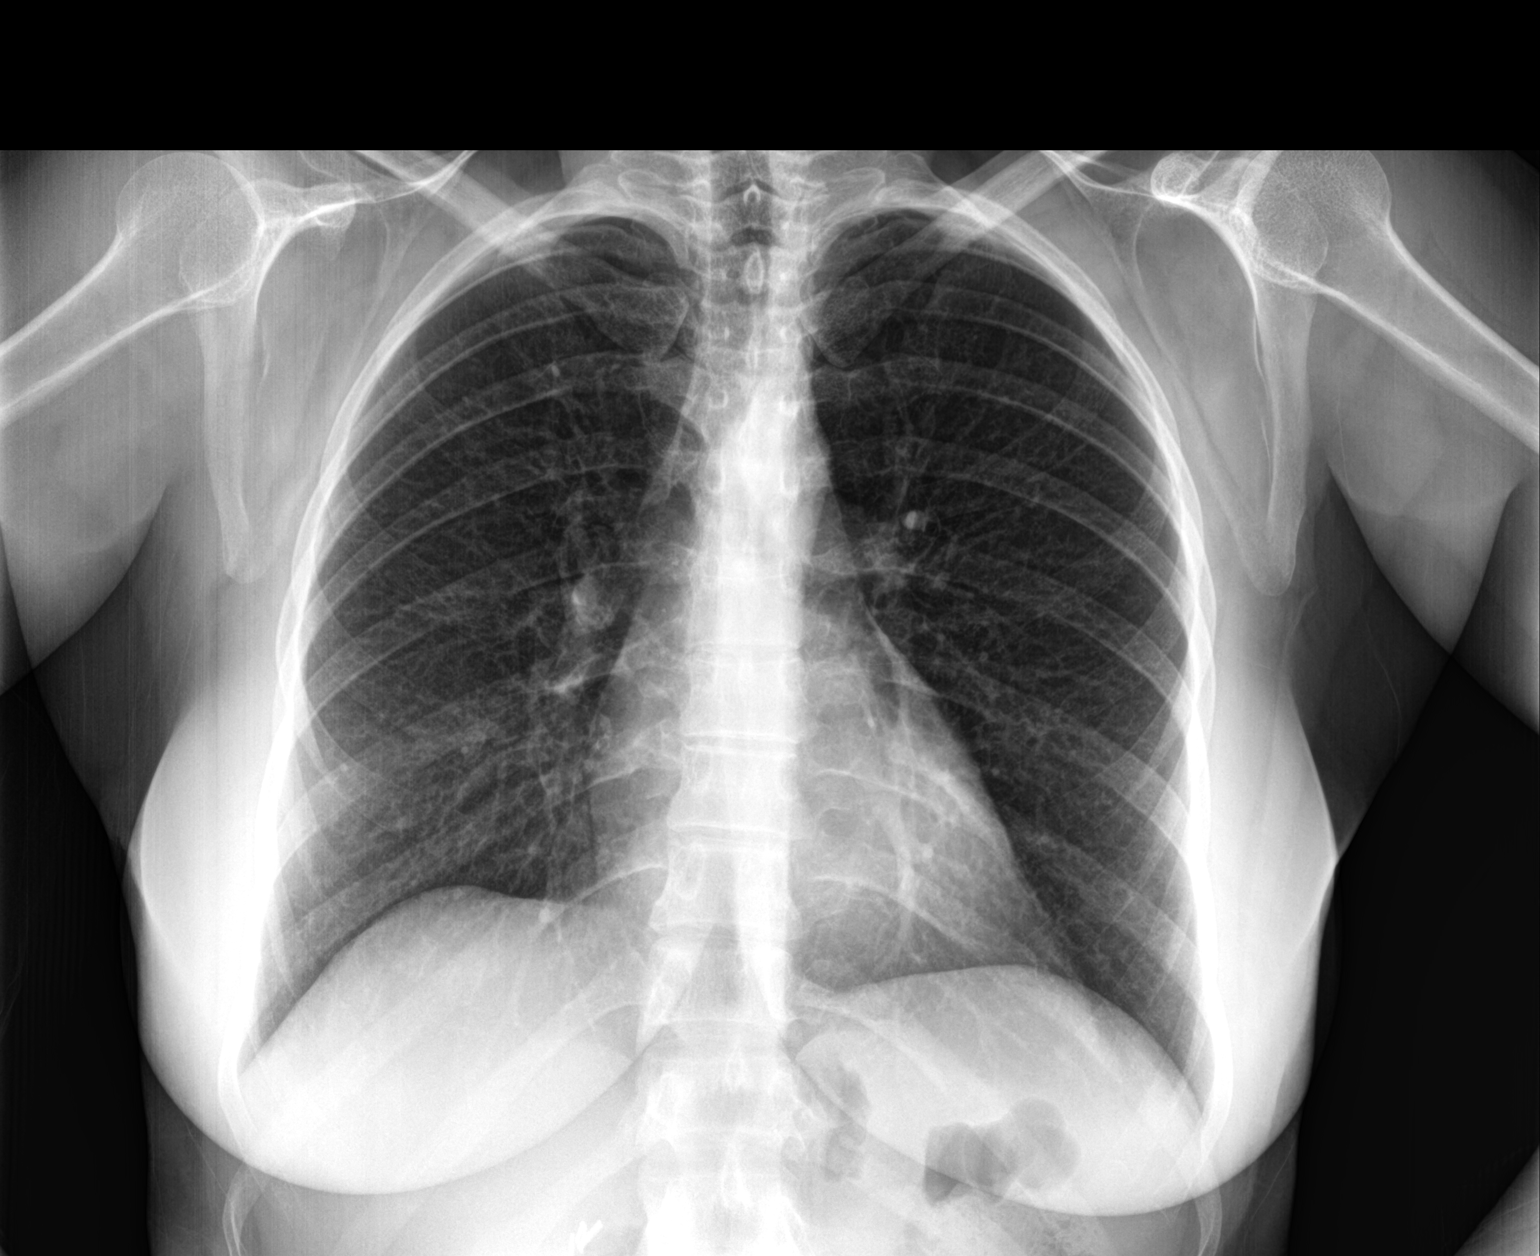

[2 of 2 positions shown; findings below may reference images not displayed]

FINDINGS: Mediastinum and hilar structures are normal. Heart size normal. No
focal infiltrate. No pleural effusion or pneumothorax. No acute bony
abnormality. Surgical clips right upper quadrant.
IMPRESSION: No acute cardiopulmonary disease.

## 2019-09-23 NOTE — Progress Notes (Signed)
Pt is requesting blood work

## 2019-09-23 NOTE — Progress Notes (Signed)
Virtual Visit via Telephone Note  I connected with Kaitlyn Wise on 09/23/19 at 10:30 AM EDT by telephone and verified that I am speaking with the correct person using two identifiers.   I discussed the limitations, risks, security and privacy concerns of performing an evaluation and management service by telephone and the availability of in person appointments. I also discussed with the patient that there may be a patient responsible charge related to this service. The patient expressed understanding and agreed to proceed.  PATIENT visit by telephone virtually in the context of Covid-19 pandemic. Patient location:  home My Location:  [facility] office Persons on the call:  Me and the patient and [facility] interpreter.     History of Present Illness:  Patient needs repeat blood work after starting cholesterol meds and thyroid medications.  Also needs gyn referral for heavy bleeding.      Observations/Objective:  NAD.  A&Ox3   Assessment and Plan: 1. Other specified hypothyroidism On synthroid - Thyroid Panel With TSH  2. Dyslipidemia - Lipid panel - Ambulatory referral to Gynecology  3. Abnormal uterine bleeding - CBC with Differential/Platelet - Iron, TIBC and Ferritin Panel - Ambulatory referral to Gynecology  4. Anemia, unspecified type - CBC with Differential/Platelet - Iron, TIBC and Ferritin Panel - Ambulatory referral to Gynecology    Follow Up Instructions: See PCP in 2-3 months   I discussed the assessment and treatment plan with the patient. The patient was provided an opportunity to ask questions and all were answered. The patient agreed with the plan and demonstrated an understanding of the instructions.   The patient was advised to call back or seek an in-person evaluation if the symptoms worsen or if the condition fails to improve as anticipated.  I provided 12 minutes of non-face-to-face time during this encounter.   Freeman Caldron, PA-C  Patient ID: Kaitlyn Wise, female   DOB: [birth year], 49 y.o.   MRN: [id number]

## 2019-09-24 ENCOUNTER — Other Ambulatory Visit: Payer: Self-pay | Admitting: Physician Assistant

## 2019-09-24 DIAGNOSIS — E038 Other specified hypothyroidism: Secondary | ICD-10-CM

## 2019-09-24 LAB — CBC WITH DIFFERENTIAL/PLATELET
Basophils Absolute: 0.1 10*3/uL (ref 0.0–0.2)
Basos: 2 %
EOS (ABSOLUTE): 0.4 10*3/uL (ref 0.0–0.4)
Eos: 8 %
Hematocrit: 36.9 % (ref 34.0–46.6)
Hemoglobin: 11.3 g/dL (ref 11.1–15.9)
Immature Grans (Abs): 0 10*3/uL (ref 0.0–0.1)
Immature Granulocytes: 0 %
Lymphocytes Absolute: 1.7 10*3/uL (ref 0.7–3.1)
Lymphs: 33 %
MCH: 26.2 pg — ABNORMAL LOW (ref 26.6–33.0)
MCHC: 30.6 g/dL — ABNORMAL LOW (ref 31.5–35.7)
MCV: 86 fL (ref 79–97)
Monocytes Absolute: 0.3 10*3/uL (ref 0.1–0.9)
Monocytes: 7 %
Neutrophils Absolute: 2.6 10*3/uL (ref 1.4–7.0)
Neutrophils: 50 %
Platelets: 284 10*3/uL (ref 150–450)
RBC: 4.31 x10E6/uL (ref 3.77–5.28)
RDW: 17 % — ABNORMAL HIGH (ref 11.7–15.4)
WBC: 5.2 10*3/uL (ref 3.4–10.8)

## 2019-09-24 LAB — THYROID PANEL WITH TSH
Free Thyroxine Index: 0.6 — ABNORMAL LOW (ref 1.2–4.9)
T3 Uptake Ratio: 26 % (ref 24–39)
T4, Total: 2.4 ug/dL — ABNORMAL LOW (ref 4.5–12.0)
TSH: 62.7 u[IU]/mL — ABNORMAL HIGH (ref 0.450–4.500)

## 2019-09-24 LAB — IRON,TIBC AND FERRITIN PANEL
Ferritin: 20 ng/mL (ref 15–150)
Iron Saturation: 5 % — CL (ref 15–55)
Iron: 18 ug/dL — ABNORMAL LOW (ref 27–159)
Total Iron Binding Capacity: 390 ug/dL (ref 250–450)
UIBC: 372 ug/dL (ref 131–425)

## 2019-09-24 LAB — LIPID PANEL
Chol/HDL Ratio: 5.3 ratio — ABNORMAL HIGH (ref 0.0–4.4)
Cholesterol, Total: 187 mg/dL (ref 100–199)
HDL: 35 mg/dL — ABNORMAL LOW (ref >39)
LDL Chol Calc (NIH): 142 mg/dL — ABNORMAL HIGH (ref 0–99)
Triglycerides: 53 mg/dL (ref 0–149)
VLDL Cholesterol Cal: 10 mg/dL (ref 5–40)

## 2019-09-24 MED ORDER — LEVOTHYROXINE SODIUM 100 MCG PO TABS: 150.0000 ug | ORAL_TABLET | Freq: Every day | ORAL | 3 refills | Status: DC

## 2019-09-24 MED FILL — LEVOTHYROXINE SODIUM 100 MC: 100 | 30 days supply | Qty: 45 | Fill #0

## 2019-10-01 MED FILL — PANTOPRAZOLE SOD DR 40 MG T: 40 | 30 days supply | Qty: 30 | Fill #1

## 2019-10-15 ENCOUNTER — Other Ambulatory Visit: Payer: Self-pay

## 2019-10-15 ENCOUNTER — Other Ambulatory Visit (HOSPITAL_COMMUNITY): Payer: Self-pay | Admitting: Family Medicine

## 2019-10-15 ENCOUNTER — Ambulatory Visit (HOSPITAL_COMMUNITY)
Admission: EM | Admit: 2019-10-15 | Discharge: 2019-10-15 | Disposition: A | Discharge status: Home / Self Care | Payer: 59 | Attending: Family Medicine | Admitting: Family Medicine

## 2019-10-15 ENCOUNTER — Encounter (HOSPITAL_COMMUNITY): Payer: Self-pay | Admitting: *Deleted

## 2019-10-15 DIAGNOSIS — N939 Abnormal uterine and vaginal bleeding, unspecified: Secondary | ICD-10-CM

## 2019-10-15 DIAGNOSIS — R1030 Lower abdominal pain, unspecified: Secondary | ICD-10-CM | POA: Insufficient documentation

## 2019-10-15 DIAGNOSIS — Z3202 Encounter for pregnancy test, result negative: Secondary | ICD-10-CM

## 2019-10-15 LAB — CBC WITH DIFFERENTIAL/PLATELET
Abs Immature Granulocytes: 0.01 10*3/uL (ref 0.00–0.07)
Basophils Absolute: 0.1 10*3/uL (ref 0.0–0.1)
Basophils Relative: 2 %
Eosinophils Absolute: 0.3 10*3/uL (ref 0.0–0.5)
Eosinophils Relative: 6 %
HCT: 36.2 % (ref 36.0–46.0)
Hemoglobin: 10.8 g/dL — ABNORMAL LOW (ref 12.0–15.0)
Immature Granulocytes: 0 %
Lymphocytes Relative: 33 %
Lymphs Abs: 1.8 10*3/uL (ref 0.7–4.0)
MCH: 25.7 pg — ABNORMAL LOW (ref 26.0–34.0)
MCHC: 29.8 g/dL — ABNORMAL LOW (ref 30.0–36.0)
MCV: 86.2 fL (ref 80.0–100.0)
Monocytes Absolute: 0.4 10*3/uL (ref 0.1–1.0)
Monocytes Relative: 8 %
Neutro Abs: 2.7 10*3/uL (ref 1.7–7.7)
Neutrophils Relative %: 51 %
Platelets: 292 10*3/uL (ref 150–400)
RBC: 4.2 MIL/uL (ref 3.87–5.11)
RDW: 17 % — ABNORMAL HIGH (ref 11.5–15.5)
WBC: 5.3 10*3/uL (ref 4.0–10.5)
nRBC: 0 % (ref 0.0–0.2)

## 2019-10-15 LAB — COMPREHENSIVE METABOLIC PANEL
ALT: 25 U/L (ref 0–44)
AST: 21 U/L (ref 15–41)
Albumin: 3.3 g/dL — ABNORMAL LOW (ref 3.5–5.0)
Alkaline Phosphatase: 69 U/L (ref 38–126)
Anion gap: 9 (ref 5–15)
BUN: 15 mg/dL (ref 6–20)
CO2: 24 mmol/L (ref 22–32)
Calcium: 8.9 mg/dL (ref 8.9–10.3)
Chloride: 103 mmol/L (ref 98–111)
Creatinine, Ser: 0.74 mg/dL (ref 0.44–1.00)
GFR calc Af Amer: >60 mL/min (ref >60)
GFR calc non Af Amer: >60 mL/min (ref >60)
Glucose, Bld: 89 mg/dL (ref 70–99)
Potassium: 5 mmol/L (ref 3.5–5.1)
Sodium: 136 mmol/L (ref 135–145)
Total Bilirubin: 0.5 mg/dL (ref 0.3–1.2)
Total Protein: 7 g/dL (ref 6.5–8.1)

## 2019-10-15 LAB — POCT URINALYSIS DIPSTICK, ED / UC
Bilirubin Urine: NEGATIVE
Glucose, UA: NEGATIVE mg/dL
Ketones, ur: NEGATIVE mg/dL
Leukocytes,Ua: NEGATIVE
Nitrite: NEGATIVE
Protein, ur: NEGATIVE mg/dL
Specific Gravity, Urine: 1.025 (ref 1.005–1.030)
Urobilinogen, UA: 0.2 mg/dL (ref 0.0–1.0)
pH: 7 (ref 5.0–8.0)

## 2019-10-15 LAB — POC URINE PREG, ED: Preg Test, Ur: NEGATIVE

## 2019-10-15 MED ORDER — NORETHINDRONE ACETATE 5 MG PO TABS
15.0000 mg | ORAL_TABLET | Freq: Every day | ORAL | 1 refills | Status: DC
Start: 2019-10-15 — End: 2019-12-09

## 2019-10-15 MED FILL — NORETHINDRONE 5 MG TABLET: 5 | 90 days supply | Qty: 90 | Fill #0

## 2019-10-15 NOTE — ED Provider Notes (Signed)
[place]    CSN: [id number] Arrival date & time: 10/15/19  0945      History   Chief Complaint Chief Complaint  Patient presents with  . Abdominal Pain  . Vaginal Bleeding    HPI Kaitlyn Wise is a 49 y.o. female.   Medical translator was used today for arabic translation with patient consent.   She states she's been dealing with heavy vaginal bleeding for about 5 months now for which she's seen been seen by UC and PCP. Norethindrone has been helping slow and sometimes stop the bleeding but she is running low on this and needs refill. Has upcoming appt with GYN but earliest available appt was 5 weeks out. Per record review has significant hypothyroidism with recent dose adjustment through PCP and previous u/s revealed a uterine fibroid, otherwise no known GU issues.   She also started yesterday with significant lower abdominal pain lasting about 30 minutes, worst in the center. The pain returned this morning for about the same length of time but again has resolved. Denies N/V/D, constipation, headache, fever, chills, concern for STIs or pregnancy. Has not taken anything for her abdominal pain.     Past Medical History:  Diagnosis Date  . Dyslipidemia   . Thyroid disease   . Uterine fibroid   . Vertigo     Patient Active Problem List   Diagnosis Date Noted  . Chest pain in adult 08/01/2017  . Costochondritis 08/01/2017  . Current severe episode of major depressive disorder without psychotic features without prior episode ([place]) 08/01/2017  . Mild episode of recurrent major depressive disorder ([place]) 01/18/2017  . Migraine 12/05/2015  . Gastritis and gastroduodenitis 03/09/2015  . Dyslipidemia 06/16/2013  . Hypothyroidism 05/21/2013    Past Surgical History:  Procedure Laterality Date  . CHOLECYSTECTOMY    . CHOLECYSTECTOMY      OB History   No obstetric history on file.      Home Medications    Prior to Admission medications   Medication Sig  Start Date End Date Taking? Authorizing Provider  atorvastatin (LIPITOR) 20 MG tablet Take 1 tablet (20 mg total) by mouth daily. 05/07/19  Yes Charlott Rakes, MD  dicyclomine (BENTYL) 10 MG capsule Take by mouth. 04/14/19  Yes [provider]  ferrous sulfate 325 (65 FE) MG EC tablet Take 1 tablet (325 mg total) by mouth 2 (two) times daily as needed. 07/08/19  Yes Charlott Rakes, MD  levothyroxine (SYNTHROID) 100 MCG tablet Take 100 mcg by mouth. 10/22/17  Yes [provider]  cyclobenzaprine (FLEXERIL) 5 MG tablet Take 1-2 tablets (5-10 mg total) by mouth 2 (two) times daily as needed for muscle spasms. 09/08/19   Wieters, Hallie C, PA-C  FLUoxetine (PROZAC) 20 MG capsule Take 1 capsule (20 mg total) by mouth daily. 05/06/19   Charlott Rakes, MD  fluticasone (FLONASE) 50 MCG/ACT nasal spray Place into the nose. 04/14/19   [provider]  hydrOXYzine (ATARAX/VISTARIL) 25 MG tablet Take 1 tablet (25 mg total) by mouth at bedtime. 05/06/19   Charlott Rakes, MD  ibuprofen (ADVIL,MOTRIN) 800 MG tablet Take 1 tablet (800 mg total) by mouth 3 (three) times daily. 04/11/17   Pisciotta, Elmyra Ricks, PA-C  levothyroxine (SYNTHROID) 100 MCG tablet Take 1.5 tablets (150 mcg total) by mouth daily. 09/24/19   Argentina Donovan, PA-C  naproxen (NAPROSYN) 500 MG tablet Take 1 tablet (500 mg total) by mouth 2 (two) times daily. 09/08/19   Wieters, Hallie C, PA-C  norethindrone (  AYGESTIN) 5 MG tablet Take 3 tablets (15 mg total) by mouth daily. 2 tablets daily after bleeding stops x 1 week, then 1 tablet daily x 1 week 10/15/19   Volney American, PA-C  pantoprazole (PROTONIX) 40 MG tablet Take 1 tablet (40 mg total) by mouth daily. 07/08/19   Charlott Rakes, MD  medroxyPROGESTERone (PROVERA) 10 MG tablet Take 1 tablet (10 mg total) by mouth daily. 09/04/19 09/08/19  Charlott Rakes, MD  prazosin (MINIPRESS) 1 MG capsule Take 1 capsule (1 mg total) by mouth at bedtime. 10/22/17 06/03/19  Charlott Rakes, MD  topiramate (TOPAMAX) 50 MG tablet Take 1 tablet (50 mg total) by mouth 2 (two) times daily. Patient not taking: Reported on 04/14/2016 11/29/15 06/03/19  Charlott Rakes, MD    Family History Family History  Problem Relation Age of Onset  . Diabetes Mother   . Hypertension Mother   . Diabetes Father   . Hypertension Father   . Heart disease Brother     Social History Social History   Tobacco Use  . Smoking status: Never Smoker  . Smokeless tobacco: Never Used  Vaping Use  . Vaping Use: Never used  Substance Use Topics  . Alcohol use: No  . Drug use: No     Allergies   Patient has no known allergies.   Review of Systems Review of Systems PER HPI    Physical Exam Triage Vital Signs ED Triage Vitals  Enc Vitals Group     BP 10/15/19 1120 (!) 110/59     Pulse Rate 10/15/19 1120 72     Resp 10/15/19 1120 20     Temp 10/15/19 1119 98 F (36.7 C)     Temp Source 10/15/19 1119 Temporal     SpO2 10/15/19 1120 100 %     Weight --      Height --      Head Circumference --      Peak Flow --      Pain Score 10/15/19 1117 0     Pain Loc --      Pain Edu? --      Excl. in [place]? --    No data found.  Updated Vital Signs BP (!) 110/59 (BP Location: Right Arm)   Pulse 72   Temp 98 F (36.7 C) (Temporal)   Resp 20   SpO2 100%   Visual Acuity Right Eye Distance:   Left Eye Distance:   Bilateral Distance:    Right Eye Near:   Left Eye Near:    Bilateral Near:     Physical Exam Vitals and nursing note reviewed.  Constitutional:      Appearance: Normal appearance. She is not ill-appearing.  HENT:     Head: Atraumatic.     Mouth/Throat:     Mouth: Mucous membranes are moist.     Pharynx: Oropharynx is clear.  Eyes:     Extraocular Movements: Extraocular movements intact.     Conjunctiva/sclera: Conjunctivae normal.  Cardiovascular:     Rate and Rhythm: Normal rate and regular rhythm.     Heart sounds: Normal heart sounds.  Pulmonary:      Effort: Pulmonary effort is normal.     Breath sounds: Normal breath sounds.  Abdominal:     General: Bowel sounds are normal. There is no distension.     Palpations: Abdomen is soft.     Tenderness: There is abdominal tenderness (suprapubic worst, some b/l lower abdominal ttp as well). There is  no right CVA tenderness, left CVA tenderness, guarding or rebound.  Musculoskeletal:        General: Normal range of motion.     Cervical back: Normal range of motion and neck supple.  Skin:    General: Skin is warm and dry.  Neurological:     Mental Status: She is alert and oriented to person, place, and time.  Psychiatric:        Mood and Affect: Mood normal.        Thought Content: Thought content normal.        Judgment: Judgment normal.    UC Treatments / Results  Labs (all labs ordered are listed, but only abnormal results are displayed) Labs Reviewed  POCT URINALYSIS DIPSTICK, ED / UC - Abnormal; Notable for the following components:      Result Value   Hgb urine dipstick TRACE (*)    All other components within normal limits  CBC WITH DIFFERENTIAL/PLATELET  COMPREHENSIVE METABOLIC PANEL  POC URINE PREG, ED    EKG   Radiology No results found.  Procedures Procedures (including critical care time)  Medications Ordered in UC Medications - No data to display  Initial Impression / Assessment and Plan / UC Course  I have reviewed the triage vital signs and the nursing notes.  Pertinent labs & imaging results that were available during my care of the patient were reviewed by me and considered in my medical decision making (see chart for details).     Will refill her norethindrone and check basic labs today. Vital signs stable, patient overall well appearing and not currently having pain. Possibly related to fibroids or her poorly controlled thyroid condition. U/A benign today other than trace RBCs which difficult to determine if coming from vaginal bleeding or possibly an  indication of urinary irritation or urinary stones. Discussed limitations without u/s or CT scan capabilities and that if pain worsens and does not subside she will need to go to ED for further evaluation. Will check basic labs today to ensure no new abnormalities and encouraged continued use of daily iron supplement, OTC pain relievers prn. F/u as scheduled with OBGYN outpatient. Work note given to return Monday.    Final Clinical Impressions(s) / UC Diagnoses   Final diagnoses:  Lower abdominal pain  Abnormal vaginal bleeding   Discharge Instructions   None    ED Prescriptions    Medication Sig Dispense Auth. Provider   norethindrone (AYGESTIN) 5 MG tablet Take 3 tablets (15 mg total) by mouth daily. 2 tablets daily after bleeding stops x 1 week, then 1 tablet daily x 1 week 90 tablet Volney American, [place]     PDMP not reviewed this encounter.   Volney American, [place] 10/15/19 1243

## 2019-10-15 NOTE — ED Triage Notes (Addendum)
Arabic interpreter, Judson Roch, [place] # [phone] used.Patient in with complaints of heavy vaginal bleeding x 5 months. Patient was prescribed Norethindrone 5 mg to take with bleeding which patient states has helped with slowing down the bleeding. Patient has complaints of abdominal pain currently and states that it occurs 2-3 times a week. Patient rates abd pain on yesterday at 8 but states today it is ok.

## 2019-11-02 MED FILL — LEVOTHYROXINE SODIUM 100 MC: 100 | 30 days supply | Qty: 45 | Fill #1

## 2019-11-25 ENCOUNTER — Encounter: Payer: Self-pay | Admitting: Family Medicine

## 2019-11-25 ENCOUNTER — Other Ambulatory Visit (HOSPITAL_COMMUNITY)
Admission: RE | Admit: 2019-11-25 | Discharge: 2019-11-25 | Disposition: A | Discharge status: Home / Self Care | Payer: 59 | Source: Ambulatory Visit | Attending: Family Medicine | Admitting: Family Medicine

## 2019-11-25 ENCOUNTER — Ambulatory Visit (INDEPENDENT_AMBULATORY_CARE_PROVIDER_SITE_OTHER): Payer: 59 | Admitting: Family Medicine

## 2019-11-25 ENCOUNTER — Other Ambulatory Visit: Payer: Self-pay

## 2019-11-25 VITALS — BP 130/74 | HR 81 | Ht 59.0 in | Wt 161.0 lb

## 2019-11-25 DIAGNOSIS — Z23 Encounter for immunization: Secondary | ICD-10-CM | POA: Diagnosis not present

## 2019-11-25 DIAGNOSIS — N920 Excessive and frequent menstruation with regular cycle: Secondary | ICD-10-CM | POA: Insufficient documentation

## 2019-11-25 DIAGNOSIS — E038 Other specified hypothyroidism: Secondary | ICD-10-CM | POA: Diagnosis not present

## 2019-11-25 DIAGNOSIS — N921 Excessive and frequent menstruation with irregular cycle: Secondary | ICD-10-CM | POA: Insufficient documentation

## 2019-11-25 DIAGNOSIS — Z124 Encounter for screening for malignant neoplasm of cervix: Secondary | ICD-10-CM | POA: Diagnosis not present

## 2019-11-25 DIAGNOSIS — Z01812 Encounter for preprocedural laboratory examination: Secondary | ICD-10-CM

## 2019-11-25 DIAGNOSIS — N939 Abnormal uterine and vaginal bleeding, unspecified: Secondary | ICD-10-CM | POA: Insufficient documentation

## 2019-11-25 LAB — POCT PREGNANCY, URINE: Preg Test, Ur: NEGATIVE

## 2019-11-25 NOTE — Progress Notes (Signed)
Subjective:    Patient ID: Kaitlyn Wise is a 49 y.o. female presenting with Menorrhagia  on 11/25/2019  HPI: Having bleeding almost daily. Will be lighter at times, but then gets heavier. She has been on Aygestin taper which helped at 3 tabs daily but bleeding resumed once this was dropped. Previously cycles were regular and lasted 7 days. Before May having cycles q 6-8 weeks. Then in May started having bleeding daily. Has thyroid issues and her bleeding continues during normal TFTs and when abnormal. Notes that she is having some bleeding and irregular when not taking thyroid medicine.  Review of Systems  Constitutional: Negative for chills and fever.  Respiratory: Negative for shortness of breath.   Cardiovascular: Negative for chest pain.  Gastrointestinal: Negative for abdominal pain, nausea and vomiting.  Genitourinary: Negative for dysuria.  Skin: Negative for rash.      Objective:    BP 130/74   Pulse 81   Ht 4\' 11"  (1.499 m)   Wt 161 lb (73 kg)   LMP 05/16/2019 (Within Days)   BMI 32.52 kg/m  Physical Exam Exam conducted with a chaperone present.  Constitutional:      General: She is not in acute distress.    Appearance: She is well-developed.  HENT:     Head: Normocephalic and atraumatic.  Eyes:     General: No scleral icterus. Cardiovascular:     Rate and Rhythm: Normal rate.  Pulmonary:     Effort: Pulmonary effort is normal.  Abdominal:     Palpations: Abdomen is soft. There is no mass.  Genitourinary:    General: Normal vulva.     Vagina: Normal.     Cervix: Normal.  Musculoskeletal:     Cervical back: Neck supple.  Skin:    General: Skin is warm and dry.  Neurological:     Mental Status: She is alert and oriented to person, place, and time.    Procedure: Patient given informed consent, signed copy in the chart, time out was performed. Appropriate time out taken. . The patient was placed in the lithotomy position and the cervix brought into view  with sterile speculum.  Portio of cervix cleansed x 2 with betadine swabs.  A tenaculum was placed in the anterior lip of the cervix.  The uterus was sounded for depth of 8 cm. A pipelle was introduced to into the uterus, suction created,  and an endometrial sample was obtained. All equipment was removed and accounted for.  The patient tolerated the procedure well.      Assessment & Plan:   Problem List Items Addressed This Visit      Unprioritized   Hypothyroidism    Has likely contributed to menstrual irregularities. Suggest f/u with PCP to ensure she is well controlled.      Menorrhagia    Needs complete w/u. Pelvic sono and labs and EMB done today. Continue Aygestin 15 mg daily until w/u complete.      Relevant Orders   US PELVIC COMPLETE WITH TRANSVAGINAL   Surgical pathology( [place]/ [facility])    Other Visit Diagnoses    Flu vaccine need    -  Primary   Relevant Orders   Flu Vaccine QUAD 36+ mos IM (Completed)   Screening for cervical cancer       Relevant Orders   Cytology - PAP( [place])      Total time in review of prior notes, pathology, labs, history taking, review with patient, exam, note  writing, discussion of options, plan for next steps, alternatives and risks of treatment: 33 minutes.  Return in about 4 weeks (around 12/23/2019) for in person , needs U/S, a follow-up.  Donnamae Jude 11/25/2019 3:44 PM

## 2019-11-25 NOTE — Assessment & Plan Note (Signed)
Needs complete w/u. Pelvic sono and labs and EMB done today. Continue Aygestin 15 mg daily until w/u complete.

## 2019-11-25 NOTE — Assessment & Plan Note (Signed)
Has likely contributed to menstrual irregularities. Suggest f/u with PCP to ensure she is well controlled.

## 2019-11-25 NOTE — Patient Instructions (Signed)
Menorrhagia Menorrhagia is when your menstrual periods are heavy or last longer than normal. Follow these instructions at home: Medicines   Take over-the-counter and prescription medicines exactly as told by your doctor. This includes iron pills.  Do not change or switch medicines without asking your doctor.  Do not take aspirin or medicines that contain aspirin 1 week before or during your period. Aspirin may make bleeding worse. General instructions  If you need to change your pad or tampon more than once every 2 hours, limit your activity until the bleeding stops.  Iron pills can cause problems when pooping (constipation). To prevent or treat pooping problems while taking prescription iron pills, your doctor may suggest that you: ? Drink enough fluid to keep your pee (urine) clear or pale yellow. ? Take over-the-counter or prescription medicines. ? Eat foods that are high in fiber. These foods include:  Fresh fruits and vegetables.  Whole grains.  Beans. ? Limit foods that are high in fat and processed sugars. This includes fried and sweet foods.  Eat healthy meals and foods that are high in iron. Foods that have a lot of iron include: ? Leafy green vegetables. ? Meat. ? Liver. ? Eggs. ? Whole grain breads and cereals.  Do not try to lose weight until your heavy bleeding has stopped and you have normal amounts of iron in your blood. If you need to lose weight, work with your doctor.  Keep all follow-up visits as told by your doctor. This is important. Contact a doctor if:  You soak through a pad or tampon every 1 or 2 hours, and this happens every time you have a period.  You need to use pads and tampons at the same time because you are bleeding so much.  You are taking medicine and you: ? Feel sick to your stomach (nauseous). ? Throw up (vomit). ? Have watery poop (diarrhea).  You have other problems that may be related to the medicine you are taking. Get help  right away if:  You soak through more than a pad or tampon in 1 hour.  You pass clots bigger than 1 inch (2.5 cm) wide.  You feel short of breath.  You feel like your heart is beating too fast.  You feel dizzy or you pass out (faint).  You feel very weak or tired. Summary  Menorrhagia is when your menstrual periods are heavy or last longer than normal.  Take over-the-counter and prescription medicines exactly as told by your doctor. This includes iron pills.  Contact a doctor if you soak through more than a pad or tampon in 1 hour or are passing large clots. This information is not intended to replace advice given to you by your health care provider. Make sure you discuss any questions you have with your health care provider. Document Revised: 04/10/2017 Document Reviewed: 01/23/2016 Elsevier Patient Education  2020 Elsevier Inc.  

## 2019-11-26 ENCOUNTER — Ambulatory Visit: Payer: 59 | Admitting: Family Medicine

## 2019-11-27 LAB — SURGICAL PATHOLOGY

## 2019-11-30 LAB — CYTOLOGY - PAP
Comment: NEGATIVE
Diagnosis: NEGATIVE
High risk HPV: NEGATIVE

## 2019-11-30 MED FILL — LEVOTHYROXINE SODIUM 100 MC: 100 | 30 days supply | Qty: 45 | Fill #2

## 2019-12-09 ENCOUNTER — Telehealth: Payer: Self-pay

## 2019-12-09 ENCOUNTER — Other Ambulatory Visit: Payer: Self-pay | Admitting: Family Medicine

## 2019-12-09 ENCOUNTER — Ambulatory Visit: Payer: 59

## 2019-12-09 ENCOUNTER — Telehealth: Payer: Self-pay | Admitting: Obstetrics and Gynecology

## 2019-12-09 MED ORDER — NORETHINDRONE ACETATE 5 MG PO TABS
15.0000 mg | ORAL_TABLET | Freq: Every day | ORAL | 1 refills | Status: DC
Start: 2019-12-09 — End: 2020-09-03

## 2019-12-09 NOTE — Telephone Encounter (Signed)
SWP through [employer] - had to move pelvic ultrasound from 12/09/19 to 12//8/21 - due to radiology ultrasound tech. being sick. Patient request that I contact referring provider about refilling the prescription for Aygestin to help control bleeding. Routing this note to Dr. Kennon Rounds for her to contact patient about prescription refill

## 2019-12-09 NOTE — Telephone Encounter (Signed)
NA

## 2019-12-15 MED FILL — NORETHINDRONE 5 MG TABLET: 5 | 30 days supply | Qty: 90 | Fill #0

## 2019-12-23 ENCOUNTER — Encounter: Payer: Self-pay | Admitting: Family Medicine

## 2019-12-23 ENCOUNTER — Ambulatory Visit (INDEPENDENT_AMBULATORY_CARE_PROVIDER_SITE_OTHER): Payer: 59 | Admitting: Family Medicine

## 2019-12-23 ENCOUNTER — Ambulatory Visit
Admission: RE | Admit: 2019-12-23 | Discharge: 2019-12-23 | Disposition: A | Discharge status: Home / Self Care | Payer: 59 | Source: Ambulatory Visit | Attending: Family Medicine | Admitting: Family Medicine

## 2019-12-23 ENCOUNTER — Other Ambulatory Visit: Payer: Self-pay

## 2019-12-23 VITALS — BP 124/80 | HR 93 | Ht 62.0 in | Wt 163.1 lb

## 2019-12-23 DIAGNOSIS — Z1331 Encounter for screening for depression: Secondary | ICD-10-CM | POA: Diagnosis not present

## 2019-12-23 DIAGNOSIS — N939 Abnormal uterine and vaginal bleeding, unspecified: Secondary | ICD-10-CM | POA: Diagnosis not present

## 2019-12-23 DIAGNOSIS — N921 Excessive and frequent menstruation with irregular cycle: Secondary | ICD-10-CM | POA: Insufficient documentation

## 2019-12-23 NOTE — Assessment & Plan Note (Signed)
Here for f/u of AUB. EMB w benign path, TVUS showing likely fibroid. Discussed that although [place] read is equivocal, given her path this likely represents a polyp. Reviewed options of d/c Aygestin and monitoring symptom, trial of hormonal IUD, or hysteroscopy for definitive diagnosis and treatment +/- IUD. Patient elects for hysteroscopy, will schedule for virtual visit for consent w Dr. Kennon Rounds, message sent to schedule procedure.

## 2019-12-23 NOTE — Progress Notes (Signed)
GYNECOLOGY OFFICE VISIT NOTE  History:   Eleni Frank is a 49 y.o. G1P1 here today for follow up of AUB.  Seen by Dr. Kennon Rounds on 11/25/2019 At that reported change in regular cycles to irregular bleeding and sometimes daily bleeding EMB done which showed benign polyp and no evidence of hyperplasia or malignancy Pap was normal, neg HPV She was continued on Agestin and sent for US TVUS showed :  "IMPRESSION: Left lateral subserosal fibroid, 4.7 cm.  Heterogeneous echotexture throughout the uterus which can be seen with adenomyosis.  Focal mass lesion with vascularity within the endometrium measuring 18 mm. Consider further evaluation with sonohysterogram for confirmation prior to hysteroscopy. Endometrial sampling should also be considered if patient is at high risk for endometrial carcinoma. (Ref: Radiological Reasoning: Algorithmic Workup of Abnormal Vaginal Bleeding with Endovaginal Sonography and Sonohysterography. AJR 2008; [id number]:[id number])   Electronically Signed   By: Rolm Baptise M.D.   On: 12/23/2019 10:16"  Today patient reports that bleeding stopped after taking Aygestin In the past few days though she's had a little spotting despite taking Aygestin Interested in discussing treatment options for AUB Relieved that pathology was benign  Past Medical History:  Diagnosis Date  . Dyslipidemia   . Thyroid disease   . Uterine fibroid   . Vertigo     Past Surgical History:  Procedure Laterality Date  . CHOLECYSTECTOMY    . CHOLECYSTECTOMY      The following portions of the patient's history were reviewed and updated as appropriate: allergies, current medications, past family history, past medical history, past social history, past surgical history and problem list.   Health Maintenance:  Normal pap and negative HRHPV on 11/25/2019.  Normal mammogram 2017, scheduled for repeat tomorrow.   Review of Systems:  Pertinent items noted in HPI and remainder of  comprehensive ROS otherwise negative.  Physical Exam:  BP 124/80   Pulse 93   Ht 5\' 2"  (1.575 m)   Wt 163 lb 1.6 oz (74 kg)   BMI 29.83 kg/m  CONSTITUTIONAL: Well-developed, well-nourished female in no acute distress.  HEENT:  Normocephalic, atraumatic. External right and left ear normal. No scleral icterus.  NECK: Normal range of motion, supple, no masses noted on observation SKIN: No rash noted. Not diaphoretic. No erythema. No pallor. MUSCULOSKELETAL: Normal range of motion. No edema noted. NEUROLOGIC: Alert and oriented to person, place, and time. Normal muscle tone coordination.  PSYCHIATRIC: Normal mood and affect. Normal behavior. Normal judgment and thought content. RESPIRATORY: Effort normal, no problems with respiration noted PELVIC: Deferred  Labs and Imaging No results found for this or any previous visit (from the past 168 hour(s)). US PELVIC COMPLETE WITH TRANSVAGINAL  Result Date: 12/23/2019 CLINICAL DATA:  Menorrhagia with irregular cycles EXAM: TRANSABDOMINAL AND TRANSVAGINAL ULTRASOUND OF PELVIS TECHNIQUE: Both transabdominal and transvaginal ultrasound examinations of the pelvis were performed. Transabdominal technique was performed for global imaging of the pelvis including uterus, ovaries, adnexal regions, and pelvic cul-de-sac. It was necessary to proceed with endovaginal exam following the transabdominal exam to visualize the uterus, endometrium, ovaries and adnexa. COMPARISON:  10/10/2016 FINDINGS: Uterus Measurements: 11.3 x 5.6 x 7.6 cm = volume: 253 mL. Left lateral body fibroid measures up to 4.7 cm. Heterogeneous echotexture throughout the uterus otherwise. Endometrium Thickness: 10 mm in thickness. Echogenic mass centrally within the endometrium measuring up to 18 mm with vascularity. Right ovary Measurements: 2.2 x 0.8 x 1.2 cm = volume: 1 mL. Normal appearance/no adnexal mass. Left ovary Measurements: 1.9  x 1.11 x 1.1 cm = volume: 1.1 mL. Normal appearance/no  adnexal mass. Other findings No abnormal free fluid. IMPRESSION: Left lateral subserosal fibroid, 4.7 cm. Heterogeneous echotexture throughout the uterus which can be seen with adenomyosis. Focal mass lesion with vascularity within the endometrium measuring 18 mm. Consider further evaluation with sonohysterogram for confirmation prior to hysteroscopy. Endometrial sampling should also be considered if patient is at high risk for endometrial carcinoma. (Ref: Radiological Reasoning: Algorithmic Workup of Abnormal Vaginal Bleeding with Endovaginal Sonography and Sonohysterography. AJR 2008; 546:F68-12) Electronically Signed   By: Rolm Baptise M.D.   On: 12/23/2019 10:16      Assessment and Plan:   Problem List Items Addressed This Visit      Genitourinary   Abnormal uterine bleeding (AUB)    Here for f/u of AUB. EMB w benign path, TVUS showing likely fibroid. Discussed that although [place] read is equivocal, given her path this likely represents a polyp. Reviewed options of d/c Aygestin and monitoring symptom, trial of hormonal IUD, or hysteroscopy for definitive diagnosis and treatment +/- IUD. Patient elects for hysteroscopy, will schedule for virtual visit for consent w Dr. Kennon Rounds, message sent to schedule procedure.          Routine preventative health maintenance measures emphasized. Please refer to After Visit Summary for other counseling recommendations.   Return in about 2 weeks (around 01/06/2020) for next available for virtual visit w Dr. Kennon Rounds to discuss hysteroscopy.    Total face-to-face time with patient: 20 minutes.  Over 50% of encounter was spent on counseling and coordination of care.   Augustin Coupe, [place] for [employer], [place]

## 2019-12-23 NOTE — Addendum Note (Signed)
Addended by: Bethanne Ginger on: 12/23/2019 04:57 PM   Modules accepted: Orders

## 2019-12-24 ENCOUNTER — Ambulatory Visit: Payer: 59

## 2019-12-24 ENCOUNTER — Other Ambulatory Visit: Payer: Self-pay

## 2019-12-24 ENCOUNTER — Ambulatory Visit
Admission: RE | Admit: 2019-12-24 | Discharge: 2019-12-24 | Disposition: A | Discharge status: Home / Self Care | Payer: 59 | Source: Ambulatory Visit | Attending: Family Medicine | Admitting: Family Medicine

## 2019-12-24 DIAGNOSIS — N644 Mastodynia: Secondary | ICD-10-CM

## 2019-12-25 ENCOUNTER — Telehealth: Payer: Self-pay

## 2019-12-25 ENCOUNTER — Other Ambulatory Visit: Payer: Self-pay | Admitting: Family Medicine

## 2019-12-25 ENCOUNTER — Ambulatory Visit (INDEPENDENT_AMBULATORY_CARE_PROVIDER_SITE_OTHER): Payer: 59 | Admitting: Clinical

## 2019-12-25 DIAGNOSIS — F332 Major depressive disorder, recurrent severe without psychotic features: Secondary | ICD-10-CM | POA: Diagnosis not present

## 2019-12-25 MED ORDER — FLUOXETINE HCL 20 MG PO TABS: 20.0000 mg | ORAL_TABLET | Freq: Every day | ORAL | 3 refills | Status: DC

## 2019-12-25 NOTE — Telephone Encounter (Signed)
Pt has viewed results on mychart.

## 2019-12-25 NOTE — Telephone Encounter (Signed)
-----   Message from Charlott Rakes, MD sent at 12/25/2019 11:17 AM EST ----- Mammogram is negative for malignancy

## 2019-12-25 NOTE — BH Specialist Note (Addendum)
Integrated Behavioral Health via Telemedicine Visit  12/25/2019 [place] [id number]  Number of [place] visits: 1 Session Start time: 8:13  Session End time: 8:45 Total time: 32  Referring Provider: Clayton Lefort, MD Patient/Family location: Home [facility] Provider location: [facility]  All persons participating in visit: [name] and [place]   Types of Service: Individual psychotherapy  I connected with [place] and/or [place] by Telephone and verified that I am speaking with the correct person using two identifiers.    Discussed confidentiality: Yes   I discussed the limitations of telemedicine and the availability of in person appointments.  Discussed there is a possibility of technology failure and discussed alternative modes of communication if that failure occurs.  I discussed that engaging in this telemedicine visit, they consent to the provision of behavioral healthcare and the services will be billed under their insurance.  Patient and/or legal guardian expressed understanding and consented to Telemedicine visit: Yes   Presenting Concerns: Patient and/or family reports the following symptoms/concerns: Pt states her primary concern today is being off fluoxentine for depression and anxiety for "4-5 months"; noticing "very anxious, not sleeping well, eating a lot without being hungry, very tired all the time"feeling cold or hot when others around her are not, and forgetfulness (showing up at work the wrong time and day); Pt feels best with prayer ritual and reading Koran if she awakens at night.  Duration of problem: increase in 4-5 months; Severity of problem: severe  Patient and/or Family's Strengths/Protective Factors: Social connections and Concrete supports in place (healthy food, safe environments, etc.)  Goals  Addressed: Patient will: 1.  Reduce symptoms of: anxiety, depression, insomnia and stress  2.  Increase knowledge and/or ability of: healthy habits  3.  Demonstrate ability to: Increase healthy adjustment to current life circumstances and Increase motivation to adhere to plan of care  Progress towards Goals: Ongoing  Interventions: Interventions utilized:  Motivational Interviewing, Medication Monitoring and Psychoeducation and/or Health Education Standardized Assessments completed: PHQ9/GAD7   Patient and/or Family Response: Pt agrees to treatment plan  Assessment: Patient currently experiencing Major depressive disorder, recurrent, severe, without psychotic features.   Patient may benefit from psychoeducation and brief therapeutic interventions regarding coping with symptoms of depression, anxiety and life stress .  Plan: 1. Follow up with behavioral health clinician on : Three weeks 2. Behavioral recommendations:  -Continue using nightly prayer ritual upon waking; consider using at bedtime nightly for increased calm prior to sleep -Begin taking Prozac, as prescribed by PCP -Call PCP [facility], to set up follow up appointment with Dr Margarita Rana 3. Referral(s): [place] (In Clinic)  I discussed the assessment and treatment plan with the patient and/or parent/guardian. They were provided an opportunity to ask questions and all were answered. They agreed with the plan and demonstrated an understanding of the instructions.   They were advised to call back or seek an in-person evaluation if the symptoms worsen or if the condition fails to improve as anticipated.  Garlan Fair, LCSW   Depression screen [facility] 2/9 12/23/2019 07/08/2019 05/06/2019 10/22/2017 08/01/2017  Decreased Interest 3 3 0 1 0  Down, Depressed, Hopeless 3 3 3 3  0  PHQ - 2 Score 6 6 3 4  0  Altered sleeping 3 3 3 3  -  Tired, decreased energy 2 3 2 3  -  Change  in  appetite 3 3 2 3  -  Feeling bad or failure about yourself  3 2 1  0 -  Trouble concentrating 3 2 1 3  -  Moving slowly or fidgety/restless 3 2 3 1  -  Suicidal thoughts 0 0 0 0 -  PHQ-9 Score 23 21 15 17  -  Difficult doing work/chores - - - - -   GAD 7 : Generalized Anxiety Score 12/23/2019 07/08/2019 05/06/2019 10/22/2017  Nervous, Anxious, on Edge 3 1 0 3  Control/stop worrying 2 2 3 1   Worry too much - different things 2 2 3 3   Trouble relaxing 2 2 3 2   Restless 0 1 0 1  Easily annoyed or irritable 3 1 0 1  Afraid - awful might happen 3 1 0 2  Total GAD 7 Score 15 10 9 13   Anxiety Difficulty - - - -

## 2019-12-30 ENCOUNTER — Other Ambulatory Visit: Payer: Self-pay | Admitting: Pharmacist

## 2019-12-30 MED ORDER — FLUOXETINE HCL 20 MG PO CAPS: 20.0000 mg | ORAL_CAPSULE | Freq: Every day | ORAL | 3 refills | Status: DC

## 2019-12-30 MED FILL — FLUoxetine HCL 20 MG CAPS: 20 | 30 days supply | Qty: 30 | Fill #0

## 2019-12-30 MED FILL — LEVOTHYROXINE SODIUM 100 MC: 100 | 30 days supply | Qty: 45 | Fill #3

## 2019-12-30 NOTE — Progress Notes (Signed)
Resent fluoxetine for capsules for insurance purposes.

## 2020-01-13 NOTE — BH Specialist Note (Signed)
Integrated Behavioral Health via Telemedicine Visit  01/13/2020 Kaitlyn Wise [id number]  Number of Integrated Behavioral Health visits: 2 Session Start time: 8:16  Session End time: 8:54 Total time: 47  Referring Provider: Merian Capron, MD Patient/Family location: Home [facility] Provider location: [facility]  All persons participating in visit: [name] and [facility] Kaitlyn Wise  Types of Service: Telephone visit  I connected with Delma Unknown F[place] and/or [place]ale Rueben Bash Buchan's n/a by Telephone  (Video is Caregility application) and verified that I am speaking with the correct person using two identifiers.Discussed confidentiality: Yes   I discussed the limitations of telemedicine and the availability of in person appointments.  Discussed there is a possibility of technology failure and discussed alternative modes of communication if that failure occurs.  I discussed that engaging in this telemedicine visit, they consent to the provision of behavioral healthcare and the services will be billed under their insurance.  Patient and/or legal guardian expressed understanding and consented to Telemedicine visit: Yes   Presenting Concerns: Patient and/or family reports the following symptoms/concerns: Pt states she started taking Prozac with no known negative side effects (starting to like shopping and eating again, less depressed); is most concerned about increasingly problematic forgetfulness (ex. Shopping and forgetting she needs to go through check out line).  Pt also feeling extreme fatigue, overeating and oversleeping, feeling "too calm, like I have no feelings". Pt denies SI; "never wanted to hurt myself, but (for over 10 years) "wonder why I was born, what's my purpose". Pt wonders if continued bleeding for 7 months and/or hypothyroidism may be making her so tired. Duration of problem:  Ongoing; Severity of problem: moderate  Patient and/or Family's Strengths/Protective Factors: Social connections and Concrete supports in place (healthy food, safe environments, etc.)  Goals Addressed: Patient will: 1.  Reduce symptoms of: anxiety, depression, insomnia and stress  2.  Demonstrate ability to: Increase healthy adjustment to current life circumstances and Increase motivation to adhere to plan of care  Progress towards Goals: Ongoing  Interventions: Interventions utilized:  Motivational Interviewing and Medication Monitoring Standardized Assessments completed: GAD-7 and PHQ 9  Patient and/or Family Response: Pt agrees to revised treatment plan  Assessment: Patient currently experiencing Major depressive disorder, recurrent, severe, without psychotic features.   Patient may benefit from continued psychoeducation and brief therapeutic interventions regarding coping with symptoms of anxiety, depression, stress .  Plan: 1. Follow up with behavioral health clinician on : One month 2. Behavioral recommendations:  -Continue taking Prozac as prescribed by PCP -Call [facility] today to set up f/u appointment with PCP -Continue using nightly prayer ritual upon waking at night for as long as remains helpful for sleep 3. Referral(s): Integrated [employer] [facility] (In Clinic)  I discussed the assessment and treatment plan with the patient and/or parent/guardian. They were provided an opportunity to ask questions and all were answered. They agreed with the plan and demonstrated an understanding of the instructions.   They were advised to call back or seek an in-person evaluation if the symptoms worsen or if the condition fails to improve as anticipated.  Rae Lips, LCSW   Depression screen [facility] 2/9 01/20/2020 12/23/2019 07/08/2019 05/06/2019 10/22/2017  Decreased Interest 1 3 3  0 1  Down, Depressed, Hopeless 1 3 3 3 3   PHQ - 2 Score 2 6 6 3 4   Altered sleeping 3 3 3 3 3    Tired, decreased energy 3 2 3  2 3  Change in appetite 3 3 3 2 3   Feeling bad or failure about yourself  1 3 2 1  0  Trouble concentrating 2 3 2 1 3   Moving slowly or fidgety/restless 1 3 2 3 1   Suicidal thoughts 0 0 0 0 0  PHQ-9 Score 15 23 21 15 17   Difficult doing work/chores - - - - -  Some recent data might be hidden   GAD 7 : Generalized Anxiety Score 01/20/2020 12/23/2019 07/08/2019 05/06/2019  Nervous, Anxious, on Edge 0 3 1 0  Control/stop worrying 1 2 2 3   Worry too much - different things 1 2 2 3   Trouble relaxing 1 2 2 3   Restless 0 0 1 0  Easily annoyed or irritable 0 3 1 0  Afraid - awful might happen 3 3 1  0  Total GAD 7 Score 6 15 10 9   Anxiety Difficulty - - - -

## 2020-01-20 ENCOUNTER — Ambulatory Visit (INDEPENDENT_AMBULATORY_CARE_PROVIDER_SITE_OTHER): Payer: 59 | Admitting: Clinical

## 2020-01-20 DIAGNOSIS — F332 Major depressive disorder, recurrent severe without psychotic features: Secondary | ICD-10-CM | POA: Diagnosis not present

## 2020-01-20 NOTE — Patient Instructions (Signed)
[facility] [address] [place], [place] [address] [phone] (main office) [phone] (Drewey Begue's office)   

## 2020-01-21 ENCOUNTER — Telehealth: Payer: Self-pay | Admitting: Family Medicine

## 2020-01-21 NOTE — Telephone Encounter (Signed)
Can you please schedule an earlier appointment to evaluate her hypothyroidism? Thanks.

## 2020-01-21 NOTE — Telephone Encounter (Signed)
Appt moved to 02/15/20

## 2020-02-02 ENCOUNTER — Encounter (HOSPITAL_COMMUNITY): Payer: Self-pay | Admitting: Emergency Medicine

## 2020-02-02 ENCOUNTER — Other Ambulatory Visit: Payer: Self-pay

## 2020-02-02 ENCOUNTER — Other Ambulatory Visit (HOSPITAL_COMMUNITY): Payer: Self-pay | Admitting: Urgent Care

## 2020-02-02 ENCOUNTER — Ambulatory Visit (HOSPITAL_COMMUNITY)
Admission: EM | Admit: 2020-02-02 | Discharge: 2020-02-02 | Disposition: A | Discharge status: Home / Self Care | Payer: 59 | Attending: Urgent Care | Admitting: Urgent Care

## 2020-02-02 DIAGNOSIS — Z20822 Contact with and (suspected) exposure to covid-19: Secondary | ICD-10-CM | POA: Diagnosis not present

## 2020-02-02 DIAGNOSIS — R0981 Nasal congestion: Secondary | ICD-10-CM | POA: Insufficient documentation

## 2020-02-02 DIAGNOSIS — B349 Viral infection, unspecified: Secondary | ICD-10-CM | POA: Insufficient documentation

## 2020-02-02 DIAGNOSIS — R059 Cough, unspecified: Secondary | ICD-10-CM | POA: Diagnosis present

## 2020-02-02 DIAGNOSIS — R0789 Other chest pain: Secondary | ICD-10-CM | POA: Insufficient documentation

## 2020-02-02 LAB — SARS CORONAVIRUS 2 (TAT 6-24 HRS): SARS Coronavirus 2: NEGATIVE

## 2020-02-02 MED ORDER — PSEUDOEPHEDRINE HCL 60 MG PO TABS: 60.0000 mg | ORAL_TABLET | Freq: Three times a day (TID) | ORAL | 0 refills | Status: DC | PRN

## 2020-02-02 MED ORDER — CETIRIZINE HCL 10 MG PO TABS: 10.0000 mg | ORAL_TABLET | Freq: Every day | ORAL | 0 refills | Status: DC

## 2020-02-02 MED ORDER — PROMETHAZINE-DM 6.25-15 MG/5ML PO SYRP: 5.0000 mL | ORAL_SOLUTION | Freq: Every evening | ORAL | 0 refills | Status: DC | PRN

## 2020-02-02 MED ORDER — BENZONATATE 100 MG PO CAPS: 100.0000 mg | ORAL_CAPSULE | Freq: Three times a day (TID) | ORAL | 0 refills | Status: DC | PRN

## 2020-02-02 MED FILL — BENZONATATE 100 MG CAPS: 100 | 10 days supply | Qty: 60 | Fill #0

## 2020-02-02 MED FILL — PROMETHAZINE W/DM SYRUP: 6.25-15 | 20 days supply | Qty: 100 | Fill #0

## 2020-02-02 NOTE — ED Provider Notes (Signed)
[place]   MRN: [id number] DOB: [birth year]  Subjective:   Kaitlyn Wise is a 49 y.o. female presenting for 4 day history of cough, fever, sinus headache, congestion, loose stools. Had chest pain yesterday but resolved on its own. Denies history of lung disorders, asthma. No smoking. No COVID vaccination. No heart conditions, history of MI. No history of blood clots. Has a history of uterine fibroids, has to have surgery in ~1 month.   No current facility-administered medications for this encounter.  Current Outpatient Medications:  .  dicyclomine (BENTYL) 10 MG capsule, Take by mouth., Disp: , Rfl:  .  FLUoxetine (PROZAC) 20 MG capsule, Take 1 capsule (20 mg total) by mouth daily., Disp: 30 capsule, Rfl: 3 .  fluticasone (FLONASE) 50 MCG/ACT nasal spray, Place into the nose., Disp: , Rfl:  .  levothyroxine (SYNTHROID) 100 MCG tablet, Take 1.5 tablets (150 mcg total) by mouth daily., Disp: 45 tablet, Rfl: 3 .  norethindrone (AYGESTIN) 5 MG tablet, Take 3 tablets (15 mg total) by mouth daily. 2 tablets daily after bleeding stops x 1 week, then 1 tablet daily x 1 week, Disp: 90 tablet, Rfl: 1   Allergies  Allergen Reactions  . Fish Allergy Itching  . Milk-Related Compounds Diarrhea and Itching    Past Medical History:  Diagnosis Date  . Dyslipidemia   . Thyroid disease   . Uterine fibroid   . Vertigo      Past Surgical History:  Procedure Laterality Date  . CHOLECYSTECTOMY    . CHOLECYSTECTOMY      Family History  Problem Relation Age of Onset  . Diabetes Mother   . Hypertension Mother   . Diabetes Father   . Hypertension Father   . Heart disease Brother     Social History   Tobacco Use  . Smoking status: Never Smoker  . Smokeless tobacco: Never Used  Vaping Use  . Vaping Use: Never used  Substance Use Topics  . Alcohol use: No  . Drug use: No    ROS   Objective:   Vitals: BP 127/63 (BP Location: Right Arm)   Pulse  78   Temp 97.8 F (36.6 C) (Oral)   Resp (!) 24   SpO2 100%   Physical Exam Constitutional:      General: She is not in acute distress.    Appearance: Normal appearance. She is well-developed. She is not ill-appearing, toxic-appearing or diaphoretic.  HENT:     Head: Normocephalic and atraumatic.     Nose: Nose normal.     Mouth/Throat:     Mouth: Mucous membranes are moist.     Pharynx: Oropharynx is clear.  Eyes:     General: No scleral icterus.    Extraocular Movements: Extraocular movements intact.     Pupils: Pupils are equal, round, and reactive to light.  Cardiovascular:     Rate and Rhythm: Normal rate and regular rhythm.     Pulses: Normal pulses.     Heart sounds: Normal heart sounds. No murmur heard. No friction rub. No gallop.   Pulmonary:     Effort: Pulmonary effort is normal. No respiratory distress.     Breath sounds: Normal breath sounds. No stridor. No wheezing, rhonchi or rales.  Skin:    General: Skin is warm and dry.     Findings: No rash.  Neurological:     General: No focal deficit present.     Mental Status: She is alert  and oriented to person, place, and time.  Psychiatric:        Mood and Affect: Mood normal.        Behavior: Behavior normal.        Thought Content: Thought content normal.        Judgment: Judgment normal.     Assessment and Plan :   PDMP not reviewed this encounter.  1. Viral syndrome   2. Cough   3. Nasal congestion   4. Atypical chest pain     Physical exam findings and vital signs stable for outpatient management. Will manage for viral illness such as viral URI, viral syndrome, viral rhinitis, COVID-19. Counseled patient on nature of COVID-19 including modes of transmission, diagnostic testing, management and supportive care.  Offered scripts for symptomatic relief. COVID 19 testing is pending. Counseled patient on potential for adverse effects with medications prescribed/recommended today, ER and return-to-clinic  precautions discussed, patient verbalized understanding.     Jaynee Eagles, PA-C 02/02/20 1418

## 2020-02-02 NOTE — ED Triage Notes (Signed)
Pt c/o cold sx onset 4 days associated w/cough, fevers, headache, nasal congestion, diarrhea  Denies vomiting, abd pain  A&O x4... NAD.Marland Kitchen. ambulatory

## 2020-02-02 NOTE — Discharge Instructions (Addendum)

## 2020-02-05 ENCOUNTER — Telehealth (INDEPENDENT_AMBULATORY_CARE_PROVIDER_SITE_OTHER): Payer: 59 | Admitting: Family Medicine

## 2020-02-05 ENCOUNTER — Encounter: Payer: Self-pay | Admitting: Family Medicine

## 2020-02-05 DIAGNOSIS — N939 Abnormal uterine and vaginal bleeding, unspecified: Secondary | ICD-10-CM

## 2020-02-05 NOTE — Patient Instructions (Signed)
Endometrial Ablation Endometrial ablation is a procedure that destroys the thin inner layer of the lining of the uterus (endometrium). This procedure may be done:  To stop heavy menstrual periods.  To stop bleeding that is causing anemia.  To control irregular bleeding.  To treat bleeding caused by small tumors (fibroids) in the endometrium. This procedure is often done as an alternative to major surgery, such as removal of the uterus and cervix (hysterectomy). As a result of this procedure:  You may not be able to have children. However, if you have not yet gone through menopause: ? You may still have a small chance of getting pregnant. ? You will need to use a reliable method of birth control after the procedure to prevent pregnancy.  You may stop having a menstrual period, or you may have only a small amount of bleeding during your period. Menstruation may return several years after the procedure. Tell a health care provider about:  Any allergies you have.  All medicines you are taking, including vitamins, herbs, eye drops, creams, and over-the-counter medicines.  Any problems you or family members have had with the use of anesthetic medicines.  Any blood disorders you have.  Any surgeries you have had.  Any medical conditions you have.  Whether you are pregnant or may be pregnant. What are the risks? Generally, this is a safe procedure. However, problems may occur, including:  A hole (perforation) in the uterus or bowel.  Infection in the uterus, bladder, or vagina.  Bleeding.  Allergic reaction to medicines.  Damage to nearby structures or organs.  An air bubble in the lung (air embolus).  Problems with pregnancy.  Failure of the procedure.  Decreased ability to diagnose cancer in the endometrium. Scar tissue forms after the procedure, making it more difficult to get a sample of the uterine lining. What happens before the procedure? Medicines Ask your health  care provider about:  Changing or stopping your regular medicines. This is especially important if you take diabetes medicines or blood thinners.  Taking medicines such as aspirin and ibuprofen. These medicines can thin your blood. Do not take these medicines before your procedure if your doctor tells you not to take them.  Taking over-the-counter medicines, vitamins, herbs, and supplements. Tests  You will have tests of your endometrium to make sure there are no precancerous cells or cancer cells present.  You may have an ultrasound of the uterus. General instructions  Do not use any products that contain nicotine or tobacco for at least 4 weeks before the procedure. These include cigarettes, chewing tobacco, and vaping devices, such as e-cigarettes. If you need help quitting, ask your health care provider.  You may be given medicines to thin the endometrium.  Ask your health care provider what steps will be taken to help prevent infection. These steps may include: ? Removing hair at the surgery site. ? Washing skin with a germ-killing soap. ? Taking antibiotic medicine.  Plan to have a responsible adult take you home from the hospital or clinic.  Plan to have a responsible adult care for you for the time you are told after you leave the hospital or clinic. This is important. What happens during the procedure?  You will lie on an exam table with your feet and legs supported as in a pelvic exam.  An IV will be inserted into one of your veins.  You will be given a medicine to help you relax (sedative).  A surgical tool with   a light and camera (resectoscope) will be inserted into your vagina and moved into your uterus. This allows your surgeon to see inside your uterus.  Endometrial tissue will be destroyed and removed, using one of the following methods: ? Radiofrequency. This uses an electrical current to destroy the endometrium. ? Cryotherapy. This uses extreme cold to freeze  the endometrium. ? Heated fluid. This uses a heated salt and water (saline) solution to destroy the endometrium. ? Microwave. This uses high-energy microwaves to heat up the endometrium and destroy it. ? Thermal balloon. This involves inserting a catheter with a balloon tip into the uterus. The balloon tip is filled with heated fluid to destroy the endometrium. The procedure may vary among health care providers and hospitals.   What happens after the procedure?  Your blood pressure, heart rate, breathing rate, and blood oxygen level will be monitored until you leave the hospital or clinic.  You may have vaginal bleeding for 4-6 weeks after the procedure. You may also have: ? Cramps. ? A thin, watery vaginal discharge that is light pink or brown. ? A need to urinate more than usual. ? Nausea.  If you were given a sedative during the procedure, it can affect you for several hours. Do not drive or operate machinery until your health care provider says that it is safe.  Do not have sex or insert anything into your vagina until your health care provider says it is safe. Summary  Endometrial ablation is done to treat many causes of heavy menstrual bleeding. The procedure destroys the thin inner layer of the lining of the uterus (endometrium).  This procedure is often done as an alternative to major surgery, such as removal of the uterus and cervix (hysterectomy).  Plan to have a responsible adult take you home from the hospital or clinic. This information is not intended to replace advice given to you by your health care provider. Make sure you discuss any questions you have with your health care provider. Document Revised: 07/23/2019 Document Reviewed: 07/23/2019 Elsevier Patient Education  2021 Elsevier Inc.       

## 2020-02-05 NOTE — Assessment & Plan Note (Signed)
For HTA with hysteroscopy. Has not been sterilized and is 70 and is not married. Discussed sterilization at length. She is not currently having sex. Continue Aygestin for now. Risks include but are not limited to bleeding, infection, injury to surrounding structures, including bowel, bladder and ureters, blood clots, and death.  Likelihood of success is high.

## 2020-02-05 NOTE — Progress Notes (Signed)
     GYNECOLOGY VIRTUAL VISIT ENCOUNTER NOTE  Provider location: [facility] [place] [facility] [place] [facility]   I connected with [place] on 02/05/20 at 10:35 AM EST by MyChart Video Encounter at home and verified that I am speaking with the correct person using two identifiers.   I discussed the limitations, risks, security and privacy concerns of performing an evaluation and management service virtually and the availability of in person appointments. I also discussed with the patient that there may be a patient responsible charge related to this service. The patient expressed understanding and agreed to proceed.   History:  Kaitlyn Wise is a 50 y.o. G1P1 female being evaluated today for here for bleeding.  She has had a normal EMB. We were discussing endometrial ablation and hysteroscopy.  She denies any abnormal vaginal discharge, pelvic pain or other concerns.       Past Medical History:  Diagnosis Date  . Dyslipidemia   . Thyroid disease   . Uterine fibroid   . Vertigo    Past Surgical History:  Procedure Laterality Date  . CHOLECYSTECTOMY    . CHOLECYSTECTOMY     The following portions of the patient's history were reviewed and updated as appropriate: allergies, current medications, past family history, past medical history, past social history, past surgical history and problem list.   Health Maintenance:  Normal pap and negative HRHPV on 11/25/2019.  Normal mammogram on 12/24/2019.   Review of Systems:  Pertinent items noted in HPI and remainder of comprehensive ROS otherwise negative.  Physical Exam:   General:  Alert, oriented and cooperative. Patient appears to be in no acute distress.  Mental Status: Normal mood and affect. Normal behavior. Normal judgment and thought content.   Respiratory: Normal respiratory effort, no problems with respiration noted  Rest of physical exam deferred due to type of encounter  Labs and  Imaging Results for orders placed or performed during the hospital encounter of 02/02/20 (from the past 336 hour(s))  SARS CORONAVIRUS 2 (TAT 6-24 HRS) Nasopharyngeal Nasopharyngeal Swab   Collection Time: 02/02/20  2:16 PM   Specimen: Nasopharyngeal Swab  Result Value Ref Range   SARS Coronavirus 2 NEGATIVE NEGATIVE   No results found.     Assessment and Plan:     Problem List Items Addressed This Visit      Unprioritized   Abnormal uterine bleeding (AUB)    For HTA with hysteroscopy. Has not been sterilized and is 70 and is not married. Discussed sterilization at length. She is not currently having sex. Continue Aygestin for now. Risks include but are not limited to bleeding, infection, injury to surrounding structures, including bowel, bladder and ureters, blood clots, and death.  Likelihood of success is high.               I discussed the assessment and treatment plan with the patient. The patient was provided an opportunity to ask questions and all were answered. The patient agreed with the plan and demonstrated an understanding of the instructions.   The patient was advised to call back or seek an in-person evaluation/go to the ED if the symptoms worsen or if the condition fails to improve as anticipated.  I provided 31 minutes of face-to-face time during this encounter.   Donnamae Jude, MD [facility], [place]

## 2020-02-05 NOTE — Progress Notes (Signed)
I connected with  Kaitlyn Wise on 02/05/20 at 10:35 AM EST by telephone and verified that I am speaking with the correct person using two identifiers.   I discussed the limitations, risks, security and privacy concerns of performing an evaluation and management service by telephone and the availability of in person appointments. I also discussed with the patient that there may be a patient responsible charge related to this service. The patient expressed understanding and agreed to proceed.  [name], RN 02/05/2020  10:43 AM  Used [place]  # [phone]  Pt does not have any BP cuff at home.

## 2020-02-09 NOTE — BH Specialist Note (Signed)
Integrated Behavioral Health via Telemedicine Visit  02/09/2020 Kaitlyn Wise [id number]  Number of [place] visits: 3 Session Start time: 10:55  Session End time: 11:25 Total time: 30  Referring Provider: Clayton Lefort, MD Patient/Family location: Home [facility] Provider location: [facility] [place] [facility]  All persons participating in visit: [name] and [place]   Types of Service: Telephone visit  I connected with Kaitlyn Wise and/or Kaitlyn Wise [phone] by Telephone  (Video is Care[employer] application) and verified that I am speaking with the correct person using two identifiers.Discussed confidentiality: Yes   I discussed the limitations of telemedicine and the availability of in person appointments.  Discussed there is a possibility of technology failure and discussed alternative modes of communication if that failure occurs.  I discussed that engaging in this telemedicine visit, they consent to the provision of behavioral healthcare and the services will be billed under their insurance.  Patient and/or legal guardian expressed understanding and consented to Telemedicine visit: Yes   Presenting Concerns: Patient and/or family reports the following symptoms/concerns: Pt states her primary concern today is having nightmares that wake her up 2-5 times per night, and ongoing difficulty with memory; pt began taking Effexor today, and has not noticed any side effects so far today.  Duration of problem: Ongoing; Severity of problem: moderately severe  Patient and/or Family's Strengths/Protective Factors: Social connections and [place] supports in place (healthy food, safe environments, etc.)  Goals Addressed: Patient will: 1.  Reduce symptoms of: anxiety, depression, insomnia and stress   Progress towards  Goals: Ongoing  Interventions: Interventions utilized:  Solution-Focused Strategies Standardized Assessments completed: Not Needed  Patient and/or Family Response: Pt agrees to revised treatment plan  Assessment: Patient currently experiencing Major depressive disorder, recurrent, severe, without psychotic features.   Patient may benefit from continued psychoeducation and brief therapeutic interventions regarding coping with symptoms of anxiety, depression, insomnia, stress .  Plan: 1. Follow up with behavioral health clinician on : Two weeks 2. Behavioral recommendations:  -Continue taking Effexor as prescribed by PCP -Set timer on phone as daily reminder to take Effexor at same time daily; consider using labelled pill box if needed, for additional daily reminder -Continue using nightly prayer ritual at night upon waking 3. Referral(s): [facility] (In Clinic)  I discussed the assessment and treatment plan with the patient and/or parent/guardian. They were provided an opportunity to ask questions and all were answered. They agreed with the plan and demonstrated an understanding of the instructions.   They were advised to call back or seek an in-person evaluation if the symptoms worsen or if the condition fails to improve as anticipated.  Garlan Fair, LCSW   Depression screen [facility] 2/9 02/15/2020 02/05/2020 01/20/2020 12/23/2019 07/08/2019  Decreased Interest 2 0 1 3 3   Down, Depressed, Hopeless 1 0 1 3 3   PHQ - 2 Score 3 0 2 6 6   Altered sleeping 2 0 3 3 3   Tired, decreased energy 2 1 3 2 3   Change in appetite 2 0 3 3 3   Feeling bad or failure about yourself  2 0 1 3 2   Trouble concentrating 2 0 2 3 2   Moving slowly or fidgety/restless 2 0 1 3 2   Suicidal thoughts 2 0 0 0 0  PHQ-9 Score 17 1 15 23 21   Difficult doing work/chores - - - - -  Some recent data  might be hidden   GAD 7 : Generalized Anxiety Score 02/15/2020 02/05/2020 01/20/2020 12/23/2019   Nervous, Anxious, on Edge 1 0 0 3  Control/stop worrying 1 0 1 2  Worry too much - different things 2 0 1 2  Trouble relaxing 2 0 1 2  Restless 2 0 0 0  Easily annoyed or irritable 2 0 0 3  Afraid - awful might happen 2 0 3 3  Total GAD 7 Score 12 0 6 15  Anxiety Difficulty - - - -

## 2020-02-15 ENCOUNTER — Encounter: Payer: Self-pay | Admitting: Family Medicine

## 2020-02-15 ENCOUNTER — Ambulatory Visit: Payer: 59 | Attending: Family Medicine | Admitting: Family Medicine

## 2020-02-15 ENCOUNTER — Other Ambulatory Visit: Payer: Self-pay

## 2020-02-15 ENCOUNTER — Other Ambulatory Visit: Payer: Self-pay | Admitting: Family Medicine

## 2020-02-15 VITALS — BP 108/64 | HR 76 | Ht 62.0 in | Wt 169.6 lb

## 2020-02-15 DIAGNOSIS — E038 Other specified hypothyroidism: Secondary | ICD-10-CM

## 2020-02-15 DIAGNOSIS — R110 Nausea: Secondary | ICD-10-CM

## 2020-02-15 DIAGNOSIS — Z9114 Patient's other noncompliance with medication regimen: Secondary | ICD-10-CM | POA: Diagnosis not present

## 2020-02-15 DIAGNOSIS — F322 Major depressive disorder, single episode, severe without psychotic features: Secondary | ICD-10-CM | POA: Diagnosis not present

## 2020-02-15 MED ORDER — VENLAFAXINE HCL ER 75 MG PO CP24: 75.0000 mg | ORAL_CAPSULE | Freq: Every day | ORAL | 3 refills | Status: DC

## 2020-02-15 MED ORDER — ONDANSETRON HCL 4 MG PO TABS: 4.0000 mg | ORAL_TABLET | Freq: Three times a day (TID) | ORAL | 0 refills | Status: DC | PRN

## 2020-02-15 MED ORDER — DICYCLOMINE HCL 10 MG PO CAPS: 10.0000 mg | ORAL_CAPSULE | Freq: Two times a day (BID) | ORAL | 2 refills | Status: DC | PRN

## 2020-02-15 MED ORDER — LEVOTHYROXINE SODIUM 150 MCG PO TABS: 150.0000 ug | ORAL_TABLET | Freq: Every day | ORAL | 3 refills | Status: DC

## 2020-02-15 MED FILL — ONDANSETRON HCL 4 MG TABLET: 4 | 6 days supply | Qty: 18 | Fill #0

## 2020-02-15 MED FILL — DICYCLOMINE 10 MG CAPSULE: 10 | 30 days supply | Qty: 60 | Fill #0

## 2020-02-15 MED FILL — VENLAFAXINE HCL ER 75 MG CA: 75 | 30 days supply | Qty: 30 | Fill #0

## 2020-02-15 MED FILL — LEVOTHYROXINE SODIUM 150 MC: 150 | 30 days supply | Qty: 30 | Fill #0

## 2020-02-15 NOTE — Patient Instructions (Signed)
Major Depressive Disorder, Adult Major depressive disorder is a mental health condition. This disorder affects feelings. It can also affect the body. Symptoms of this condition last most of the day, almost every day, for 2 weeks. This disorder can affect:  Relationships.  Daily activities, such as work and school.  Activities that you normally like to do. What are the causes? The cause of this condition is not known. The disorder is likely caused by a mix of things, including:  Your personality, such as being a shy person.  Your behavior, or how you act toward others.  Your thoughts and feelings.  Too much alcohol or drugs.  How you react to stress.  Health and mental problems that you have had for a long time.  Things that hurt you in the past (trauma).  Big changes in your life, such as divorce. What increases the risk? The following factors may make you more likely to develop this condition:  Having family members with depression.  Being a woman.  Problems in the family.  Low levels of some brain chemicals.  Things that caused you pain as a child, especially if you lost a parent or were abused.  A lot of stress in your life, such as from: ? Living without basic needs of life, such as food and shelter. ? Being treated poorly because of race, sex, or religion (discrimination).  Health and mental problems that you have had for a long time. What are the signs or symptoms? The main symptoms of this condition are:  Being sad all the time.  Being grouchy all the time.  Loss of interest in things and activities. Other symptoms include:  Sleeping too much or too little.  Eating too much or too little.  Gaining or losing weight, without knowing why.  Feeling tired or having low energy.  Being restless and weak.  Feeling hopeless, worthless, or guilty.  Trouble thinking clearly or making decisions.  Thoughts of hurting yourself or others, or thoughts of  ending your life.  Spending a lot of time alone.  Inability to complete common tasks of daily life. If you have very bad MDD, you may:  Believe things that are not true.  Hear, see, taste, or feel things that are not there.  Have mild depression that lasts for at least 2 years.  Feel very sad and hopeless.  Have trouble speaking or moving. How is this treated? This condition may be treated with:  Talk therapy. This teaches you to know bad thoughts, feelings, and actions and how to change them. ? This can also help you to communicate with others. ? This can be done with members of your family.  Medicines. These can be used to treat worry (anxiety), depression, or low levels of chemicals in the brain.  Lifestyle changes. You may need to: ? Limit alcohol use. ? Limit drug use. ? Get regular exercise. ? Get plenty of sleep. ? Make healthy eating choices. ? Spend more time outdoors.  Brain stimulation. This treatment excites the brain. This is done when symptoms are very bad or have not gotten better with other treatments. Follow these instructions at home: Activity  Get regular exercise as told.  Spend time outdoors as told.  Make time to do the things you enjoy.  Find ways to deal with stress. Try to: ? Meditate. ? Do deep breathing. ? Spend time in nature. ? Keep a journal.  Return to your normal activities as told by your doctor.   Ask your doctor what activities are safe for you. Alcohol and drug use  If you drink alcohol: ? Limit how much you use to:  0-1 drink a day for women.  0-2 drinks a day for men. ? Be aware of how much alcohol is in your drink. In the [place] one drink equals one 12 oz bottle of beer (355 mL), one 5 oz glass of wine (148 mL), or one 1 oz glass of hard liquor (44 mL).  Talk to your doctor about: ? Alcohol use. Alcohol can affect some medicines. ? Any drug use. General instructions  Take over-the-counter and prescription medicines  and herbal preparations only as told by your doctor.  Eat a healthy diet.  Get a lot of sleep.  Think about joining a support group. Your doctor may be able to suggest one.  Keep all follow-up visits as told by your doctor. This is important.   Where to find more information:  National Alliance on Mental Illness: www.nami.org  [place] National Institute of Mental Health: www.nimh.nih.gov  American Psychiatric Association: www.psychiatry.org/patients-families/ Contact a doctor if:  Your symptoms get worse.  You get new symptoms. Get help right away if:  You hurt yourself.  You have serious thoughts about hurting yourself or others.  You see, hear, taste, smell, or feel things that are not there. If you ever feel like you may hurt yourself or others, or have thoughts about taking your own life, get help right away. Go to your nearest emergency department or:  Call your local emergency services (911 in the [place]  Call a suicide crisis helpline, such as the National Suicide Prevention Lifeline at 1-800-273-8255. This is open 24 hours a day in the [place]  Text the Crisis Text Line at 741741 (in the [place] Summary  Major depressive disorder is a mental health condition. This disorder affects feelings. Symptoms of this condition last most of the day, almost every day, for 2 weeks.  The symptoms of this disorder can cause problems with relationships and with daily activities.  There are treatments and support for people who get this disorder. You may need more than one type of treatment.  Get help right away if you have serious thoughts about hurting yourself or others. This information is not intended to replace advice given to you by your health care provider. Make sure you discuss any questions you have with your health care provider. Document Revised: 12/13/2018 Document Reviewed: 12/13/2018 Elsevier Patient Education  2021 Elsevier Inc.  

## 2020-02-15 NOTE — Progress Notes (Signed)
Subjective:  Patient ID: Kaitlyn Wise, female    DOB: Jul 13, 1970  Age: 50 y.o. MRN: [id number]  CC: Hypothyroidism   HPI Kaitlyn Wise  is a 50 year old female with a history of hypothyroidism, Depressionwho presentstoday for a follow up visit.  She discontinued Prozac 3 days ago as she complained of nausea with it but discontinuing it has not brought about resolution of the nausea. She complains of ineffectiveness of Prozac while she was taking it. On further questioning even though I have been prescribing Pozac for over a year she informs me she had not been taking it until 3 weeks ago as she had abnormal uterine bleeding and was unsure which of her medications was responsible for this. She is currently followed by the LCSW at [facility]. For her abnormal uterine bleeding she is followed by GYN and is currently on Aygestin with plans for D&C/hysteroscopy with hydrothermal ablation on 04/06/2020.  She has had nausea and loss of appetite for the last 10 days. Denies presence of abdominal pain, diarrhea, constipation. States she ran out of her Levothyroxine 2 weeks ago. She has not called the Pharmacy for refill due to language barrier.  Past Medical History:  Diagnosis Date  . Dyslipidemia   . Thyroid disease   . Uterine fibroid   . Vertigo     Past Surgical History:  Procedure Laterality Date  . CHOLECYSTECTOMY    . CHOLECYSTECTOMY      Family History  Problem Relation Age of Onset  . Diabetes Mother   . Hypertension Mother   . Diabetes Father   . Hypertension Father   . Heart disease Brother     Allergies  Allergen Reactions  . Fish Allergy Itching  . Milk-Related Compounds Diarrhea and Itching    Outpatient Medications Prior to Visit  Medication Sig Dispense Refill  . cetirizine (ZYRTEC ALLERGY) 10 MG tablet Take 1 tablet (10 mg total) by mouth daily. 30 tablet 0  . fluticasone (FLONASE) 50 MCG/ACT nasal spray Place into the  nose.    . norethindrone (AYGESTIN) 5 MG tablet Take 3 tablets (15 mg total) by mouth daily. 2 tablets daily after bleeding stops x 1 week, then 1 tablet daily x 1 week 90 tablet 1  . dicyclomine (BENTYL) 10 MG capsule Take by mouth.    . levothyroxine (SYNTHROID) 100 MCG tablet Take 1.5 tablets (150 mcg total) by mouth daily. 45 tablet 3  . benzonatate (TESSALON) 100 MG capsule Take 1-2 capsules (100-200 mg total) by mouth 3 (three) times daily as needed for cough. (Patient not taking: Reported on 02/15/2020) 60 capsule 0  . promethazine-dextromethorphan (PROMETHAZINE-DM) 6.25-15 MG/5ML syrup Take 5 mLs by mouth at bedtime as needed for cough. (Patient not taking: Reported on 02/15/2020) 100 mL 0  . pseudoephedrine (SUDAFED) 60 MG tablet Take 1 tablet (60 mg total) by mouth every 8 (eight) hours as needed for congestion. (Patient not taking: Reported on 02/15/2020) 30 tablet 0  . FLUoxetine (PROZAC) 20 MG capsule Take 1 capsule (20 mg total) by mouth daily. (Patient not taking: No sig reported) 30 capsule 3   No facility-administered medications prior to visit.     ROS Review of Systems  Constitutional: Positive for appetite change. Negative for activity change and fatigue.  HENT: Negative for congestion, sinus pressure and sore throat.   Eyes: Negative for visual disturbance.  Respiratory: Negative for cough, chest tightness, shortness of breath and wheezing.   Cardiovascular: Negative for chest pain and  palpitations.  Gastrointestinal: Positive for nausea. Negative for abdominal distention, abdominal pain and constipation.  Endocrine: Negative for polydipsia.  Genitourinary: Negative for dysuria and frequency.  Musculoskeletal: Negative for arthralgias and back pain.  Skin: Negative for rash.  Neurological: Negative for tremors, light-headedness and numbness.  Hematological: Does not bruise/bleed easily.  Psychiatric/Behavioral: Positive for dysphoric mood. Negative for agitation and  behavioral problems.    Objective:  BP 108/64   Pulse 76   Ht 5\' 2"  (1.575 m)   Wt 169 lb 9.6 oz (76.9 kg)   LMP  (LMP Unknown)   SpO2 100%   BMI 31.02 kg/m   BP/Weight 02/15/2020 02/02/2020 86/07/6718  Systolic BP 947 096 283  Diastolic BP 64 63 80  Wt. (Lbs) 169.6 - 163.1  BMI 31.02 - 29.83  Some encounter information is confidential and restricted. Go to Review Flowsheets activity to see all data.      Physical Exam Constitutional:      Appearance: She is well-developed.  Neck:     Vascular: No JVD.  Cardiovascular:     Rate and Rhythm: Normal rate.     Heart sounds: Normal heart sounds. No murmur heard.   Pulmonary:     Effort: Pulmonary effort is normal.     Breath sounds: Normal breath sounds. No wheezing or rales.  Chest:     Chest wall: No tenderness.  Abdominal:     General: Bowel sounds are normal. There is no distension.     Palpations: Abdomen is soft. There is no mass.     Tenderness: There is no abdominal tenderness.  Musculoskeletal:        General: Normal range of motion.     Right lower leg: No edema.     Left lower leg: No edema.  Neurological:     Mental Status: She is alert and oriented to person, place, and time.  Psychiatric:     Comments: Dysphoric mood     CMP Latest Ref Rng & Units 10/15/2019 05/06/2019 09/05/2017  Glucose 70 - 99 mg/dL 89 99 101(H)  BUN 6 - 20 mg/dL 15 12 9   Creatinine 0.44 - 1.00 mg/dL 0.74 0.78 0.87  Sodium 135 - 145 mmol/L 136 136 137  Potassium 3.5 - 5.1 mmol/L 5.0 4.1 4.4  Chloride 98 - 111 mmol/L 103 102 105  CO2 22 - 32 mmol/L 24 22 25   Calcium 8.9 - 10.3 mg/dL 8.9 9.1 9.0  Total Protein 6.5 - 8.1 g/dL 7.0 7.1 7.7  Total Bilirubin 0.3 - 1.2 mg/dL 0.5 <0.2 0.5  Alkaline Phos 38 - 126 U/L 69 129(H) 106  AST 15 - 41 U/L 21 22 23   ALT 0 - 44 U/L 25 12 16     Lipid Panel     Component Value Date/Time   CHOL 187 09/23/2019 1043   TRIG 53 09/23/2019 1043   HDL 35 (L) 09/23/2019 1043   CHOLHDL 5.3 (H)  09/23/2019 1043   CHOLHDL 2.9 08/25/2015 1111   VLDL 17 08/25/2015 1111   LDLCALC 142 (H) 09/23/2019 1043    CBC    Component Value Date/Time   WBC 5.3 10/15/2019 1555   RBC 4.20 10/15/2019 1555   HGB 10.8 (L) 10/15/2019 1555   HGB 11.3 09/23/2019 1043   HCT 36.2 10/15/2019 1555   HCT 36.9 09/23/2019 1043   PLT 292 10/15/2019 1555   PLT 284 09/23/2019 1043   MCV 86.2 10/15/2019 1555   MCV 86 09/23/2019 1043   MCH 25.7 (  L) 10/15/2019 1555   MCHC 29.8 (L) 10/15/2019 1555   RDW 17.0 (H) 10/15/2019 1555   RDW 17.0 (H) 09/23/2019 1043   LYMPHSABS 1.8 10/15/2019 1555   LYMPHSABS 1.7 09/23/2019 1043   MONOABS 0.4 10/15/2019 1555   EOSABS 0.3 10/15/2019 1555   EOSABS 0.4 09/23/2019 1043   BASOSABS 0.1 10/15/2019 1555   BASOSABS 0.1 09/23/2019 1043    No results found for: HGBA1C  Depression screen [facility] 2/9 02/15/2020 02/05/2020 01/20/2020  Decreased Interest 2 0 1  Down, Depressed, Hopeless 1 0 1  PHQ - 2 Score 3 0 2  Altered sleeping 2 0 3  Tired, decreased energy 2 1 3   Change in appetite 2 0 3  Feeling bad or failure about yourself  2 0 1  Trouble concentrating 2 0 2  Moving slowly or fidgety/restless 2 0 1  Suicidal thoughts 2 0 0  PHQ-9 Score 17 1 15   Difficult doing work/chores - - -  Some recent data might be hidden    Assessment & Plan:  1. Other specified hypothyroidism Uncontrolled due to noncompliance I will not adjust her regimen if labs are abnormal given she has not been compliant with levothyroxine She has been advised that if she has difficulty obtaining an interpreter to call the clinic for refills she can come into the office to request refills in person - T4, free - TSH - Basic Metabolic Panel - levothyroxine (SYNTHROID) 150 MCG tablet; Take 1 tablet (150 mcg total) by mouth daily.  Dispense: 30 tablet; Refill: 3  2. Current severe episode of major depressive disorder without psychotic features without prior episode ([place]) Uncontrolled Not compliant  with Prozac I have discussed onset of actions of SSRI of 3 to 4 weeks with her and she had been on Prozac for just 3 weeks She is of the opinion that Prozac caused her nausea hence I will switch her to venlafaxine Continue psychotherapy with LCSW at [facility] - venlafaxine XR (EFFEXOR XR) 75 MG 24 hr capsule; Take 1 capsule (75 mg total) by mouth daily with breakfast.  Dispense: 30 capsule; Refill: 3  3. Nausea Uncontrolled Aygestin could be contributory-she has been advised to discuss this with GYN - ondansetron (ZOFRAN) 4 MG tablet; Take 1 tablet (4 mg total) by mouth every 8 (eight) hours as needed for nausea or vomiting.  Dispense: 20 tablet; Refill: 0  4. Non compliance w medication regimen Her noncompliance is secondary to language barrier She has also held off on taking some of her medications due to the misconception of her medications causing her abnormal uterine bleed.    Meds ordered this encounter  Medications  . levothyroxine (SYNTHROID) 150 MCG tablet    Sig: Take 1 tablet (150 mcg total) by mouth daily.    Dispense:  30 tablet    Refill:  3  . venlafaxine XR (EFFEXOR XR) 75 MG 24 hr capsule    Sig: Take 1 capsule (75 mg total) by mouth daily with breakfast.    Dispense:  30 capsule    Refill:  3    Discontinue Prozac  . dicyclomine (BENTYL) 10 MG capsule    Sig: Take 1 capsule (10 mg total) by mouth 2 (two) times daily as needed for spasms.    Dispense:  60 capsule    Refill:  2  . ondansetron (ZOFRAN) 4 MG tablet    Sig: Take 1 tablet (4 mg total) by mouth every 8 (eight) hours as needed for nausea  or vomiting.    Dispense:  20 tablet    Refill:  0    Follow-up: Return in about 3 months (around 05/14/2020) for medical conditions.       Charlott Rakes, MD, FAAFP. [facility] [place], [place]   02/15/2020, 10:43 AM

## 2020-02-15 NOTE — Progress Notes (Signed)
Depression medication makes her nauseated.

## 2020-02-16 LAB — T4, FREE: Free T4: 0.54 ng/dL — ABNORMAL LOW (ref 0.82–1.77)

## 2020-02-16 LAB — BASIC METABOLIC PANEL
BUN/Creatinine Ratio: 13 (ref 9–23)
BUN: 13 mg/dL (ref 6–24)
CO2: 22 mmol/L (ref 20–29)
Calcium: 9.3 mg/dL (ref 8.7–10.2)
Chloride: 106 mmol/L (ref 96–106)
Creatinine, Ser: 0.99 mg/dL (ref 0.57–1.00)
GFR calc Af Amer: 78 mL/min/{1.73_m2} (ref >59)
GFR calc non Af Amer: 68 mL/min/{1.73_m2} (ref >59)
Glucose: 89 mg/dL (ref 65–99)
Potassium: 4.7 mmol/L (ref 3.5–5.2)
Sodium: 141 mmol/L (ref 134–144)

## 2020-02-16 LAB — TSH: TSH: 28.7 u[IU]/mL — ABNORMAL HIGH (ref 0.450–4.500)

## 2020-02-17 ENCOUNTER — Ambulatory Visit (INDEPENDENT_AMBULATORY_CARE_PROVIDER_SITE_OTHER): Payer: 59 | Admitting: Clinical

## 2020-02-17 DIAGNOSIS — Z1331 Encounter for screening for depression: Secondary | ICD-10-CM | POA: Diagnosis not present

## 2020-02-17 DIAGNOSIS — F332 Major depressive disorder, recurrent severe without psychotic features: Secondary | ICD-10-CM

## 2020-02-18 ENCOUNTER — Telehealth: Payer: Self-pay | Admitting: Pharmacist

## 2020-02-18 NOTE — Telephone Encounter (Signed)
Received notice from pharmacy that pt's NDC of levothyroxine was changed. Will forward information to PCP.

## 2020-02-19 ENCOUNTER — Telehealth: Payer: Self-pay | Admitting: Clinical

## 2020-02-19 NOTE — Telephone Encounter (Signed)
Intern called Pt to reschedule for in person visit. Pt is rescheduled from 03/02/20 to 03/09/20 at 9:45AM. Pt stated she will need an interpreter. Pt was provided phone number.  Gertha Calkin (Supervisor: Vesta Mixer)

## 2020-02-24 ENCOUNTER — Telehealth: Payer: Self-pay | Admitting: Clinical

## 2020-02-25 NOTE — Progress Notes (Signed)
Patient has viewed results via mychart on 02/17/20

## 2020-02-29 MED FILL — NORETHINDRONE 5 MG TABLET: 5 | 30 days supply | Qty: 90 | Fill #0

## 2020-03-01 NOTE — BH Specialist Note (Deleted)
Integrated Behavioral Health Follow Up In-Person Visit  MRN: [id number] Name: Kaitlyn Wise  Number of [place] Clinician visits: {IBH Number of Visits:21014052} Session Start time: ***  Session End time: *** Total time: {IBH Total Time:21014050} minutes  Types of Service: {CHL AMB TYPE OF [phone]}  Interpretor:{yes VO:[id number]} Interpretor Name and Language: ***  Subjective: Kaitlyn Wise is a 50 y.o. female accompanied by {Patient accompanied [phone]} Patient was referred by *** for ***. Patient reports the following symptoms/concerns: *** Duration of problem: ***; Severity of problem: {Mild/Moderate/Severe:20260}  Objective: Mood: {BHH MOOD:22306} and Affect: {BHH AFFECT:22307} Risk of harm to self or others: {CHL AMB BH Suicide Current Mental Status:21022748}  Life Context: Family and Social: *** School/Work: *** Self-Care: *** Life Changes: ***  Patient and/or Family's Strengths/Protective Factors: {CHL AMB BH PROTECTIVE [phone]}  Goals Addressed: Patient will: 1.  Reduce symptoms of: {IBH Symptoms:21014056}  2.  Increase knowledge and/or ability of: {IBH Patient Tools:21014057}  3.  Demonstrate ability to: {IBH Goals:21014053}  Progress towards Goals: {CHL AMB BH PROGRESS TOWARDS GOALS:[phone]}  Interventions: Interventions utilized:  {IBH Interventions:21014054} Standardized Assessments completed: {IBH Screening Tools:21014051}  Patient and/or Family Response: ***  Patient Centered Plan: Patient is on the following Treatment Plan(s): *** Assessment: Patient currently experiencing ***.   Patient may benefit from ***.  Plan: 1. Follow up with behavioral health clinician on : *** 2. Behavioral recommendations: *** 3. Referral(s): {IBH Referrals:21014055} 4. "From scale of 1-10, how likely are you to follow plan?": ***  Caroleen Hamman Mykell Rawl, LCSW

## 2020-03-03 NOTE — Telephone Encounter (Signed)
Error

## 2020-03-11 ENCOUNTER — Telehealth: Payer: Self-pay | Admitting: Clinical

## 2020-03-11 NOTE — Telephone Encounter (Signed)
Intern called Pt using Interpreter Shanon Brow [phone]) regarding scheduling appt. Pt stated she cancelled last appt due to having a conflicting appt. Pt is rescheduled for 03/25/2020 at 10:15AM. Pt received call back number.  Gertha Calkin (Supervisor: Vesta Mixer)

## 2020-03-12 MED FILL — LEVOTHYROXINE SODIUM 150 MC: 150 | 30 days supply | Qty: 30 | Fill #1

## 2020-03-16 ENCOUNTER — Telehealth: Payer: Self-pay | Admitting: Clinical

## 2020-03-16 NOTE — Telephone Encounter (Signed)
Tried to call patient to make sure they were aware their upcoming appointment was in person. HIPAA compliant oicemail left to call back

## 2020-03-17 ENCOUNTER — Ambulatory Visit: Payer: 59 | Admitting: Family Medicine

## 2020-03-21 NOTE — H&P (Signed)
Kaitlyn Wise is an 50 y.o. G1P1 female.   Chief Complaint: abnormal bleeding HPI: has h/o abnormal bleeding. EMB showed benign polyp. U/s shows possible mass.  Past Medical History:  Diagnosis Date  . Dyslipidemia   . Thyroid disease   . Uterine fibroid   . Vertigo     Past Surgical History:  Procedure Laterality Date  . CHOLECYSTECTOMY    . CHOLECYSTECTOMY      Family History  Problem Relation Age of Onset  . Diabetes Mother   . Hypertension Mother   . Diabetes Father   . Hypertension Father   . Heart disease Brother    Social History:  reports that she has never smoked. She has never used smokeless tobacco. She reports that she does not drink alcohol and does not use drugs.  Allergies:  Allergies  Allergen Reactions  . Fish Allergy Itching  . Milk-Related Compounds Diarrhea and Itching    No medications prior to admission.    A comprehensive review of systems was negative.  There were no vitals taken for this visit. General appearance: alert, cooperative and appears stated age Head: Normocephalic, without obvious abnormality, atraumatic Neck: supple, symmetrical, trachea midline Lungs: normal effort Heart: regular rate and rhythm Abdomen: soft, non-tender; bowel sounds normal; no masses,  no organomegaly Extremities: Homans sign is negative, no sign of DVT Skin: Skin color, texture, turgor normal. No rashes or lesions Neurologic: Grossly normal   Lab Results  Component Value Date   WBC 5.3 10/15/2019   HGB 10.8 (L) 10/15/2019   HCT 36.2 10/15/2019   MCV 86.2 10/15/2019   PLT 292 10/15/2019   Lab Results  Component Value Date   PREGTESTUR NEGATIVE 11/25/2019   HCG <5.0 09/05/2017     Assessment/Plan Active Problems:   Abnormal uterine bleeding (AUB)  For D & C with Hysteroscopy and HTA Risks include but are not limited to bleeding, infection, injury to surrounding structures, including bowel, bladder and ureters, blood clots,  and death.  Likelihood of success is high.    Donnamae Jude 03/21/2020, 1:43 PM

## 2020-03-25 ENCOUNTER — Ambulatory Visit: Payer: Self-pay

## 2020-04-06 ENCOUNTER — Ambulatory Visit (HOSPITAL_BASED_OUTPATIENT_CLINIC_OR_DEPARTMENT_OTHER): Admission: RE | Admit: 2020-04-06 | Payer: 59 | Source: Home / Self Care | Admitting: Family Medicine

## 2020-04-06 ENCOUNTER — Telehealth: Payer: Self-pay | Admitting: Clinical

## 2020-04-06 ENCOUNTER — Encounter (HOSPITAL_BASED_OUTPATIENT_CLINIC_OR_DEPARTMENT_OTHER): Admission: RE | Payer: Self-pay | Source: Home / Self Care

## 2020-04-06 DIAGNOSIS — N939 Abnormal uterine and vaginal bleeding, unspecified: Secondary | ICD-10-CM | POA: Diagnosis present

## 2020-04-06 SURGERY — DILATATION & CURETTAGE/HYSTEROSCOPY WITH HYDROTHERMAL ABLATION
Anesthesia: Choice

## 2020-04-06 NOTE — Telephone Encounter (Signed)
Intern called Pt with interpreter [place] [phone]) to schedule Pt for memory test. Interpreter left message regarding scheduling appt with the [facility] clinician and provided a call back number.  Gertha Calkin (Supervisor: Vesta Mixer)

## 2020-04-13 MED FILL — LEVOTHYROXINE SODIUM 150 MC: 150 | 30 days supply | Qty: 30 | Fill #2

## 2020-04-16 ENCOUNTER — Other Ambulatory Visit (HOSPITAL_COMMUNITY): Payer: Self-pay

## 2020-05-06 ENCOUNTER — Other Ambulatory Visit (HOSPITAL_COMMUNITY): Payer: Self-pay

## 2020-05-06 MED FILL — Dicyclomine HCl Cap 10 MG: ORAL | 30 days supply | Qty: 60 | Fill #0 | Status: AC

## 2020-05-08 MED FILL — Levothyroxine Sodium Tab 150 MCG: ORAL | 30 days supply | Qty: 30 | Fill #0 | Status: CN

## 2020-05-09 ENCOUNTER — Other Ambulatory Visit: Payer: Self-pay | Admitting: Family Medicine

## 2020-05-09 ENCOUNTER — Other Ambulatory Visit (HOSPITAL_COMMUNITY): Payer: Self-pay

## 2020-05-09 DIAGNOSIS — E038 Other specified hypothyroidism: Secondary | ICD-10-CM

## 2020-05-09 MED ORDER — LEVOTHYROXINE SODIUM 150 MCG PO TABS
150.0000 ug | ORAL_TABLET | Freq: Every day | ORAL | 3 refills | Status: DC
  Filled 2020-05-09: qty 30, 30d supply, fill #0
  Filled 2020-06-06: qty 30, 30d supply, fill #1
  Filled 2020-07-11: qty 30, 30d supply, fill #2
  Filled 2020-08-10: qty 30, 30d supply, fill #3

## 2020-05-09 NOTE — Telephone Encounter (Signed)
Requested Prescriptions  Pending Prescriptions Disp Refills  . levothyroxine (SYNTHROID) 150 MCG tablet 30 tablet 3    Sig: TAKE 1 TABLET (150 MCG TOTAL) BY MOUTH DAILY.     Endocrinology:  Hypothyroid Agents Failed - 05/09/2020  7:41 AM      Failed - TSH needs to be rechecked within 3 months after an abnormal result. Refill until TSH is due.      Failed - TSH in normal range and within 360 days    TSH  Date Value Ref Range Status  02/15/2020 28.700 (H) 0.450 - 4.500 uIU/mL Final         Passed - Valid encounter within last 12 months    Recent Outpatient Visits          2 months ago Current severe episode of major depressive disorder without psychotic features without prior episode [facility])   [place], Charlane Ferretti, MD   7 months ago Other specified hypothyroidism   [place], Levada Dy M, [place]   10 months ago Annual physical exam   [place], [place], [place]   11 months ago Current severe episode of major depressive disorder without psychotic features without prior episode [facility])   [place], [place] D, LCSW   1 year ago Current severe episode of major depressive disorder without psychotic features without prior episode [facility])   [facility] Charlott Rakes, MD

## 2020-05-21 ENCOUNTER — Other Ambulatory Visit: Payer: Self-pay

## 2020-05-21 ENCOUNTER — Emergency Department (HOSPITAL_COMMUNITY): Payer: 59

## 2020-05-21 ENCOUNTER — Encounter (HOSPITAL_COMMUNITY): Payer: Self-pay

## 2020-05-21 ENCOUNTER — Emergency Department (HOSPITAL_COMMUNITY)
Admission: EM | Admit: 2020-05-21 | Discharge: 2020-05-21 | Disposition: A | Discharge status: Home / Self Care | Payer: 59 | Attending: Emergency Medicine | Admitting: Emergency Medicine

## 2020-05-21 ENCOUNTER — Ambulatory Visit (HOSPITAL_COMMUNITY)
Admission: EM | Admit: 2020-05-21 | Discharge: 2020-05-21 | Disposition: A | Discharge status: Home / Self Care | Payer: 59 | Attending: Physician Assistant | Admitting: Physician Assistant

## 2020-05-21 DIAGNOSIS — E039 Hypothyroidism, unspecified: Secondary | ICD-10-CM | POA: Diagnosis not present

## 2020-05-21 DIAGNOSIS — I959 Hypotension, unspecified: Secondary | ICD-10-CM

## 2020-05-21 DIAGNOSIS — R112 Nausea with vomiting, unspecified: Secondary | ICD-10-CM | POA: Diagnosis not present

## 2020-05-21 DIAGNOSIS — U071 COVID-19: Secondary | ICD-10-CM | POA: Diagnosis not present

## 2020-05-21 DIAGNOSIS — Z28310 Unvaccinated for covid-19: Secondary | ICD-10-CM | POA: Insufficient documentation

## 2020-05-21 DIAGNOSIS — R42 Dizziness and giddiness: Secondary | ICD-10-CM

## 2020-05-21 DIAGNOSIS — R509 Fever, unspecified: Secondary | ICD-10-CM | POA: Diagnosis present

## 2020-05-21 LAB — CBC
HCT: 38.5 % (ref 36.0–46.0)
Hemoglobin: 11.6 g/dL — ABNORMAL LOW (ref 12.0–15.0)
MCH: 22.4 pg — ABNORMAL LOW (ref 26.0–34.0)
MCHC: 30.1 g/dL (ref 30.0–36.0)
MCV: 74.2 fL — ABNORMAL LOW (ref 80.0–100.0)
Platelets: 121 10*3/uL — ABNORMAL LOW (ref 150–400)
RBC: 5.19 MIL/uL — ABNORMAL HIGH (ref 3.87–5.11)
RDW: 18.1 % — ABNORMAL HIGH (ref 11.5–15.5)
WBC: 2.4 10*3/uL — ABNORMAL LOW (ref 4.0–10.5)
nRBC: 0 % (ref 0.0–0.2)

## 2020-05-21 LAB — TROPONIN I (HIGH SENSITIVITY): Troponin I (High Sensitivity): 3 ng/L (ref <18)

## 2020-05-21 LAB — RESP PANEL BY RT-PCR (FLU A&B, COVID) ARPGX2
Influenza A by PCR: NEGATIVE
Influenza B by PCR: NEGATIVE
SARS Coronavirus 2 by RT PCR: POSITIVE — AB

## 2020-05-21 NOTE — ED Provider Notes (Signed)
[facility] EMERGENCY DEPARTMENT Provider Note   CSN: [id number] Arrival date & time: 05/21/20  1200     History Chief Complaint  Patient presents with  . Shortness of Breath  . Fever    Kaitlyn Wise is a 50 y.o. female.  HPI She presents after brief evaluation of the urgent care for evaluation of illness for 5 days, consisting of cough, fever, nausea and vomiting.  At the urgent care she was hypotensive, since arrival to the ED, her blood pressure has been normal.  She is tachypneic on arrival.  She complains of illness for 4 days, she complains of headache, fever, chills, sniffling, decreased appetite and this.  She denies paresthesia or focal weakness.  She has not had COVID vaccines.  She is currently employed.  There are no other known active modifying factors.    Past Medical History:  Diagnosis Date  . Dyslipidemia   . Thyroid disease   . Uterine fibroid   . Vertigo     Patient Active Problem List   Diagnosis Date Noted  . Abnormal uterine bleeding (AUB) 11/25/2019  . Chest pain in adult 08/01/2017  . Costochondritis 08/01/2017  . Current severe episode of major depressive disorder without psychotic features without prior episode ([place]) 08/01/2017  . Mild episode of recurrent major depressive disorder ([place]) 01/18/2017  . Migraine 12/05/2015  . Gastritis and gastroduodenitis 03/09/2015  . Dyslipidemia 06/16/2013  . Hypothyroidism 05/21/2013    Past Surgical History:  Procedure Laterality Date  . CHOLECYSTECTOMY    . CHOLECYSTECTOMY       OB History    Gravida  1   Para  1   Term      Preterm      AB      Living  1     SAB      IAB      Ectopic      Multiple      Live Births  1           Family History  Problem Relation Age of Onset  . Diabetes Mother   . Hypertension Mother   . Diabetes Father   . Hypertension Father   . Heart disease Brother     Social History   Tobacco Use  . Smoking  status: Never Smoker  . Smokeless tobacco: Never Used  Vaping Use  . Vaping Use: Never used  Substance Use Topics  . Alcohol use: No  . Drug use: No    Home Medications Prior to Admission medications   Medication Sig Start Date End Date Taking? Authorizing Provider  benzonatate (TESSALON) 100 MG capsule TAKE 1-2 CAPSULES (100-200 MG TOTAL) BY MOUTH 3 (THREE) TIMES DAILY AS NEEDED FOR COUGH. Patient not taking: Reported on 02/15/2020 02/02/20 02/01/21  Jaynee Eagles, PA-C  cetirizine (ZYRTEC) 10 MG tablet TAKE 1 TABLET (10 MG TOTAL) BY MOUTH DAILY. 02/02/20 02/01/21  Jaynee Eagles, PA-C  dicyclomine (BENTYL) 10 MG capsule TAKE 1 CAPSULE (10 MG TOTAL) BY MOUTH 2 (TWO) TIMES DAILY AS NEEDED FOR SPASMS. 02/15/20 02/14/21  Charlott Rakes, MD  fluticasone (FLONASE) 50 MCG/ACT nasal spray Place into the nose. 04/14/19   [provider]  levothyroxine (SYNTHROID) 150 MCG tablet Take 1 tablet (150 mcg total) by mouth daily. 05/09/20   Charlott Rakes, MD  norethindrone (AYGESTIN) 5 MG tablet Take 3 tablets (15 mg total) by mouth daily. 2 tablets daily after bleeding stops x 1 week, then 1 tablet daily  x 1 week 12/09/19   Donnamae Jude, MD  ondansetron (ZOFRAN) 4 MG tablet TAKE 1 TABLET (4 MG TOTAL) BY MOUTH EVERY 8 (EIGHT) HOURS AS NEEDED FOR NAUSEA OR VOMITING. 02/15/20 02/14/21  Charlott Rakes, MD  promethazine-dextromethorphan (PROMETHAZINE-DM) 6.25-15 MG/5ML syrup TAKE 5 MLS BY MOUTH AT BEDTIME AS NEEDED FOR COUGH. Patient not taking: Reported on 02/15/2020 02/02/20 02/01/21  Jaynee Eagles, PA-C  pseudoephedrine (SUDAFED) 60 MG tablet TAKE 1 TABLET (60 MG TOTAL) BY MOUTH EVERY 8 (EIGHT) HOURS AS NEEDED FOR CONGESTION. Patient not taking: Reported on 02/15/2020 02/02/20 02/01/21  Jaynee Eagles, PA-C  venlafaxine XR (EFFEXOR-XR) 75 MG 24 hr capsule TAKE 1 CAPSULE (75 MG TOTAL) BY MOUTH DAILY WITH BREAKFAST. 02/15/20 02/14/21  Charlott Rakes, MD  FLUoxetine (PROZAC) 20 MG capsule Take 1 capsule (20 mg total) by  mouth daily. Patient not taking: No sig reported 12/30/19 02/15/20  Charlott Rakes, MD  medroxyPROGESTERone (PROVERA) 10 MG tablet Take 1 tablet (10 mg total) by mouth daily. 09/04/19 09/08/19  Charlott Rakes, MD  prazosin (MINIPRESS) 1 MG capsule Take 1 capsule (1 mg total) by mouth at bedtime. 10/22/17 06/03/19  Charlott Rakes, MD  topiramate (TOPAMAX) 50 MG tablet Take 1 tablet (50 mg total) by mouth 2 (two) times daily. Patient not taking: Reported on 04/14/2016 11/29/15 06/03/19  Charlott Rakes, MD    Allergies    Fish allergy and Milk-related compounds  Review of Systems   Review of Systems  All other systems reviewed and are negative.   Physical Exam Updated Vital Signs BP 113/74   Pulse 63   Temp 98.3 F (36.8 C) (Oral)   Resp 18   LMP  (LMP Unknown)   SpO2 99%   Physical Exam Vitals and nursing note reviewed.  Constitutional:      General: She is not in acute distress.    Appearance: She is well-developed. She is not ill-appearing or diaphoretic.  HENT:     Head: Normocephalic and atraumatic.     Right Ear: External ear normal.     Left Ear: External ear normal.  Eyes:     Conjunctiva/sclera: Conjunctivae normal.     Pupils: Pupils are equal, round, and reactive to light.  Neck:     Trachea: Phonation normal.  Cardiovascular:     Rate and Rhythm: Normal rate.  Pulmonary:     Effort: Pulmonary effort is normal.  Abdominal:     General: There is no distension.  Musculoskeletal:        General: Normal range of motion.     Cervical back: Normal range of motion and neck supple.  Skin:    General: Skin is warm and dry.  Neurological:     Mental Status: She is alert and oriented to person, place, and time.     Cranial Nerves: No cranial nerve deficit.     Sensory: No sensory deficit.     Motor: No abnormal muscle tone.     Coordination: Coordination normal.  Psychiatric:        Mood and Affect: Mood normal.        Behavior: Behavior normal.        Thought  Content: Thought content normal.        Judgment: Judgment normal.     ED Results / Procedures / Treatments   Labs (all labs ordered are listed, but only abnormal results are displayed) Labs Reviewed  RESP PANEL BY RT-PCR (FLU A&B, COVID) ARPGX2 - Abnormal; Notable for the following  components:      Result Value   SARS Coronavirus 2 by RT PCR POSITIVE (*)    All other components within normal limits  CBC - Abnormal; Notable for the following components:   WBC 2.4 (*)    RBC 5.19 (*)    Hemoglobin 11.6 (*)    MCV 74.2 (*)    MCH 22.4 (*)    RDW 18.1 (*)    Platelets 121 (*)    All other components within normal limits  COMPREHENSIVE METABOLIC PANEL  TROPONIN I (HIGH SENSITIVITY)  TROPONIN I (HIGH SENSITIVITY)    EKG EKG Interpretation  Date/Time:  Saturday May 21 2020 12:20:05 EDT Ventricular Rate:  70 PR Interval:  133 QRS Duration: 93 QT Interval:  386 QTC Calculation: 417 R Axis:   66 Text Interpretation: Sinus rhythm Low voltage, precordial leads since last tracing no significant change Confirmed by Daleen Bo [id number]) on 05/21/2020 1:58:55 PM   Radiology DG Chest Port 1 View  Result Date: 05/21/2020 CLINICAL DATA:  Chest pain, fever and body aches for 4 days. EXAM: PORTABLE CHEST 1 VIEW COMPARISON:  Radiograph 08/01/2017 and 11/09/2015. FINDINGS: 1243 hours. The heart size and mediastinal contours are normal. The lungs are clear. There is no pleural effusion or pneumothorax. No acute osseous findings are identified. Telemetry leads overlie the chest. Cholecystectomy clips are noted. IMPRESSION: Stable chest.  No active cardiopulmonary process. Electronically Signed   By: Richardean Sale M.D.   On: 05/21/2020 13:09    Procedures Procedures   Medications Ordered in ED Medications - No data to display  ED Course  I have reviewed the triage vital signs and the nursing notes.  Pertinent labs & imaging results that were available during my care of the patient were  reviewed by me and considered in my medical decision making (see chart for details).    MDM Rules/Calculators/A&P                           Patient Vitals for the past 24 hrs:  BP Temp Temp src Pulse Resp SpO2  05/21/20 1400 113/74 -- -- 63 18 99 %  05/21/20 1345 116/72 -- -- 63 18 100 %  05/21/20 1330 96/69 -- -- 64 (!) 29 100 %  05/21/20 1315 106/69 -- -- 67 20 100 %  05/21/20 1221 -- 98.3 F (36.8 C) Oral -- -- --  05/21/20 1216 -- -- -- -- -- 100 %  05/21/20 1215 107/64 -- -- 60 (!) 24 100 %    4:20 PM Reevaluation with update and discussion. After initial assessment and treatment, an updated evaluation reveals no change in clinical status, findings discussed questions and. Daleen Bo   Medical Decision Making:  This patient is presenting for evaluation of 5-day illness, which does require a range of treatment options, and is a complaint that involves a moderate risk of morbidity and mortality. The differential diagnoses include COVID infection, flu infection, malaise, nonspecific symptoms. I decided to review old records, and in summary healthy young female presenting for evaluation of febrile illness for 5 days..  I did not require additional historical information from anyone.  Clinical Laboratory Tests Ordered, included CBC, Metabolic panel and Viral panel, troponin. Review indicates normal except COVID-positive, leukopenia, mild anemia, low MCV. Radiologic Tests Ordered, included chest x-ray.  I independently Visualized: Radiographic images, which show no acute abnormality    Critical Interventions-clinical evaluation, laboratory testing, chest x-ray, observation reassess  After  These Interventions, the Patient was reevaluated and was found able for discharge.  She has uncomplicated COVID infection that is causing her symptoms she does not meet criteria for hospitalization.  She also does not meet criteria for oral antiviral medication.  CRITICAL CARE-no Performed  by: Daleen Bo  Nursing Notes Reviewed/ Care Coordinated Applicable Imaging Reviewed Interpretation of Laboratory Data incorporated into ED treatment  The patient appears reasonably screened and/or stabilized for discharge and I doubt any other medical condition or other [facility] requiring further screening, evaluation, or treatment in the ED at this time prior to discharge.  Plan: Home Medications-continue usual; Home Treatments-symptomatic as needed; return here if the recommended treatment, does not improve the symptoms; Recommended follow up-PCP,.     Final Clinical Impression(s) / ED Diagnoses Final diagnoses:  [id number]    Rx / DC Orders ED Discharge Orders    None       Daleen Bo, MD 05/21/20 1622

## 2020-05-21 NOTE — ED Notes (Signed)
Patient is being discharged from the Urgent Care and sent to the Emergency Department via EMS . Per Junie Panning Raspet-PA, patient is in need of higher level of care due to hypotension. Patient is aware and verbalizes understanding of plan of care.  Vitals:   05/21/20 1104 05/21/20 1107  BP: (!) 89/68 (!) 81/42  Pulse:    Resp: 18 (!) 30  Temp: 98.1 F (36.7 C)   SpO2:

## 2020-05-21 NOTE — ED Triage Notes (Signed)
Pt arrived to ED via EMS from Urgent care. Pt with c/o cough, fever, nausea and vomiting for 4 days. Pt was sent from UC as pt reported chest pain and hypotension. Upon EMS arrival pt bp was 110/80, pt c/o generalized body aches, cough, fever. Pt is alert and oriented x4, no resp distress noted and vital sx wnl

## 2020-05-21 NOTE — ED Provider Notes (Signed)
[facility]    CSN: [id number] Arrival date & time: 05/21/20  1019      History   Chief Complaint Chief Complaint  Patient presents with  . Fever  . Fatigue  . Headache  . Chest Pain    HPI Kaitlyn Wise is a 50 y.o. female.   I was called in to evaluate patient in triage due to abnormal vital signs.  Patient reported that she had been sick for the past 4 days and has had very little oral intake as a result of headache and nausea.  She is on any medication for symptom management.  Over the past 24 hours she has had worsening symptoms and is now experiencing shortness of breath and chest discomfort as well as lightheadedness particularly with standing.     Past Medical History:  Diagnosis Date  . Dyslipidemia   . Thyroid disease   . Uterine fibroid   . Vertigo     Patient Active Problem List   Diagnosis Date Noted  . Abnormal uterine bleeding (AUB) 11/25/2019  . Chest pain in adult 08/01/2017  . Costochondritis 08/01/2017  . Current severe episode of major depressive disorder without psychotic features without prior episode ([place]) 08/01/2017  . Mild episode of recurrent major depressive disorder ([place]) 01/18/2017  . Migraine 12/05/2015  . Gastritis and gastroduodenitis 03/09/2015  . Dyslipidemia 06/16/2013  . Hypothyroidism 05/21/2013    Past Surgical History:  Procedure Laterality Date  . CHOLECYSTECTOMY    . CHOLECYSTECTOMY      OB History    Gravida  1   Para  1   Term      Preterm      AB      Living  1     SAB      IAB      Ectopic      Multiple      Live Births  1            Home Medications    Prior to Admission medications   Medication Sig Start Date End Date Taking? Authorizing Provider  benzonatate (TESSALON) 100 MG capsule TAKE 1-2 CAPSULES (100-200 MG TOTAL) BY MOUTH 3 (THREE) TIMES DAILY AS NEEDED FOR COUGH. Patient not taking: Reported on 02/15/2020 02/02/20 02/01/21  Jaynee Eagles, PA-C   cetirizine (ZYRTEC) 10 MG tablet TAKE 1 TABLET (10 MG TOTAL) BY MOUTH DAILY. 02/02/20 02/01/21  Jaynee Eagles, PA-C  dicyclomine (BENTYL) 10 MG capsule TAKE 1 CAPSULE (10 MG TOTAL) BY MOUTH 2 (TWO) TIMES DAILY AS NEEDED FOR SPASMS. 02/15/20 02/14/21  Charlott Rakes, MD  fluticasone (FLONASE) 50 MCG/ACT nasal spray Place into the nose. 04/14/19   [provider]  levothyroxine (SYNTHROID) 150 MCG tablet Take 1 tablet (150 mcg total) by mouth daily. 05/09/20   Charlott Rakes, MD  norethindrone (AYGESTIN) 5 MG tablet Take 3 tablets (15 mg total) by mouth daily. 2 tablets daily after bleeding stops x 1 week, then 1 tablet daily x 1 week 12/09/19   Donnamae Jude, MD  ondansetron (ZOFRAN) 4 MG tablet TAKE 1 TABLET (4 MG TOTAL) BY MOUTH EVERY 8 (EIGHT) HOURS AS NEEDED FOR NAUSEA OR VOMITING. 02/15/20 02/14/21  Charlott Rakes, MD  promethazine-dextromethorphan (PROMETHAZINE-DM) 6.25-15 MG/5ML syrup TAKE 5 MLS BY MOUTH AT BEDTIME AS NEEDED FOR COUGH. Patient not taking: Reported on 02/15/2020 02/02/20 02/01/21  Jaynee Eagles, PA-C  pseudoephedrine (SUDAFED) 60 MG tablet TAKE 1 TABLET (60 MG TOTAL) BY MOUTH EVERY 8 (EIGHT) HOURS  AS NEEDED FOR CONGESTION. Patient not taking: Reported on 02/15/2020 02/02/20 02/01/21  Jaynee Eagles, PA-C  venlafaxine XR (EFFEXOR-XR) 75 MG 24 hr capsule TAKE 1 CAPSULE (75 MG TOTAL) BY MOUTH DAILY WITH BREAKFAST. 02/15/20 02/14/21  Charlott Rakes, MD  FLUoxetine (PROZAC) 20 MG capsule Take 1 capsule (20 mg total) by mouth daily. Patient not taking: No sig reported 12/30/19 02/15/20  Charlott Rakes, MD  medroxyPROGESTERone (PROVERA) 10 MG tablet Take 1 tablet (10 mg total) by mouth daily. 09/04/19 09/08/19  Charlott Rakes, MD  prazosin (MINIPRESS) 1 MG capsule Take 1 capsule (1 mg total) by mouth at bedtime. 10/22/17 06/03/19  Charlott Rakes, MD  topiramate (TOPAMAX) 50 MG tablet Take 1 tablet (50 mg total) by mouth 2 (two) times daily. Patient not taking: Reported on 04/14/2016 11/29/15  06/03/19  Charlott Rakes, MD    Family History Family History  Problem Relation Age of Onset  . Diabetes Mother   . Hypertension Mother   . Diabetes Father   . Hypertension Father   . Heart disease Brother     Social History Social History   Tobacco Use  . Smoking status: Never Smoker  . Smokeless tobacco: Never Used  Vaping Use  . Vaping Use: Never used  Substance Use Topics  . Alcohol use: No  . Drug use: No     Allergies   Fish allergy and Milk-related compounds   Review of Systems Review of Systems  Constitutional: Positive for activity change, chills, fatigue and fever. Negative for appetite change.  Respiratory: Positive for cough and shortness of breath.   Cardiovascular: Positive for chest pain.  Gastrointestinal: Positive for nausea and vomiting. Negative for abdominal pain and diarrhea.  Musculoskeletal: Negative for arthralgias and myalgias.  Neurological: Positive for dizziness, light-headedness and headaches.     Physical Exam Triage Vital Signs ED Triage Vitals  Enc Vitals Group     BP 05/21/20 1104 (!) 89/68     Pulse Rate 05/21/20 1059 80     Resp 05/21/20 1104 18     Temp 05/21/20 1104 98.1 F (36.7 C)     Temp Source 05/21/20 1104 Oral     SpO2 05/21/20 1059 100 %     Weight --      Height --      Head Circumference --      Peak Flow --      Pain Score 05/21/20 1101 6     Pain Loc --      Pain Edu? --      Excl. in [place]? --    No data found.  Updated Vital Signs BP (!) 81/42 (BP Location: Left Arm)   Pulse 80   Temp 98.1 F (36.7 C) (Oral)   Resp (!) 30   LMP  (LMP Unknown)   SpO2 100%   Visual Acuity Right Eye Distance:   Left Eye Distance:   Bilateral Distance:    Right Eye Near:   Left Eye Near:    Bilateral Near:     Physical Exam Vitals reviewed.  Constitutional:      General: She is awake. She is in acute distress.     Appearance: Normal appearance. She is ill-appearing.     Comments: Very pleasant female  laying in triage room in obvious discomfort and distress.  HENT:     Head: Normocephalic and atraumatic.  Pulmonary:     Effort: Tachypnea present.  Psychiatric:        Behavior: Behavior is cooperative.  UC Treatments / Results  Labs (all labs ordered are listed, but only abnormal results are displayed) Labs Reviewed - No data to display  EKG   Radiology No results found.  Procedures Procedures (including critical care time)  Medications Ordered in UC Medications - No data to display  Initial Impression / Assessment and Plan / UC Course  I have reviewed the triage vital signs and the nursing notes.  Pertinent labs & imaging results that were available during my care of the patient were reviewed by me and considered in my medical decision making (see chart for details).     Repeat blood pressure worsened to 81/42 and patient is in obvious distress and tachypneic.  EKG obtained showed normal sinus rhythm with nonspecific ST changes in V1 and V3 compared to 08/01/2017 tracing and no acute findings.  Patient was transported by EMS given clinical presentation to ER at [facility].  Final Clinical Impressions(s) / UC Diagnoses   Final diagnoses:  Hypotension, unspecified hypotension type  Lightheadedness  Nausea and vomiting, intractability of vomiting not specified, unspecified vomiting type   Discharge Instructions   None    ED Prescriptions    None     PDMP not reviewed this encounter.   Terrilee Croak, PA-C 05/21/20 1127

## 2020-05-21 NOTE — ED Triage Notes (Signed)
Pt c/o fever, body aches and headaches X 4 days. She states she has not been eating or drinking like usual.

## 2020-05-21 NOTE — ED Notes (Signed)
Reported vital signs and chief complaint to Charge RN and Medical Provider assigned to patient.

## 2020-05-21 NOTE — ED Notes (Signed)
Pt oob and ambulated to bathroom without difficulty

## 2020-05-21 NOTE — Discharge Instructions (Signed)
You appear to have been sick for 5 days with COVID-19 infection.  At this point the treatment includes rest, fluids, gradual increase activity as tolerated.  You cannot work or be around other people until all your symptoms are gone +2 days.  Always wear a mask if you are within 6 feet of anybody else.  For pain or fever use Tylenol.  For cough use Robitussin-DM.

## 2020-05-21 NOTE — ED Notes (Signed)
TC to ED ,report to [employer]

## 2020-05-21 NOTE — ED Notes (Signed)
Pt states she has chest pain.

## 2020-06-06 ENCOUNTER — Other Ambulatory Visit (HOSPITAL_COMMUNITY): Payer: Self-pay

## 2020-07-11 ENCOUNTER — Other Ambulatory Visit (HOSPITAL_COMMUNITY): Payer: Self-pay

## 2020-07-13 ENCOUNTER — Other Ambulatory Visit (HOSPITAL_COMMUNITY): Payer: Self-pay

## 2020-08-10 ENCOUNTER — Other Ambulatory Visit (HOSPITAL_COMMUNITY): Payer: Self-pay

## 2020-09-03 ENCOUNTER — Encounter (HOSPITAL_COMMUNITY): Payer: Self-pay | Admitting: Emergency Medicine

## 2020-09-03 ENCOUNTER — Ambulatory Visit (HOSPITAL_COMMUNITY)
Admission: EM | Admit: 2020-09-03 | Discharge: 2020-09-03 | Disposition: A | Discharge status: Home / Self Care | Payer: 59 | Attending: Emergency Medicine | Admitting: Emergency Medicine

## 2020-09-03 DIAGNOSIS — Z20822 Contact with and (suspected) exposure to covid-19: Secondary | ICD-10-CM | POA: Diagnosis not present

## 2020-09-03 DIAGNOSIS — N939 Abnormal uterine and vaginal bleeding, unspecified: Secondary | ICD-10-CM | POA: Insufficient documentation

## 2020-09-03 DIAGNOSIS — B349 Viral infection, unspecified: Secondary | ICD-10-CM | POA: Insufficient documentation

## 2020-09-03 LAB — SARS CORONAVIRUS 2 (TAT 6-24 HRS): SARS Coronavirus 2: NEGATIVE

## 2020-09-03 MED ORDER — IBUPROFEN 600 MG PO TABS: 600.0000 mg | ORAL_TABLET | Freq: Four times a day (QID) | ORAL | 0 refills | Status: DC | PRN

## 2020-09-03 MED ORDER — NORETHINDRONE ACETATE 5 MG PO TABS: 15.0000 mg | ORAL_TABLET | Freq: Every day | ORAL | 1 refills | Status: DC

## 2020-09-03 MED ORDER — LOPERAMIDE HCL 2 MG PO CAPS: 2.0000 mg | ORAL_CAPSULE | Freq: Four times a day (QID) | ORAL | 0 refills | Status: DC | PRN

## 2020-09-03 MED ORDER — ONDANSETRON HCL 4 MG PO TABS: 4.0000 mg | ORAL_TABLET | Freq: Four times a day (QID) | ORAL | 0 refills | Status: DC

## 2020-09-03 NOTE — ED Triage Notes (Signed)
Pt presents with vaginal bleeding, abdominal pain. Diarrhea, dizziness, and headache xs 3 days. States symptoms have been on and off for 3 months. States norethindrone would relieve symptoms.

## 2020-09-03 NOTE — Discharge Instructions (Addendum)
Your COVID test is pending 24 to 48 hours, you will be called if positive  Your symptoms are most likely related to a virus and will resolve over time  You may take Zofran every 6 hours as needed to help with nausea  You may take Imodium up to 4 times a day to help with diarrhea, be mindful over use of this medicine will have the opposite effect and make you constipated, take the first dose and see if this improves symptoms if it does you do not need to continue taking medication  Can take ibuprofen every 6 hours as needed to help with headaches  Can use of norethindrone as prescribed, you have been given a 61-monthsupply, please follow-up with [facility] for reevaluation of bleeding

## 2020-09-04 NOTE — ED Provider Notes (Signed)
[facility]    CSN: [id number] Arrival date & time: 09/03/20  1137      History   Chief Complaint Chief Complaint  Patient presents with   Vaginal Bleeding   Abdominal Pain   Dizziness   Diarrhea    HPI Kaitlyn Wise is a 50 y.o. female.   Patient presents with vaginal bleeding, varying in heaviness for 3 months.  Intermittent clotting, none currently.  Denies heavy bleeding requiring more than 1 pad in an hour.  Denies abdominal pain, nausea, vomiting, diarrhea.  Not sexually active.  History of abnormal bleeding mood, had hysterectomy with ablation planned but canceled procedure.  Was taking norethindrone to manage symptoms but ran out of medication. Requesting refill .   Concerned with intermittent generalized headache, nonproductive cough, nausea without vomiting, diarrhea described as soft for 3 days.  Poor appetite but tolerating food.  Denies fever, chills, body aches, ear pain or fullness, nasal congestion, rhinorrhea, shortness of breath, wheezing, abdominal pain.  No known sick contacts.  Vaccinated.  Has not attempted treatment.  Arabic interpreter used for entirety of exam Past Medical History:  Diagnosis Date   Dyslipidemia    Thyroid disease    Uterine fibroid    Vertigo     Patient Active Problem List   Diagnosis Date Noted   Abnormal uterine bleeding (AUB) 11/25/2019   Chest pain in adult 08/01/2017   Costochondritis 08/01/2017   Current severe episode of major depressive disorder without psychotic features without prior episode ([place]) 08/01/2017   Mild episode of recurrent major depressive disorder ([place]) 01/18/2017   Migraine 12/05/2015   Gastritis and gastroduodenitis 03/09/2015   Dyslipidemia 06/16/2013   Hypothyroidism 05/21/2013    Past Surgical History:  Procedure Laterality Date   CHOLECYSTECTOMY     CHOLECYSTECTOMY      OB History     Gravida  1   Para  1   Term      Preterm      AB      Living  1       SAB      IAB      Ectopic      Multiple      Live Births  1            Home Medications    Prior to Admission medications   Medication Sig Start Date End Date Taking? Authorizing Provider  ibuprofen (ADVIL) 600 MG tablet Take 1 tablet (600 mg total) by mouth every 6 (six) hours as needed. 09/03/20  Yes Benjy Kana, Leitha Schuller, NP  loperamide (IMODIUM) 2 MG capsule Take 1 capsule (2 mg total) by mouth 4 (four) times daily as needed for diarrhea or loose stools. 09/03/20  Yes Masiyah Jorstad R, NP  ondansetron (ZOFRAN) 4 MG tablet Take 1 tablet (4 mg total) by mouth every 6 (six) hours. 09/03/20  Yes Nancee Brownrigg R, NP  benzonatate (TESSALON) 100 MG capsule TAKE 1-2 CAPSULES (100-200 MG TOTAL) BY MOUTH 3 (THREE) TIMES DAILY AS NEEDED FOR COUGH. Patient not taking: Reported on 02/15/2020 02/02/20 02/01/21  Jaynee Eagles, PA-C  cetirizine (ZYRTEC) 10 MG tablet TAKE 1 TABLET (10 MG TOTAL) BY MOUTH DAILY. 02/02/20 02/01/21  Jaynee Eagles, PA-C  dicyclomine (BENTYL) 10 MG capsule TAKE 1 CAPSULE (10 MG TOTAL) BY MOUTH 2 (TWO) TIMES DAILY AS NEEDED FOR SPASMS. 02/15/20 02/14/21  Charlott Rakes, MD  fluticasone (FLONASE) 50 MCG/ACT nasal spray Place into the nose. 04/14/19   [provider]  levothyroxine (SYNTHROID) 150 MCG tablet Take 1 tablet (150 mcg total) by mouth daily. 05/09/20   Charlott Rakes, MD  norethindrone (AYGESTIN) 5 MG tablet Take 3 tablets (15 mg total) by mouth daily. 2 tablets daily after bleeding stops x 1 week, then 1 tablet daily x 1 week 09/03/20   Hans Eden, NP  promethazine-dextromethorphan (PROMETHAZINE-DM) 6.25-15 MG/5ML syrup TAKE 5 MLS BY MOUTH AT BEDTIME AS NEEDED FOR COUGH. Patient not taking: Reported on 02/15/2020 02/02/20 02/01/21  Jaynee Eagles, PA-C  pseudoephedrine (SUDAFED) 60 MG tablet TAKE 1 TABLET (60 MG TOTAL) BY MOUTH EVERY 8 (EIGHT) HOURS AS NEEDED FOR CONGESTION. Patient not taking: Reported on 02/15/2020 02/02/20 02/01/21  Jaynee Eagles, PA-C   venlafaxine XR (EFFEXOR-XR) 75 MG 24 hr capsule TAKE 1 CAPSULE (75 MG TOTAL) BY MOUTH DAILY WITH BREAKFAST. 02/15/20 02/14/21  Charlott Rakes, MD  FLUoxetine (PROZAC) 20 MG capsule Take 1 capsule (20 mg total) by mouth daily. Patient not taking: No sig reported 12/30/19 02/15/20  Charlott Rakes, MD  medroxyPROGESTERone (PROVERA) 10 MG tablet Take 1 tablet (10 mg total) by mouth daily. 09/04/19 09/08/19  Charlott Rakes, MD  prazosin (MINIPRESS) 1 MG capsule Take 1 capsule (1 mg total) by mouth at bedtime. 10/22/17 06/03/19  Charlott Rakes, MD  topiramate (TOPAMAX) 50 MG tablet Take 1 tablet (50 mg total) by mouth 2 (two) times daily. Patient not taking: Reported on 04/14/2016 11/29/15 06/03/19  Charlott Rakes, MD    Family History Family History  Problem Relation Age of Onset   Diabetes Mother    Hypertension Mother    Diabetes Father    Hypertension Father    Heart disease Brother     Social History Social History   Tobacco Use   Smoking status: Never   Smokeless tobacco: Never  Vaping Use   Vaping Use: Never used  Substance Use Topics   Alcohol use: No   Drug use: No     Allergies   Fish allergy and Milk-related compounds   Review of Systems Review of Systems Defer to HPI    Physical Exam Triage Vital Signs ED Triage Vitals  Enc Vitals Group     BP 09/03/20 1256 (!) 122/49     Pulse Rate 09/03/20 1256 78     Resp 09/03/20 1256 18     Temp 09/03/20 1256 98.1 F (36.7 C)     Temp Source 09/03/20 1256 Oral     SpO2 09/03/20 1256 100 %     Weight --      Height --      Head Circumference --      Peak Flow --      Pain Score 09/03/20 1253 5     Pain Loc --      Pain Edu? --      Excl. in [place]? --    No data found.  Updated Vital Signs BP (!) 122/49 (BP Location: Right Arm)   Pulse 78   Temp 98.1 F (36.7 C) (Oral)   Resp 18   SpO2 100%   Visual Acuity Right Eye Distance:   Left Eye Distance:   Bilateral Distance:    Right Eye Near:   Left Eye  Near:    Bilateral Near:     Physical Exam Constitutional:      Appearance: She is well-developed and normal weight.  HENT:     Head: Normocephalic.     Right Ear: Tympanic membrane, ear canal and external ear normal.  Left Ear: Tympanic membrane, ear canal and external ear normal.     Nose: Nose normal.     Mouth/Throat:     Mouth: Mucous membranes are moist.     Pharynx: No posterior oropharyngeal erythema.  Eyes:     Extraocular Movements: Extraocular movements intact.  Cardiovascular:     Rate and Rhythm: Normal rate and regular rhythm.     Pulses: Normal pulses.     Heart sounds: Normal heart sounds.  Pulmonary:     Effort: Pulmonary effort is normal.     Breath sounds: Normal breath sounds.  Abdominal:     General: Abdomen is flat. Bowel sounds are normal.     Palpations: Abdomen is soft. There is no mass.     Tenderness: There is no abdominal tenderness. There is no right CVA tenderness, left CVA tenderness or guarding.  Musculoskeletal:     Cervical back: Normal range of motion and neck supple.  Skin:    General: Skin is warm and dry.  Neurological:     General: No focal deficit present.     Mental Status: She is alert and oriented to person, place, and time.  Psychiatric:        Mood and Affect: Mood normal.        Behavior: Behavior normal.     UC Treatments / Results  Labs (all labs ordered are listed, but only abnormal results are displayed) Labs Reviewed  SARS CORONAVIRUS 2 (TAT 6-24 HRS)    EKG   Radiology No results found.  Procedures Procedures (including critical care time)  Medications Ordered in UC Medications - No data to display  Initial Impression / Assessment and Plan / UC Course  I have reviewed the triage vital signs and the nursing notes.  Pertinent labs & imaging results that were available during my care of the patient were reviewed by me and considered in my medical decision making (see chart for details).  Abnormal  vaginal bleeding Viral illness  1.  COVID test pending 2. ibuprofen 600 mg every 6 hours as needed 3.  Imodium 2 mg 4 times daily as needed, advised precaution with the use 4.  Zofran 4 mg every 6 hours as needed 5. norethindrone refilled, 2 month supply, follow up with gynecology for reevaluation of bleeding    Final Clinical Impressions(s) / UC Diagnoses   Final diagnoses:  Abnormal vaginal bleeding  Viral illness     Discharge Instructions      Your COVID test is pending 24 to 48 hours, you will be called if positive  Your symptoms are most likely related to a virus and will resolve over time  You may take Zofran every 6 hours as needed to help with nausea  You may take Imodium up to 4 times a day to help with diarrhea, be mindful over use of this medicine will have the opposite effect and make you constipated, take the first dose and see if this improves symptoms if it does you do not need to continue taking medication  Can take ibuprofen every 6 hours as needed to help with headaches  Can use of norethindrone as prescribed, you have been given a 69-monthsupply, please [facility] with [facility] for reevaluation of bleeding   ED Prescriptions     Medication Sig Dispense Auth. Provider   norethindrone (AYGESTIN) 5 MG tablet Take 3 tablets (15 mg total) by mouth daily. 2 tablets daily after bleeding stops x 1 week,  then 1 tablet daily x 1 week 90 tablet Amaiyah Nordhoff R, NP   ibuprofen (ADVIL) 600 MG tablet Take 1 tablet (600 mg total) by mouth every 6 (six) hours as needed. 30 tablet Gregg Winchell, Vincente Liberty R, NP   loperamide (IMODIUM) 2 MG capsule Take 1 capsule (2 mg total) by mouth 4 (four) times daily as needed for diarrhea or loose stools. 12 capsule Tawanda Schall R, NP   ondansetron (ZOFRAN) 4 MG tablet Take 1 tablet (4 mg total) by mouth every 6 (six) hours. 12 tablet Noland Pizano, Leitha Schuller, NP      PDMP not reviewed this encounter.   Hans Eden,  NP 09/04/20 1558

## 2020-09-12 ENCOUNTER — Ambulatory Visit: Payer: 59 | Attending: Family Medicine | Admitting: Family Medicine

## 2020-09-12 ENCOUNTER — Other Ambulatory Visit: Payer: Self-pay

## 2020-09-12 ENCOUNTER — Other Ambulatory Visit (HOSPITAL_COMMUNITY): Payer: Self-pay

## 2020-09-12 ENCOUNTER — Encounter: Payer: Self-pay | Admitting: Family Medicine

## 2020-09-12 VITALS — BP 118/75 | HR 95 | Ht 62.0 in | Wt 165.4 lb

## 2020-09-12 DIAGNOSIS — R0609 Other forms of dyspnea: Secondary | ICD-10-CM | POA: Diagnosis not present

## 2020-09-12 DIAGNOSIS — E038 Other specified hypothyroidism: Secondary | ICD-10-CM | POA: Diagnosis not present

## 2020-09-12 DIAGNOSIS — N939 Abnormal uterine and vaginal bleeding, unspecified: Secondary | ICD-10-CM

## 2020-09-12 DIAGNOSIS — R1013 Epigastric pain: Secondary | ICD-10-CM

## 2020-09-12 DIAGNOSIS — Z1211 Encounter for screening for malignant neoplasm of colon: Secondary | ICD-10-CM

## 2020-09-12 DIAGNOSIS — F330 Major depressive disorder, recurrent, mild: Secondary | ICD-10-CM

## 2020-09-12 MED ORDER — OMEPRAZOLE 40 MG PO CPDR
40.0000 mg | DELAYED_RELEASE_CAPSULE | Freq: Every day | ORAL | 3 refills | Status: DC
  Filled 2020-09-12: qty 30, 30d supply, fill #0

## 2020-09-12 MED FILL — Dicyclomine HCl Cap 10 MG: ORAL | 30 days supply | Qty: 60 | Fill #1 | Status: AC

## 2020-09-12 NOTE — Progress Notes (Signed)
Subjective:  Patient ID: Kaitlyn Wise, female    DOB: [birth year]  Age: 50 y.o. MRN: [id number]  CC: Hypothyroidism   HPI Kaitlyn Wise is a 50 y.o. year old female with a history of hypothyroidism, Depression, history of COVID in 05/2020 who presents today for a follow up visit.   Interval History: She complains of fatigue, dyspnea, dizziness, reduced exercise tolerance..Was schedule to have D&C which was cancelled. She states she could not find someone to accompany her to the procedure.  Menorrhagia is persistent and she plans on undergoing her uterine surgery when she goes back to her home country of [place] in 5 months. She has epigastric pain which sometimes wakes her up in the middle of the night. She she shows me a bottle of Bentyl which she takes 3 times/daily and has been effective.  She endorses compliance with thyroid medications. Not taking her antidepressant and she feels better.  She requests a note for work till 09/15/20 due to her dizziness and fatigue.  Past Medical History:  Diagnosis Date   Dyslipidemia    Thyroid disease    Uterine fibroid    Vertigo     Past Surgical History:  Procedure Laterality Date   CHOLECYSTECTOMY     CHOLECYSTECTOMY      Family History  Problem Relation Age of Onset   Diabetes Mother    Hypertension Mother    Diabetes Father    Hypertension Father    Heart disease Brother     Allergies  Allergen Reactions   Fish Allergy Itching   Milk-Related Compounds Diarrhea and Itching    Outpatient Medications Prior to Visit  Medication Sig Dispense Refill   benzonatate (TESSALON) 100 MG capsule TAKE 1-2 CAPSULES (100-200 MG TOTAL) BY MOUTH 3 (THREE) TIMES DAILY AS NEEDED FOR COUGH. (Patient not taking: Reported on 02/15/2020) 60 capsule 0   cetirizine (ZYRTEC) 10 MG tablet TAKE 1 TABLET (10 MG TOTAL) BY MOUTH DAILY. 30 tablet 0   dicyclomine (BENTYL) 10 MG capsule TAKE 1 CAPSULE (10 MG TOTAL) BY MOUTH 2  (TWO) TIMES DAILY AS NEEDED FOR SPASMS. 60 capsule 2   fluticasone (FLONASE) 50 MCG/ACT nasal spray Place into the nose.     ibuprofen (ADVIL) 600 MG tablet Take 1 tablet (600 mg total) by mouth every 6 (six) hours as needed. 30 tablet 0   levothyroxine (SYNTHROID) 150 MCG tablet Take 1 tablet (150 mcg total) by mouth daily. 30 tablet 3   loperamide (IMODIUM) 2 MG capsule Take 1 capsule (2 mg total) by mouth 4 (four) times daily as needed for diarrhea or loose stools. 12 capsule 0   norethindrone (AYGESTIN) 5 MG tablet Take 3 tablets (15 mg total) by mouth daily. 2 tablets daily after bleeding stops x 1 week, then 1 tablet daily x 1 week 90 tablet 1   ondansetron (ZOFRAN) 4 MG tablet Take 1 tablet (4 mg total) by mouth every 6 (six) hours. 12 tablet 0   promethazine-dextromethorphan (PROMETHAZINE-DM) 6.25-15 MG/5ML syrup TAKE 5 MLS BY MOUTH AT BEDTIME AS NEEDED FOR COUGH. (Patient not taking: Reported on 02/15/2020) 100 mL 0   pseudoephedrine (SUDAFED) 60 MG tablet TAKE 1 TABLET (60 MG TOTAL) BY MOUTH EVERY 8 (EIGHT) HOURS AS NEEDED FOR CONGESTION. (Patient not taking: Reported on 02/15/2020) 30 tablet 0   venlafaxine XR (EFFEXOR-XR) 75 MG 24 hr capsule TAKE 1 CAPSULE (75 MG TOTAL) BY MOUTH DAILY WITH BREAKFAST. 30 capsule 3   No facility-administered medications  prior to visit.     ROS Review of Systems  Constitutional:  Positive for fatigue. Negative for activity change and appetite change.  HENT:  Negative for congestion, sinus pressure and sore throat.   Eyes:  Negative for visual disturbance.  Respiratory:  Negative for cough, chest tightness, shortness of breath and wheezing.   Cardiovascular:  Negative for chest pain and palpitations.  Gastrointestinal:  Positive for abdominal pain. Negative for abdominal distention and constipation.  Endocrine: Negative for polydipsia.  Genitourinary:  Negative for dysuria and frequency.  Musculoskeletal:  Negative for arthralgias and back pain.   Skin:  Negative for rash.  Neurological:  Positive for light-headedness. Negative for tremors and numbness.  Hematological:  Does not bruise/bleed easily.  Psychiatric/Behavioral:  Negative for agitation, behavioral problems and dysphoric mood.    Objective:  BP 118/75   Pulse 95   Ht '5\' 2"'$  (1.575 m)   Wt 165 lb 6.4 oz (75 kg)   SpO2 100%   BMI 30.25 kg/m   BP/Weight 09/12/2020 [id number]  Systolic BP 123456 [id number]  Diastolic BP 75 49 78  Wt. (Lbs) 165.4 - -  BMI 30.25 - -  Some encounter information is confidential and restricted. Go to Review Flowsheets activity to see all data.      Physical Exam Constitutional:      Appearance: She is well-developed.  Cardiovascular:     Rate and Rhythm: Normal rate.     Heart sounds: Normal heart sounds. No murmur heard. Pulmonary:     Effort: Pulmonary effort is normal.     Breath sounds: Normal breath sounds. No wheezing or rales.  Chest:     Chest wall: No tenderness.  Abdominal:     General: Bowel sounds are normal. There is no distension.     Palpations: Abdomen is soft. There is no mass.     Tenderness: There is no abdominal tenderness.  Musculoskeletal:        General: Normal range of motion.     Right lower leg: No edema.     Left lower leg: No edema.  Neurological:     Mental Status: She is alert and oriented to person, place, and time.  Psychiatric:        Mood and Affect: Mood normal.     Comments: Dysphoric mood    CMP Latest Ref Rng & Units 02/15/2020 10/15/2019 05/06/2019  Glucose 65 - 99 mg/dL 89 89 99  BUN 6 - 24 mg/dL '13 15 12  '$ Creatinine 0.57 - 1.00 mg/dL 0.99 0.74 0.78  Sodium 134 - 144 mmol/L 141 136 136  Potassium 3.5 - 5.2 mmol/L 4.7 5.0 4.1  Chloride 96 - 106 mmol/L 106 103 102  CO2 20 - 29 mmol/L '22 24 22  '$ Calcium 8.7 - 10.2 mg/dL 9.3 8.9 9.1  Total Protein 6.5 - 8.1 g/dL - 7.0 7.1  Total Bilirubin 0.3 - 1.2 mg/dL - 0.5 <0.2  Alkaline Phos 38 - 126 U/L - 69 129(H)  AST 15 - 41 U/L - 21 22   ALT 0 - 44 U/L - 25 12    Lipid Panel     Component Value Date/Time   CHOL 187 09/23/2019 1043   TRIG 53 09/23/2019 1043   HDL 35 (L) 09/23/2019 1043   CHOLHDL 5.3 (H) 09/23/2019 1043   CHOLHDL 2.9 08/25/2015 1111   VLDL 17 08/25/2015 1111   LDLCALC 142 (H) 09/23/2019 1043    CBC    Component Value Date/Time  WBC 2.4 (L) 05/21/2020 1225   RBC 5.19 (H) 05/21/2020 1225   HGB 11.6 (L) 05/21/2020 1225   HGB 11.3 09/23/2019 1043   HCT 38.5 05/21/2020 1225   HCT 36.9 09/23/2019 1043   PLT 121 (L) 05/21/2020 1225   PLT 284 09/23/2019 1043   MCV 74.2 (L) 05/21/2020 1225   MCV 86 09/23/2019 1043   MCH 22.4 (L) 05/21/2020 1225   MCHC 30.1 05/21/2020 1225   RDW 18.1 (H) 05/21/2020 1225   RDW 17.0 (H) 09/23/2019 1043   LYMPHSABS 1.8 10/15/2019 1555   LYMPHSABS 1.7 09/23/2019 1043   MONOABS 0.4 10/15/2019 1555   EOSABS 0.3 10/15/2019 1555   EOSABS 0.4 09/23/2019 1043   BASOSABS 0.1 10/15/2019 1555   BASOSABS 0.1 09/23/2019 1043    Lab Results  Component Value Date   TSH 28.700 (H) 02/15/2020      Depression screen PHQ 2/9 09/12/2020 02/15/2020 02/05/2020 01/20/2020 12/23/2019  Decreased Interest 1 2 0 1 3  Down, Depressed, Hopeless 1 1 0 1 3  PHQ - 2 Score 2 3 0 2 6  Altered sleeping 1 2 0 3 3  Tired, decreased energy '1 2 1 3 2  '$ Change in appetite 1 2 0 3 3  Feeling bad or failure about yourself  1 2 0 1 3  Trouble concentrating 1 2 0 2 3  Moving slowly or fidgety/restless 1 2 0 1 3  Suicidal thoughts 1 2 0 0 0  PHQ-9 Score '9 17 1 15 23  '$ Difficult doing work/chores - - - - -  Some recent data might be hidden     Assessment & Plan:  1. Other specified hypothyroidism Uncontrolled from last set of labs Will send of thyroid panel and adjust regimen accordingly - TSH - T4, free - Basic Metabolic Panel - Lipid panel  2. Other form of dyspnea Could be secondary to anemia Will also evaluate for cardiac etiology - Pro b natriuretic peptide (BNP)  3. Epigastric  pain Suspicious for gastritis We will commence PPI - H. pylori antibody, IgA - omeprazole (PRILOSEC) 40 MG capsule; Take 1 capsule (40 mg total) by mouth daily.  Dispense: 30 capsule; Refill: 3  4. Abnormal uterine bleeding Needs to undergo D&C but patient has been unable to secure someone to accompany her for procedure Given symptoms of lightheadedness will need to evaluate for anemia - CBC with Differential/Platelet  5. Screening for colon cancer - Ambulatory referral to Gastroenterology  6.  Mild episode of recurrent major depressive disorder Improved significantly with PHQ-9 of 9 which is down from 17 She no longer needs Effexor  Health Care Maintenance: Patient states she received flu shot already Meds ordered this encounter  Medications   omeprazole (PRILOSEC) 40 MG capsule    Sig: Take 1 capsule (40 mg total) by mouth daily.    Dispense:  30 capsule    Refill:  3     Follow-up: Return in about 3 months (around 12/13/2020) for Medical conditions.       Charlott Rakes, MD, FAAFP. [facility] and [place], [place]   09/12/2020, 12:25 PM

## 2020-09-14 ENCOUNTER — Encounter: Payer: Self-pay | Admitting: Family Medicine

## 2020-09-14 ENCOUNTER — Other Ambulatory Visit: Payer: Self-pay

## 2020-09-14 ENCOUNTER — Other Ambulatory Visit: Payer: Self-pay | Admitting: Family Medicine

## 2020-09-14 DIAGNOSIS — E038 Other specified hypothyroidism: Secondary | ICD-10-CM

## 2020-09-14 LAB — CBC WITH DIFFERENTIAL/PLATELET
Basophils Absolute: 0.1 10*3/uL (ref 0.0–0.2)
Basos: 2 %
EOS (ABSOLUTE): 0.4 10*3/uL (ref 0.0–0.4)
Eos: 8 %
Hematocrit: 37.4 % (ref 34.0–46.6)
Hemoglobin: 11 g/dL — ABNORMAL LOW (ref 11.1–15.9)
Immature Grans (Abs): 0 10*3/uL (ref 0.0–0.1)
Immature Granulocytes: 0 %
Lymphocytes Absolute: 1.7 10*3/uL (ref 0.7–3.1)
Lymphs: 31 %
MCH: 22 pg — ABNORMAL LOW (ref 26.6–33.0)
MCHC: 29.4 g/dL — ABNORMAL LOW (ref 31.5–35.7)
MCV: 75 fL — ABNORMAL LOW (ref 79–97)
Monocytes Absolute: 0.3 10*3/uL (ref 0.1–0.9)
Monocytes: 6 %
Neutrophils Absolute: 2.9 10*3/uL (ref 1.4–7.0)
Neutrophils: 53 %
Platelets: 360 10*3/uL (ref 150–450)
RBC: 5.01 x10E6/uL (ref 3.77–5.28)
RDW: 16.2 % — ABNORMAL HIGH (ref 11.7–15.4)
WBC: 5.5 10*3/uL (ref 3.4–10.8)

## 2020-09-14 LAB — LIPID PANEL
Chol/HDL Ratio: 3.8 ratio (ref 0.0–4.4)
Cholesterol, Total: 186 mg/dL (ref 100–199)
HDL: 49 mg/dL (ref >39)
LDL Chol Calc (NIH): 119 mg/dL — ABNORMAL HIGH (ref 0–99)
Triglycerides: 100 mg/dL (ref 0–149)
VLDL Cholesterol Cal: 18 mg/dL (ref 5–40)

## 2020-09-14 LAB — BASIC METABOLIC PANEL
BUN/Creatinine Ratio: 16 (ref 9–23)
BUN: 13 mg/dL (ref 6–24)
CO2: 21 mmol/L (ref 20–29)
Calcium: 9.9 mg/dL (ref 8.7–10.2)
Chloride: 103 mmol/L (ref 96–106)
Creatinine, Ser: 0.82 mg/dL (ref 0.57–1.00)
Glucose: 93 mg/dL (ref 65–99)
Potassium: 5 mmol/L (ref 3.5–5.2)
Sodium: 139 mmol/L (ref 134–144)
eGFR: 88 mL/min/{1.73_m2} (ref >59)

## 2020-09-14 LAB — TSH: TSH: 0.01 u[IU]/mL — ABNORMAL LOW (ref 0.450–4.500)

## 2020-09-14 LAB — T4, FREE: Free T4: 1.49 ng/dL (ref 0.82–1.77)

## 2020-09-14 LAB — PRO B NATRIURETIC PEPTIDE: NT-Pro BNP: 66 pg/mL (ref 0–249)

## 2020-09-14 LAB — H. PYLORI ANTIBODY, IGA: H. pylori, IgA Abs: 33.4 units — ABNORMAL HIGH (ref 0.0–8.9)

## 2020-09-14 MED ORDER — CLARITHROMYCIN 500 MG PO TABS
500.0000 mg | ORAL_TABLET | Freq: Two times a day (BID) | ORAL | 0 refills | Status: DC
  Filled 2020-09-14: qty 28, 14d supply, fill #0

## 2020-09-14 MED ORDER — LEVOTHYROXINE SODIUM 137 MCG PO TABS
137.0000 ug | ORAL_TABLET | Freq: Every day | ORAL | 6 refills | Status: DC
  Filled 2020-09-14: qty 30, 30d supply, fill #0
  Filled 2020-10-21: qty 30, 30d supply, fill #1
  Filled 2020-11-20: qty 30, 30d supply, fill #2
  Filled 2020-12-19: qty 30, 30d supply, fill #3
  Filled 2021-01-17: qty 30, 30d supply, fill #4
  Filled 2021-02-03: qty 30, 30d supply, fill #0

## 2020-09-14 MED ORDER — AMOXICILLIN 500 MG PO CAPS
1000.0000 mg | ORAL_CAPSULE | Freq: Two times a day (BID) | ORAL | 0 refills | Status: DC
  Filled 2020-09-14: qty 56, 14d supply, fill #0

## 2020-09-16 ENCOUNTER — Other Ambulatory Visit: Payer: Self-pay

## 2020-09-20 ENCOUNTER — Other Ambulatory Visit: Payer: Self-pay

## 2020-09-27 ENCOUNTER — Telehealth: Payer: Self-pay

## 2020-09-27 NOTE — Telephone Encounter (Signed)
Patient has viewed results via mychart  Appointment has been made for retest.

## 2020-09-27 NOTE — Telephone Encounter (Signed)
-----   Message from Charlott Rakes, MD sent at 09/14/2020  8:20 AM EDT ----- Please inform her that her thyroid labs abnormal and I have decreased her dose of levothyroxine.  Cholesterol is normal, kidney and liver functions are normal.  Her blood count is on the lower border of normal which is not bad.  She tested positive for H. pylori which can explain her abdominal symptoms and I have sent a combination of antibiotics to her pharmacy for her.  Repeat H. pylori test recommended in 1 month

## 2020-10-21 ENCOUNTER — Other Ambulatory Visit: Payer: Self-pay

## 2020-10-21 ENCOUNTER — Other Ambulatory Visit (HOSPITAL_COMMUNITY): Payer: Self-pay

## 2020-10-27 ENCOUNTER — Ambulatory Visit: Payer: 59 | Attending: Family Medicine

## 2020-10-27 ENCOUNTER — Other Ambulatory Visit: Payer: Self-pay

## 2020-10-27 DIAGNOSIS — R1013 Epigastric pain: Secondary | ICD-10-CM

## 2020-10-28 LAB — H. PYLORI BREATH TEST: H pylori Breath Test: NEGATIVE

## 2020-11-03 ENCOUNTER — Telehealth: Payer: Self-pay

## 2020-11-03 NOTE — Telephone Encounter (Signed)
Will forward to nurse 

## 2020-11-03 NOTE — Telephone Encounter (Signed)
Patient was called and a voicemail was left informing patient to return phone call for lab results.   Interpreter:Mahmoud [id number]

## 2020-11-03 NOTE — Telephone Encounter (Signed)
Pt called back and I got through to an interpreter but could not get through to [facility].

## 2020-11-10 NOTE — Telephone Encounter (Signed)
Pt was called and informed of normal H.pylori test.

## 2020-11-21 ENCOUNTER — Other Ambulatory Visit: Payer: Self-pay

## 2020-12-19 ENCOUNTER — Other Ambulatory Visit: Payer: Self-pay

## 2020-12-27 ENCOUNTER — Ambulatory Visit: Payer: 59 | Admitting: Gastroenterology

## 2020-12-29 NOTE — Telephone Encounter (Signed)
This encounter needs provider sign off before encounter can be closed

## 2021-01-06 ENCOUNTER — Ambulatory Visit: Payer: 59 | Admitting: Family Medicine

## 2021-01-17 ENCOUNTER — Ambulatory Visit (INDEPENDENT_AMBULATORY_CARE_PROVIDER_SITE_OTHER): Payer: 59 | Admitting: Gastroenterology

## 2021-01-17 ENCOUNTER — Encounter: Payer: Self-pay | Admitting: Gastroenterology

## 2021-01-17 ENCOUNTER — Other Ambulatory Visit (INDEPENDENT_AMBULATORY_CARE_PROVIDER_SITE_OTHER): Payer: 59

## 2021-01-17 ENCOUNTER — Other Ambulatory Visit: Payer: Self-pay

## 2021-01-17 VITALS — BP 100/60 | HR 80 | Ht 62.0 in | Wt 164.0 lb

## 2021-01-17 DIAGNOSIS — R1013 Epigastric pain: Secondary | ICD-10-CM

## 2021-01-17 DIAGNOSIS — Z1211 Encounter for screening for malignant neoplasm of colon: Secondary | ICD-10-CM

## 2021-01-17 DIAGNOSIS — R197 Diarrhea, unspecified: Secondary | ICD-10-CM

## 2021-01-17 DIAGNOSIS — K219 Gastro-esophageal reflux disease without esophagitis: Secondary | ICD-10-CM

## 2021-01-17 DIAGNOSIS — D509 Iron deficiency anemia, unspecified: Secondary | ICD-10-CM

## 2021-01-17 LAB — COMPREHENSIVE METABOLIC PANEL
ALT: 14 U/L (ref 0–35)
AST: 14 U/L (ref 0–37)
Albumin: 3.8 g/dL (ref 3.5–5.2)
Alkaline Phosphatase: 126 U/L — ABNORMAL HIGH (ref 39–117)
BUN: 15 mg/dL (ref 6–23)
CO2: 27 mEq/L (ref 19–32)
Calcium: 9.5 mg/dL (ref 8.4–10.5)
Chloride: 105 mEq/L (ref 96–112)
Creatinine, Ser: 0.63 mg/dL (ref 0.40–1.20)
GFR: 104.75 mL/min (ref >60.00)
Glucose, Bld: 86 mg/dL (ref 70–99)
Potassium: 4.2 mEq/L (ref 3.5–5.1)
Sodium: 138 mEq/L (ref 135–145)
Total Bilirubin: 0.2 mg/dL (ref 0.2–1.2)
Total Protein: 7.2 g/dL (ref 6.0–8.3)

## 2021-01-17 LAB — CBC WITH DIFFERENTIAL/PLATELET
Basophils Absolute: 0.1 10*3/uL (ref 0.0–0.1)
Basophils Relative: 1.2 % (ref 0.0–3.0)
Eosinophils Absolute: 0.5 10*3/uL (ref 0.0–0.7)
Eosinophils Relative: 7.5 % — ABNORMAL HIGH (ref 0.0–5.0)
HCT: 33.2 % — ABNORMAL LOW (ref 36.0–46.0)
Hemoglobin: 10.2 g/dL — ABNORMAL LOW (ref 12.0–15.0)
Lymphocytes Relative: 30.2 % (ref 12.0–46.0)
Lymphs Abs: 2 10*3/uL (ref 0.7–4.0)
MCHC: 30.8 g/dL (ref 30.0–36.0)
MCV: 73.5 fl — ABNORMAL LOW (ref 78.0–100.0)
Monocytes Absolute: 0.5 10*3/uL (ref 0.1–1.0)
Monocytes Relative: 7.4 % (ref 3.0–12.0)
Neutro Abs: 3.5 10*3/uL (ref 1.4–7.7)
Neutrophils Relative %: 53.7 % (ref 43.0–77.0)
Platelets: 284 10*3/uL (ref 150.0–400.0)
RBC: 4.51 Mil/uL (ref 3.87–5.11)
RDW: 17.8 % — ABNORMAL HIGH (ref 11.5–15.5)
WBC: 6.6 10*3/uL (ref 4.0–10.5)

## 2021-01-17 LAB — FERRITIN: Ferritin: 3.6 ng/mL — ABNORMAL LOW (ref 10.0–291.0)

## 2021-01-17 LAB — IBC PANEL
Iron: 25 ug/dL — ABNORMAL LOW (ref 42–145)
Saturation Ratios: 4.9 % — ABNORMAL LOW (ref 20.0–50.0)
TIBC: 508.2 ug/dL — ABNORMAL HIGH (ref 250.0–450.0)
Transferrin: 363 mg/dL — ABNORMAL HIGH (ref 212.0–360.0)

## 2021-01-17 LAB — LIPASE: Lipase: 41 U/L (ref 11.0–59.0)

## 2021-01-17 MED ORDER — OMEPRAZOLE 20 MG PO CPDR
20.0000 mg | DELAYED_RELEASE_CAPSULE | Freq: Every day | ORAL | 1 refills | Status: DC
  Filled 2021-01-17: qty 60, 60d supply, fill #0
  Filled 2021-02-03: qty 30, 30d supply, fill #0
  Filled 2021-06-06: qty 30, 30d supply, fill #1
  Filled 2021-07-28: qty 30, 30d supply, fill #2

## 2021-01-17 MED ORDER — CHOLESTYRAMINE 4 G PO PACK
4.0000 g | PACK | Freq: Two times a day (BID) | ORAL | 1 refills | Status: DC
  Filled 2021-01-17 – 2021-02-03 (×2): qty 60, 30d supply, fill #0

## 2021-01-17 NOTE — Patient Instructions (Signed)
If you are age 49 or older, your body mass index should be between 23-30. Your Body mass index is 30 kg/m. If this is out of the aforementioned range listed, please consider follow up with your Primary Care Provider.  If you are age 87 or younger, your body mass index should be between 19-25. Your Body mass index is 30 kg/m. If this is out of the aformentioned range listed, please consider follow up with your Primary Care Provider.   Your provider has requested that you go to the basement level for lab work before leaving today. Press "B" on the elevator. The lab is located at the first door on the left as you exit the elevator.  We have sent the following medications to your pharmacy for you to pick up at your convenience: Prilosec 20 mg daily, Cholestyramine 4 mg 1-2 times daily.   Due to recent changes in healthcare laws, you may see the results of your imaging and laboratory studies on MyChart before your provider has had a chance to review them.  We understand that in some cases there may be results that are confusing or concerning to you. Not all laboratory results come back in the same time frame and the provider may be waiting for multiple results in order to interpret others.  Please give [place] 48 hours in order for your provider to thoroughly review all the results before contacting the office for clarification of your results.    The [facility] providers would like to encourage you to use [facility] to communicate with providers for non-urgent requests or questions.  Due to long hold times on the telephone, sending your provider a message by [facility] may be a faster and more efficient way to get a response.  Please allow 48 business hours for a response.  Please remember that this is for non-urgent requests.   It was a pleasure to see you today!  Thank you for trusting me with your gastrointestinal care!    Scott E.Candis Schatz, MD

## 2021-01-17 NOTE — Progress Notes (Signed)
HPI : Kaitlyn Wise is a very pleasant 51 year old [place] female with a history of hypothyroidism who is referred to [place] by Dr. Charlott Rakes for further evaluation of episodic abdominal pain.  She is accompanied by an Counsellor today in clinic.  She states that she has had problems with abdominal pain for over 10 years, but the pain episodes have been more frequent now.  Whereas they used occur once every few months, now they are occurring multiple times per week.  She describes burning, sharp, squeezing epigastric pain which typically lasts 20 -30 minutes, often associated with nausea and vomiting.  She also reports heartburn 'all the time'.  She sometimes experiences throat irritation/phlegm and regurgitation of stomach acid.    These symptoms occur both day and night and she has been woken from sleep on occasion by the pain.  Her symptoms do not seem to vary with her diet. She used to take Bentyl for the pain which helped, but now it does not seem to have any affect on the pain. She had a cholecystectomy in 2012 for gallstones and reports that her current symptoms are very similar to the symptoms she had when her gallbladder was removed. She has chronic diarrhea which comes and goes.  Often associated with urgency, but no incontinence.  No blood in the stool.  Constipation not a problem for her.  This started after her gallbladder was removed. She was treated for H. pylori based on a positive IgA level.  A subsequent H. pylori breath test was negative.  Her CBC shows a consistent mild microcytic anemia.  An iron panel in 2021 was consistent with iron deficiency (ferritin 20, saturation 5%, serum iron 18).  Past Medical History:  Diagnosis Date   Dyslipidemia    Thyroid disease    Uterine fibroid    Vertigo      Past Surgical History:  Procedure Laterality Date   CHOLECYSTECTOMY     CHOLECYSTECTOMY     Family History  Problem Relation Age of Onset   Diabetes Mother     Hypertension Mother    Diabetes Father    Hypertension Father    Heart disease Brother    Social History   Tobacco Use   Smoking status: Never   Smokeless tobacco: Never  Vaping Use   Vaping Use: Never used  Substance Use Topics   Alcohol use: No   Drug use: No   Current Outpatient Medications  Medication Sig Dispense Refill   dicyclomine (BENTYL) 10 MG capsule TAKE 1 CAPSULE (10 MG TOTAL) BY MOUTH 2 (TWO) TIMES DAILY AS NEEDED FOR SPASMS. 60 capsule 2   levothyroxine (SYNTHROID) 137 MCG tablet Take 1 tablet (137 mcg total) by mouth daily. 30 tablet 6   No current facility-administered medications for this visit.   Allergies  Allergen Reactions   Fish Allergy Itching   Milk-Related Compounds Diarrhea and Itching     Review of Systems: All systems reviewed and negative except where noted in HPI.    No results found.  Physical Exam: BP 100/60    Pulse 80    Ht 5\' 2"  (1.575 m)    Wt 164 lb (74.4 kg)    BMI 30.00 kg/m  Constitutional: Pleasant,well-developed, [place] female in no acute distress. HEENT: Normocephalic and atraumatic. Conjunctivae are normal. No scleral icterus. Neck supple.  Cardiovascular: Normal rate, regular rhythm.  Pulmonary/chest: Effort normal and breath sounds normal. No wheezing, rales or rhonchi. Abdominal: Soft, nondistended, tenderness  to palpation in the epigastrium, no rebound tenderness/guarding/rigidity Bowel sounds active throughout. There are no masses palpable. No hepatomegaly. Extremities: no edema Neurological: Alert and oriented to person place and time. Skin: Skin is warm and dry. No rashes noted. Psychiatric: Normal mood and affect. Behavior is normal.  CBC    Component Value Date/Time   WBC 5.5 09/12/2020 0945   WBC 2.4 (L) 05/21/2020 1225   RBC 5.01 09/12/2020 0945   RBC 5.19 (H) 05/21/2020 1225   HGB 11.0 (L) 09/12/2020 0945   HCT 37.4 09/12/2020 0945   PLT 360 09/12/2020 0945   MCV 75 (L) 09/12/2020 0945   MCH 22.0 (L)  09/12/2020 0945   MCH 22.4 (L) 05/21/2020 1225   MCHC 29.4 (L) 09/12/2020 0945   MCHC 30.1 05/21/2020 1225   RDW 16.2 (H) 09/12/2020 0945   LYMPHSABS 1.7 09/12/2020 0945   MONOABS 0.4 10/15/2019 1555   EOSABS 0.4 09/12/2020 0945   BASOSABS 0.1 09/12/2020 0945    CMP     Component Value Date/Time   NA 139 09/12/2020 0945   K 5.0 09/12/2020 0945   CL 103 09/12/2020 0945   CO2 21 09/12/2020 0945   GLUCOSE 93 09/12/2020 0945   GLUCOSE 89 10/15/2019 1555   BUN 13 09/12/2020 0945   CREATININE 0.82 09/12/2020 0945   CREATININE 0.71 08/25/2015 1111   CALCIUM 9.9 09/12/2020 0945   PROT 7.0 10/15/2019 1555   PROT 7.1 05/06/2019 1100   ALBUMIN 3.3 (L) 10/15/2019 1555   ALBUMIN 4.1 05/06/2019 1100   AST 21 10/15/2019 1555   ALT 25 10/15/2019 1555   ALKPHOS 69 10/15/2019 1555   BILITOT 0.5 10/15/2019 1555   BILITOT <0.2 05/06/2019 1100   GFRNONAA 68 02/15/2020 0936   GFRNONAA >89 08/25/2015 1111   GFRAA 78 02/15/2020 0936   GFRAA >89 08/25/2015 1111     ASSESSMENT AND PLAN: 51 year old female with chronic episodic abdominal pain of unclear etiology.  The episodes of pain are very similar to her symptoms she was having when she got her gallbladder out.  This would suggest maybe her pain is biliary related (retained stone, SOD).  She has had normal liver enzymes and had a CT scan in 2018 that did not show any evidence of biliary dilation or evidence of retained stone.  I recommended an upper endoscopy to further evaluate for peptic ulcer disease and reevaluate for H. pylori, but the patient was very hesitant to do this.  Therefore, I recommended we begin a trial of Prilosec daily.  This should help with her typical GERD symptoms, and may help reduce episodes of her epigastric pain if it is acid related.  I like to repeat a CMP and lipase given that her symptoms have worsened.  Regarding her anemia, would like to repeat a CBC and iron panel.  If still anemic, I think this would increase the  need to insist on endoscopic evaluation.  I would also recommend the patient undergo her initial average rescreening colonoscopy, but the patient did not want to pursue this either.  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   GERD -Prilosec 20 mg p.o. daily  Epigastric pain -CMP, lipase - Recommended EGD, declined by patient  Microcytic anemia - CBC, iron panel  CRC screening -Patient declined colonoscopy - We will readdress with patient at follow-up  Diarrhea,  suspect bile acid related -Cholestyramine 4 grams 1-2x/day  Follow-up in 6 to 8 weeks  Kaitlyn Wise E. Candis Schatz, MD [facility]  CC:  Charlott Rakes, MD

## 2021-01-18 ENCOUNTER — Other Ambulatory Visit: Payer: Self-pay

## 2021-01-19 LAB — IGA: Immunoglobulin A: 510 mg/dL — ABNORMAL HIGH (ref 47–310)

## 2021-01-19 LAB — TISSUE TRANSGLUTAMINASE, IGA: (tTG) Ab, IgA: <1 U/mL

## 2021-02-03 ENCOUNTER — Other Ambulatory Visit: Payer: Self-pay

## 2021-02-06 ENCOUNTER — Other Ambulatory Visit: Payer: Self-pay

## 2021-02-13 ENCOUNTER — Encounter: Payer: Self-pay | Admitting: Family Medicine

## 2021-02-13 ENCOUNTER — Other Ambulatory Visit: Payer: Self-pay

## 2021-02-13 ENCOUNTER — Ambulatory Visit: Payer: 59 | Attending: Family Medicine | Admitting: Family Medicine

## 2021-02-13 VITALS — BP 125/78 | HR 88 | Ht 62.0 in | Wt 165.2 lb

## 2021-02-13 DIAGNOSIS — E038 Other specified hypothyroidism: Secondary | ICD-10-CM

## 2021-02-13 DIAGNOSIS — Z131 Encounter for screening for diabetes mellitus: Secondary | ICD-10-CM

## 2021-02-13 DIAGNOSIS — L299 Pruritus, unspecified: Secondary | ICD-10-CM

## 2021-02-13 DIAGNOSIS — J328 Other chronic sinusitis: Secondary | ICD-10-CM | POA: Diagnosis not present

## 2021-02-13 DIAGNOSIS — N939 Abnormal uterine and vaginal bleeding, unspecified: Secondary | ICD-10-CM

## 2021-02-13 MED ORDER — CETIRIZINE HCL 10 MG PO TABS
10.0000 mg | ORAL_TABLET | Freq: Every day | ORAL | 1 refills | Status: DC
  Filled 2021-02-13: qty 30, 30d supply, fill #0

## 2021-02-13 MED ORDER — DEBROX 6.5 % OT SOLN
5.0000 [drp] | Freq: Two times a day (BID) | OTIC | 0 refills | Status: DC
Start: 2021-02-13 — End: 2022-03-28
  Filled 2021-02-13: qty 15, 30d supply, fill #0

## 2021-02-13 NOTE — Progress Notes (Signed)
Discuss vaginal bleeding. Refill of provera. Right ear pain.

## 2021-02-13 NOTE — Progress Notes (Signed)
Subjective:  Patient ID: Kaitlyn Wise, female    DOB: [birth year]  Age: 51 y.o. MRN: [id number]  CC: Hypothyroidism   HPI Kaitlyn Wise is a 51 y.o. year old female with a history of hypothyroidism, Depression, history of COVID in 05/2020 who presents today for a follow up visit.   Interval History: She had a visit with [facility] 3 weeks ago for evaluation of abdominal pain and GERD at which time she was placed on Prilosec.  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 She states she feels better on the medications presecribed by GYN  She states she feels good. Complains of R ear itch which has not responded to OTC drops.  Has throat sometimes feels scratchy as well. Also states her abnormal uterine bleed has returned. She was seen by GYN and she said she was given the option of conservative management versus surgery. Prescribed Aygestin which worked for her until recently. She is requesting refill.   She states she is no longer Depressed and feels very happy.  States in the last 7 to 8 years she does not remember being as happy.  She will be traveling to her home country in the next 2 months. Past Medical History:  Diagnosis Date   Dyslipidemia    Thyroid disease    Uterine fibroid    Vertigo     Past Surgical History:  Procedure Laterality Date   CHOLECYSTECTOMY     CHOLECYSTECTOMY      Family History  Problem Relation Age of Onset   Diabetes Mother    Hypertension Mother    Diabetes Father    Hypertension Father    Heart disease Brother     Allergies  Allergen Reactions   Fish Allergy Itching   Milk-Related Compounds Diarrhea and Itching    Outpatient Medications Prior to Visit  Medication Sig Dispense Refill   cholestyramine (QUESTRAN) 4 g packet Take 1 packet (4 g total) by mouth 2 (two) times daily. 60 each 1   dicyclomine (BENTYL) 10 MG capsule TAKE 1 CAPSULE (10 MG  TOTAL) BY MOUTH 2 (TWO) TIMES DAILY AS NEEDED FOR SPASMS. 60 capsule 2   levothyroxine (SYNTHROID) 137 MCG tablet Take 1 tablet (137 mcg total) by mouth daily. 30 tablet 6   omeprazole (PRILOSEC) 20 MG capsule Take 1 capsule (20 mg total) by mouth daily. 60 capsule 1   No facility-administered medications prior to visit.     ROS Review of Systems  Constitutional:  Negative for activity change, appetite change and fatigue.  HENT:  Negative for congestion, hearing loss, sinus pressure and sore throat.   Eyes:  Negative for visual disturbance.  Respiratory:  Negative for cough, chest tightness, shortness of breath and wheezing.   Cardiovascular:  Negative for chest pain and palpitations.  Gastrointestinal:  Negative for abdominal distention, abdominal pain and constipation.  Endocrine: Negative for polydipsia.  Genitourinary:  Negative for dysuria and frequency.  Musculoskeletal:  Negative for arthralgias and back pain.  Skin:  Negative for rash.  Neurological:  Negative for tremors, light-headedness and numbness.  Hematological:  Does not bruise/bleed easily.  Psychiatric/Behavioral:  Negative for agitation and behavioral problems.    Objective:  BP 125/78    Pulse 88    Ht 5\' 2"  (1.575 m)    Wt 165 lb 3.2 oz (74.9 kg)    SpO2 98%    BMI 30.22 kg/m   BP/Weight 02/13/2021 01/17/2021  9/62/9528  Systolic BP 413 244 010  Diastolic BP 78 60 75  Wt. (Lbs) 165.2 164 165.4  BMI 30.22 30 30.25  Some encounter information is confidential and restricted. Go to Review Flowsheets activity to see all data.      Physical Exam Constitutional:      Appearance: She is well-developed.  HENT:     Left Ear: Tympanic membrane normal.     Ears:     Comments: Minimal cerumen in right eustachian tube.  TM visible and normal Cardiovascular:     Rate and Rhythm: Normal rate.     Heart sounds: Normal heart sounds. No murmur heard. Pulmonary:     Effort: Pulmonary effort is normal.     Breath sounds:  Normal breath sounds. No wheezing or rales.  Chest:     Chest wall: No tenderness.  Abdominal:     General: Bowel sounds are normal. There is no distension.     Palpations: Abdomen is soft. There is no mass.     Tenderness: There is no abdominal tenderness.  Musculoskeletal:        General: Normal range of motion.     Right lower leg: No edema.     Left lower leg: No edema.  Neurological:     Mental Status: She is alert and oriented to person, place, and time.  Psychiatric:        Mood and Affect: Mood normal.    CMP Latest Ref Rng & Units 01/17/2021 09/12/2020 02/15/2020  Glucose 70 - 99 mg/dL 86 93 89  BUN 6 - 23 mg/dL 15 13 13   Creatinine 0.40 - 1.20 mg/dL 0.63 0.82 0.99  Sodium 135 - 145 mEq/L 138 139 141  Potassium 3.5 - 5.1 mEq/L 4.2 5.0 4.7  Chloride 96 - 112 mEq/L 105 103 106  CO2 19 - 32 mEq/L 27 21 22   Calcium 8.4 - 10.5 mg/dL 9.5 9.9 9.3  Total Protein 6.0 - 8.3 g/dL 7.2 - -  Total Bilirubin 0.2 - 1.2 mg/dL 0.2 - -  Alkaline Phos 39 - 117 U/L 126(H) - -  AST 0 - 37 U/L 14 - -  ALT 0 - 35 U/L 14 - -    Lipid Panel     Component Value Date/Time   CHOL 186 09/12/2020 0945   TRIG 100 09/12/2020 0945   HDL 49 09/12/2020 0945   CHOLHDL 3.8 09/12/2020 0945   CHOLHDL 2.9 08/25/2015 1111   VLDL 17 08/25/2015 1111   LDLCALC 119 (H) 09/12/2020 0945    CBC    Component Value Date/Time   WBC 6.6 01/17/2021 1510   RBC 4.51 01/17/2021 1510   HGB 10.2 (L) 01/17/2021 1510   HGB 11.0 (L) 09/12/2020 0945   HCT 33.2 (L) 01/17/2021 1510   HCT 37.4 09/12/2020 0945   PLT 284.0 01/17/2021 1510   PLT 360 09/12/2020 0945   MCV 73.5 (L) 01/17/2021 1510   MCV 75 (L) 09/12/2020 0945   MCH 22.0 (L) 09/12/2020 0945   MCH 22.4 (L) 05/21/2020 1225   MCHC 30.8 01/17/2021 1510   RDW 17.8 (H) 01/17/2021 1510   RDW 16.2 (H) 09/12/2020 0945   LYMPHSABS 2.0 01/17/2021 1510   LYMPHSABS 1.7 09/12/2020 0945   MONOABS 0.5 01/17/2021 1510   EOSABS 0.5 01/17/2021 1510   EOSABS 0.4  09/12/2020 0945   BASOSABS 0.1 01/17/2021 1510   BASOSABS 0.1 09/12/2020 0945    No results found for: HGBA1C  Lab Results  Component Value  Date   TSH 0.010 (L) 09/12/2020    Assessment & Plan:  1. Other specified hypothyroidism Suppressed TSH in 08/2020 Will check thyroid panel and adjust regimen accordingly - T4, free - TSH  2. Abnormal uterine bleeding Previously controlled but then symptoms returned She currently has some leftover Aygestin tablets Advised to reach out to GYN regarding recurrence of bleed for further management She will restart her Aygestin tablet  3. Ear itch Likely cerumen versus sinus etiology - cetirizine (ZYRTEC) 10 MG tablet; Take 1 tablet (10 mg total) by mouth daily.  Dispense: 30 tablet; Refill: 1 - carbamide peroxide (DEBROX) 6.5 % OTIC solution; Place 5 drops into the right ear 2 (two) times daily.  Dispense: 15 mL; Refill: 0  4. Other chronic sinusitis - cetirizine (ZYRTEC) 10 MG tablet; Take 1 tablet (10 mg total) by mouth daily.  Dispense: 30 tablet; Refill: 1  5. Screening for diabetes mellitus - Hemoglobin A1c   Health Care Maintenance: Has upcoming appointment for colonoscopy Meds ordered this encounter  Medications   cetirizine (ZYRTEC) 10 MG tablet    Sig: Take 1 tablet (10 mg total) by mouth daily.    Dispense:  30 tablet    Refill:  1   carbamide peroxide (DEBROX) 6.5 % OTIC solution    Sig: Place 5 drops into the right ear 2 (two) times daily.    Dispense:  15 mL    Refill:  0    Follow-up: Return in about 3 months (around 05/14/2021) for Chronic medical conditions.       Charlott Rakes, MD, FAAFP. [facility] [place], [place]   02/13/2021, 5:37 PM

## 2021-02-14 ENCOUNTER — Other Ambulatory Visit: Payer: Self-pay | Admitting: Family Medicine

## 2021-02-14 ENCOUNTER — Other Ambulatory Visit: Payer: Self-pay

## 2021-02-14 DIAGNOSIS — E038 Other specified hypothyroidism: Secondary | ICD-10-CM

## 2021-02-14 LAB — TSH: TSH: 3.02 u[IU]/mL (ref 0.450–4.500)

## 2021-02-14 LAB — T4, FREE: Free T4: 1.34 ng/dL (ref 0.82–1.77)

## 2021-02-14 LAB — HEMOGLOBIN A1C
Est. average glucose Bld gHb Est-mCnc: 123 mg/dL
Hgb A1c MFr Bld: 5.9 % — ABNORMAL HIGH (ref 4.8–5.6)

## 2021-02-14 MED ORDER — LEVOTHYROXINE SODIUM 137 MCG PO TABS
137.0000 ug | ORAL_TABLET | Freq: Every day | ORAL | 1 refills | Status: DC
  Filled 2021-02-14 – 2021-03-10 (×2): qty 90, 90d supply, fill #0
  Filled 2021-06-12: qty 90, 90d supply, fill #1

## 2021-02-16 ENCOUNTER — Telehealth: Payer: Self-pay

## 2021-02-16 NOTE — Telephone Encounter (Signed)
-----   Message from Charlott Rakes, MD sent at 02/14/2021  1:50 PM EST ----- Please inform hypothyroid labs are normal. Labs reveal prediabetes with an A1c of 5.9.  Working on a low carbohydrate diet, exercise, weight loss is recommended in order to prevent progression to type 2 diabetes mellitus.

## 2021-02-16 NOTE — Progress Notes (Signed)
Ms. Kaitlyn Wise,  Your labs showed persistent iron deficiency anemia.  Iron deficiency anemia can be from multiple causes, including benign processes such as menstrual blood loss, or serious problems such as colon cancer or stomach cancer.  I know that you were not eager to undergo a colonoscopy or an upper endoscopy, but I would recommend you reconsider.  I would to follow up with you in the clinic to see how your symptoms are doing and to further discuss doing these procedures to figure out why your blood counts are low.  [place],  Can you get Kaitlyn Wise in for a follow up clinic appointment?

## 2021-02-16 NOTE — Telephone Encounter (Signed)
Patient was called and a voicemail was left informing patient to return phone call for lab results.   CRM Created

## 2021-03-10 ENCOUNTER — Other Ambulatory Visit: Payer: Self-pay

## 2021-03-15 ENCOUNTER — Ambulatory Visit: Payer: 59 | Admitting: Gastroenterology

## 2021-04-03 ENCOUNTER — Other Ambulatory Visit: Payer: Self-pay

## 2021-04-03 ENCOUNTER — Encounter: Payer: Self-pay | Admitting: Gastroenterology

## 2021-04-03 ENCOUNTER — Ambulatory Visit (INDEPENDENT_AMBULATORY_CARE_PROVIDER_SITE_OTHER): Payer: 59 | Admitting: Gastroenterology

## 2021-04-03 VITALS — BP 104/78 | HR 81 | Ht 62.0 in | Wt 171.0 lb

## 2021-04-03 DIAGNOSIS — Z1212 Encounter for screening for malignant neoplasm of rectum: Secondary | ICD-10-CM

## 2021-04-03 DIAGNOSIS — Z1211 Encounter for screening for malignant neoplasm of colon: Secondary | ICD-10-CM | POA: Diagnosis not present

## 2021-04-03 DIAGNOSIS — K219 Gastro-esophageal reflux disease without esophagitis: Secondary | ICD-10-CM

## 2021-04-03 DIAGNOSIS — R197 Diarrhea, unspecified: Secondary | ICD-10-CM

## 2021-04-03 DIAGNOSIS — K9189 Other postprocedural complications and disorders of digestive system: Secondary | ICD-10-CM | POA: Diagnosis not present

## 2021-04-03 DIAGNOSIS — D500 Iron deficiency anemia secondary to blood loss (chronic): Secondary | ICD-10-CM

## 2021-04-03 MED ORDER — SUTAB 1479-225-188 MG PO TABS
1.0000 | ORAL_TABLET | Freq: Once | ORAL | 0 refills | Status: AC
  Filled 2021-04-03: qty 24, 1d supply, fill #0

## 2021-04-03 MED ORDER — DICYCLOMINE HCL 10 MG PO CAPS
ORAL_CAPSULE | ORAL | 2 refills | Status: DC
  Filled 2021-04-03: qty 60, 30d supply, fill #0
  Filled 2021-06-06: qty 60, 30d supply, fill #1
  Filled 2021-07-28: qty 60, 30d supply, fill #2

## 2021-04-03 NOTE — Progress Notes (Signed)
? ?HPI : Kaitlyn Wise is a very pleasant 51 year old female with a history of cholecystectomy who I initially saw in January of this year with numerous GI symptoms to include epigastric pain, nausea, vomiting, heartburn as well as chronic diarrhea.  She primarily speaks Arabic, so she is accompanied by a Optometrist in clinic today.  She was noted to have iron deficiency anemia at that time.  Lab evaluation was negative for H. pylori (breath test) and celiac disease ?At that visit, she was started on omeprazole and cholestyramine.  It was recommended she undergo an EGD and colonoscopy because of her symptoms and iron deficiency anemia, but she declined at that time. ?Today, she reports that all her symptoms are either resolved or greatly improved.  She still has occasional epigastric pain.  Whereas before it was on a daily basis, now it happens maybe once a month.  She had an episode last night, and took omeprazole, and within 30 minutes the pain was gone.  She has been taking omeprazole as needed recently.  Her nausea, vomiting and heartburn are also essentially resolved.  She has been having normal bowel movements, and is no longer bothered by diarrhea.  She took the cholestyramine for a few weeks, but has since stopped. ?We did not discuss this at her initial visit, but the patient reports that she has been having heavy menstrual bleeding for several years now.  She has been evaluated by gynecology and was recommended to undergo a hysterectomy, but she is looking to have this done in [place], where she has more family support to help care for her postoperatively.  She is taking norethindrone which helps limit the severity of her menstrual blood loss. ?Patient reports that she will be going back to [place] later this year for a month or so. ? ?Past Medical History:  ?Diagnosis Date  ? Dyslipidemia   ? Thyroid disease   ? Uterine fibroid   ? Vertigo   ? ? ? ?Past Surgical History:  ?Procedure Laterality Date  ?  CHOLECYSTECTOMY    ? CHOLECYSTECTOMY    ? ?Family History  ?Problem Relation Age of Onset  ? Diabetes Mother   ? Hypertension Mother   ? Diabetes Father   ? Hypertension Father   ? Heart disease Brother   ? ?Social History  ? ?Tobacco Use  ? Smoking status: Never  ? Smokeless tobacco: Never  ?Vaping Use  ? Vaping Use: Never used  ?Substance Use Topics  ? Alcohol use: No  ? Drug use: No  ? ?Current Outpatient Medications  ?Medication Sig Dispense Refill  ? carbamide peroxide (DEBROX) 6.5 % OTIC solution Place 5 drops into the right ear 2 (two) times daily. 15 mL 0  ? cetirizine (ZYRTEC) 10 MG tablet Take 1 tablet (10 mg total) by mouth daily. 30 tablet 1  ? cholestyramine (QUESTRAN) 4 g packet Take 1 packet (4 g total) by mouth 2 (two) times daily. 60 each 1  ? dicyclomine (BENTYL) 10 MG capsule TAKE 1 CAPSULE (10 MG TOTAL) BY MOUTH 2 (TWO) TIMES DAILY AS NEEDED FOR SPASMS. 60 capsule 2  ? levothyroxine (SYNTHROID) 137 MCG tablet Take 1 tablet (137 mcg total) by mouth daily. 90 tablet 1  ? omeprazole (PRILOSEC) 20 MG capsule Take 1 capsule (20 mg total) by mouth daily. 60 capsule 1  ? ?No current facility-administered medications for this visit.  ? ?Allergies  ?Allergen Reactions  ? Fish Allergy Itching  ? Milk-Related Compounds Diarrhea and Itching  ? ? ? ?  Review of Systems: ?All systems reviewed and negative except where noted in HPI.  ? ? ?No results found. ? ?Physical Exam: ?Ht '5\' 2"'$  (1.575 m)   Wt 171 lb (77.6 kg)   BMI 31.28 kg/m?  ?Constitutional: Pleasant,well-developed, [place] female in no acute distress.  Accompanied by medical translator ?Neurological: Alert and oriented to person place and time. ?Skin: Skin is warm and dry. No rashes noted. ?Psychiatric: Normal mood and affect. Behavior is normal. ? ?CBC ?   ?Component Value Date/Time  ? WBC 6.6 01/17/2021 1510  ? RBC 4.51 01/17/2021 1510  ? HGB 10.2 (L) 01/17/2021 1510  ? HGB 11.0 (L) 09/12/2020 0945  ? HCT 33.2 (L) 01/17/2021 1510  ? HCT 37.4  09/12/2020 0945  ? PLT 284.0 01/17/2021 1510  ? PLT 360 09/12/2020 0945  ? MCV 73.5 (L) 01/17/2021 1510  ? MCV 75 (L) 09/12/2020 0945  ? MCH 22.0 (L) 09/12/2020 0945  ? MCH 22.4 (L) 05/21/2020 1225  ? MCHC 30.8 01/17/2021 1510  ? RDW 17.8 (H) 01/17/2021 1510  ? RDW 16.2 (H) 09/12/2020 0945  ? LYMPHSABS 2.0 01/17/2021 1510  ? LYMPHSABS 1.7 09/12/2020 0945  ? MONOABS 0.5 01/17/2021 1510  ? EOSABS 0.5 01/17/2021 1510  ? EOSABS 0.4 09/12/2020 0945  ? BASOSABS 0.1 01/17/2021 1510  ? BASOSABS 0.1 09/12/2020 0945  ? ? ?CMP  ?   ?Component Value Date/Time  ? NA 138 01/17/2021 1510  ? NA 139 09/12/2020 0945  ? K 4.2 01/17/2021 1510  ? CL 105 01/17/2021 1510  ? CO2 27 01/17/2021 1510  ? GLUCOSE 86 01/17/2021 1510  ? BUN 15 01/17/2021 1510  ? BUN 13 09/12/2020 0945  ? CREATININE 0.63 01/17/2021 1510  ? CREATININE 0.71 08/25/2015 1111  ? CALCIUM 9.5 01/17/2021 1510  ? PROT 7.2 01/17/2021 1510  ? PROT 7.1 05/06/2019 1100  ? ALBUMIN 3.8 01/17/2021 1510  ? ALBUMIN 4.1 05/06/2019 1100  ? AST 14 01/17/2021 1510  ? ALT 14 01/17/2021 1510  ? ALKPHOS 126 (H) 01/17/2021 1510  ? BILITOT 0.2 01/17/2021 1510  ? BILITOT <0.2 05/06/2019 1100  ? GFRNONAA 68 02/15/2020 0936  ? [facility] >89 08/25/2015 1111  ? GFRAA 78 02/15/2020 0936  ? GFRAA >89 08/25/2015 1111  ? ?Component Ref Range & Units 7 yr ago 8 yr ago  ?WBC 4.0 - 10.5 K/uL 6.2  5.9   ?RBC 3.87 - 5.11 MIL/uL 4.49  4.64   ?Hemoglobin 12.0 - 15.0 g/dL 12.9  13.1   ?HCT 36.0 - 46.0 % 38.5  39.4   ?MCV 78.0 - 100.0 fL 85.7  84.9   ?MCH 26.0 - 34.0 pg 28.7  28.2   ?MCHC 30.0 - 36.0 g/dL 33.5  33.2   ?RDW 11.5 - 15.5 % 14.0  14.3   ?Platelets 150 - 400 K/uL 256  337   ?Neutrophils Relative % 43 - 77 % 43  58   ?Neutro Abs 1.7 - 7.7 K/uL 2.7  3.4   ?Lymphocytes Relative 12 - 46 % 45  33   ?Lymphs Abs 0.7 - 4.0 K/uL 2.8  1.9   ?Monocytes Relative 3 - 12 % 5  5   ?Monocytes Absolute 0.1 - 1.0 K/uL 0.3  0.3   ?Eosinophils Relative 0 - 5 % 6 High   3   ?Eosinophils Absolute 0.0 - 0.7 K/uL 0.4   0.2   ?Basophils Relative 0 - 1 % 1  1   ?Basophils Absolute 0.0 - 0.1 K/uL 0.1  0.1   ? ?Component Ref Range & Units 2 mo ago 1 yr ago  ?Ferritin 10.0 - 291.0 ng/mL 3.6 Low   20 R   ? ?Component Ref Range & Units 2 mo ago 1 yr ago  ?Iron 42 - 145 ug/dL 25 Low   18 Low  R   ?Transferrin 212.0 - 360.0 mg/dL 363.0 High     ?Saturation Ratios 20.0 - 50.0 % 4.9 Low     ?TIBC 250.0 - 450.0 mcg/dL 508.2 High   390 R   ? ?Component Ref Range & Units 2 mo ago  ?(tTG) Ab, IgA U/mL <1.0   ?Comment: Value          Interpretation  ?-----          --------------  ?<15.0          Antibody not detected  ?> or = 15.0    Antibody detected   ? ?Component Ref Range & Units 5 mo ago 3 yr ago  ?H pylori Breath Test Negative Negative  Negative   ? ?ASSESSMENT AND PLAN: ?51 year old female with GERD symptoms, significantly improved with once daily omeprazole, now using as needed.  Also with chronic diarrhea following a cholecystectomy, currently resolved.  No longer needing cholestyramine.  She has iron deficiency anemia which is most likely secondary to heavy uterine bleeding and has been recommended to undergo hysterectomy.  Testing has been negative for H. pylori and celiac disease.  She has not undergone any colon cancer screening and we discussed the different options for colon cancer screening today.  After discussing these options, the patient elected to pursue a colonoscopy.  She requested to have this done after Ramadan is complete, which will be in late April. ?Given her infrequent GERD symptoms, and responsiveness to as needed omeprazole, I recommend she continue this therapy as needed. ?Given resolution of her diarrhea, recommended she only take the cholestyramine as needed.  She did request a refill of Bentyl, which she says does help with her abdominal pain sometimes. ? ?Iron deficiency anemia, likely secondary to uterine bleeding ?- Recommend following up with gynecology to pursue hysterectomy ? ?CRC  screening ?-Colonoscopy ? ?GERD, improved ?-Continue as needed omeprazole ? ?Diarrhea, postcholecystectomy ?- Continue as needed cholestyramine ? ?The details, risks (including bleeding, perforation, infection, missed lesions, medication r

## 2021-04-03 NOTE — Patient Instructions (Signed)
If you are age 49 or older, your body mass index should be between 23-30. Your Body mass index is 31.28 kg/m?Marland Kitchen If this is out of the aforementioned range listed, please consider follow up with your Primary Care Provider. ? ?If you are age 20 or younger, your body mass index should be between 19-25. Your Body mass index is 31.28 kg/m?Marland Kitchen If this is out of the aformentioned range listed, please consider follow up with your Primary Care Provider.  ? ?You have been scheduled for a colonoscopy. Please follow written instructions given to you at your visit today.  ?Please pick up your prep supplies at the pharmacy within the next 1-3 days. ?If you use inhalers (even only as needed), please bring them with you on the day of your procedure. ? ? ?The  GI providers would like to encourage you to use [facility] to communicate with providers for non-urgent requests or questions.  Due to long hold times on the telephone, sending your provider a message by [facility] may be a faster and more efficient way to get a response.  Please allow 48 business hours for a response.  Please remember that this is for non-urgent requests.  ? ?It was a pleasure to see you today! ? ?Thank you for trusting me with your gastrointestinal care!   ? ?Scott E.Candis Schatz, MD ? ?

## 2021-04-04 ENCOUNTER — Other Ambulatory Visit: Payer: Self-pay

## 2021-04-07 ENCOUNTER — Other Ambulatory Visit: Payer: Self-pay

## 2021-05-17 ENCOUNTER — Encounter: Payer: Self-pay | Admitting: Gastroenterology

## 2021-05-17 ENCOUNTER — Ambulatory Visit (AMBULATORY_SURGERY_CENTER): Payer: 59 | Admitting: Gastroenterology

## 2021-05-17 VITALS — BP 106/62 | HR 64 | Temp 97.3°F | Resp 16 | Ht 62.0 in | Wt 171.0 lb

## 2021-05-17 DIAGNOSIS — Z1212 Encounter for screening for malignant neoplasm of rectum: Secondary | ICD-10-CM

## 2021-05-17 DIAGNOSIS — K219 Gastro-esophageal reflux disease without esophagitis: Secondary | ICD-10-CM

## 2021-05-17 DIAGNOSIS — D123 Benign neoplasm of transverse colon: Secondary | ICD-10-CM

## 2021-05-17 DIAGNOSIS — Z1211 Encounter for screening for malignant neoplasm of colon: Secondary | ICD-10-CM | POA: Diagnosis not present

## 2021-05-17 DIAGNOSIS — D500 Iron deficiency anemia secondary to blood loss (chronic): Secondary | ICD-10-CM

## 2021-05-17 MED ORDER — SODIUM CHLORIDE 0.9 % IV SOLN
500.0000 mL | Freq: Once | INTRAVENOUS | Status: DC
Start: 2021-05-17 — End: 2021-05-17

## 2021-05-17 NOTE — Progress Notes (Signed)
[facility] History and Physical ? ? ?Primary Care Physician:  Charlott Rakes, MD ? ? ?Reason for Procedure:   Colon cancer screening ? ?Plan:    Screening colonoscopy ? ? ? ? ?HPI: Ilyana Ania Levay is a 51 y.o. female undergoing initial average risk screening colonoscopy.  She has no family history of colon cancer and no chronic GI symptoms.  She had problems with abdominal pain and diarrhea, but these symptoms have resolved.  She has chronic iron deficiency anemia secondary to menstrual blood loss. ? ? ?Past Medical History:  ?Diagnosis Date  ? Allergy   ? Anemia   ? Anxiety   ? Arthritis   ? Depression   ? Dyslipidemia   ? GERD (gastroesophageal reflux disease)   ? Thyroid disease   ? Uterine fibroid   ? Vertigo   ? ? ?Past Surgical History:  ?Procedure Laterality Date  ? CHOLECYSTECTOMY    ? ? ?Prior to Admission medications   ?Medication Sig Start Date End Date Taking? Authorizing Provider  ?levothyroxine (SYNTHROID) 137 MCG tablet Take 1 tablet (137 mcg total) by mouth daily. 02/14/21  Yes Charlott Rakes, MD  ?carbamide peroxide (DEBROX) 6.5 % OTIC solution Place 5 drops into the right ear 2 (two) times daily. ?Patient not taking: Reported on 05/17/2021 02/13/21   Charlott Rakes, MD  ?cetirizine (ZYRTEC) 10 MG tablet Take 1 tablet (10 mg total) by mouth daily. ?Patient not taking: Reported on 05/17/2021 02/13/21   Charlott Rakes, MD  ?cholestyramine (QUESTRAN) 4 g packet Take 1 packet (4 g total) by mouth 2 (two) times daily. ?Patient not taking: Reported on 04/03/2021 01/17/21   Daryel November, MD  ?dicyclomine (BENTYL) 10 MG capsule TAKE 1 CAPSULE (10 MG TOTAL) BY MOUTH 2 (TWO) TIMES DAILY AS NEEDED FOR SPASMS. ?Patient not taking: Reported on 05/17/2021 04/03/21 04/03/22  Daryel November, MD  ?norethindrone (AYGESTIN) 5 MG tablet Take 5 mg by mouth in the morning and at bedtime. ?Patient not taking: Reported on 05/17/2021 02/23/21   [provider]  ?omeprazole (PRILOSEC) 20 MG  capsule Take 1 capsule (20 mg total) by mouth daily. ?Patient taking differently: Take 20 mg by mouth daily as needed. 01/17/21   Daryel November, MD  ?FLUoxetine (PROZAC) 20 MG capsule Take 1 capsule (20 mg total) by mouth daily. ?Patient not taking: No sig reported 12/30/19 02/15/20  Charlott Rakes, MD  ?medroxyPROGESTERone (PROVERA) 10 MG tablet Take 1 tablet (10 mg total) by mouth daily. 09/04/19 09/08/19  Charlott Rakes, MD  ?prazosin (MINIPRESS) 1 MG capsule Take 1 capsule (1 mg total) by mouth at bedtime. 10/22/17 06/03/19  Charlott Rakes, MD  ?topiramate (TOPAMAX) 50 MG tablet Take 1 tablet (50 mg total) by mouth 2 (two) times daily. ?Patient not taking: Reported on 04/14/2016 11/29/15 06/03/19  Charlott Rakes, MD  ? ? ?Current Outpatient Medications  ?Medication Sig Dispense Refill  ? levothyroxine (SYNTHROID) 137 MCG tablet Take 1 tablet (137 mcg total) by mouth daily. 90 tablet 1  ? carbamide peroxide (DEBROX) 6.5 % OTIC solution Place 5 drops into the right ear 2 (two) times daily. (Patient not taking: Reported on 05/17/2021) 15 mL 0  ? cetirizine (ZYRTEC) 10 MG tablet Take 1 tablet (10 mg total) by mouth daily. (Patient not taking: Reported on 05/17/2021) 30 tablet 1  ? cholestyramine (QUESTRAN) 4 g packet Take 1 packet (4 g total) by mouth 2 (two) times daily. (Patient not taking: Reported on 04/03/2021) 60 each 1  ? dicyclomine (  BENTYL) 10 MG capsule TAKE 1 CAPSULE (10 MG TOTAL) BY MOUTH 2 (TWO) TIMES DAILY AS NEEDED FOR SPASMS. (Patient not taking: Reported on 05/17/2021) 60 capsule 2  ? norethindrone (AYGESTIN) 5 MG tablet Take 5 mg by mouth in the morning and at bedtime. (Patient not taking: Reported on 05/17/2021)    ? omeprazole (PRILOSEC) 20 MG capsule Take 1 capsule (20 mg total) by mouth daily. (Patient taking differently: Take 20 mg by mouth daily as needed.) 60 capsule 1  ? ?Current Facility-Administered Medications  ?Medication Dose Route Frequency Provider Last Rate Last Admin  ? 0.9 %  sodium  chloride infusion  500 mL Intravenous Once Daryel November, MD      ? ? ?Allergies as of 05/17/2021 - Review Complete 05/17/2021  ?Allergen Reaction Noted  ? Eggs or egg-derived products  05/17/2021  ? Fish allergy Itching 11/25/2019  ? Milk-related compounds Diarrhea and Itching 11/25/2019  ? ? ?Family History  ?Problem Relation Age of Onset  ? Diabetes Mother   ? Hypertension Mother   ? Diabetes Father   ? Hypertension Father   ? Heart disease Brother   ? Colon cancer Neg Hx   ? Esophageal cancer Neg Hx   ? Stomach cancer Neg Hx   ? ? ?Social History  ? ?Socioeconomic History  ? Marital status: Widowed  ?  Spouse name: Not on file  ? Number of children: Not on file  ? Years of education: Not on file  ? Highest education level: Not on file  ?Occupational History  ? Not on file  ?Tobacco Use  ? Smoking status: Never  ? Smokeless tobacco: Never  ?Vaping Use  ? Vaping Use: Never used  ?Substance and Sexual Activity  ? Alcohol use: No  ? Drug use: No  ? Sexual activity: Not Currently  ?Other Topics Concern  ? Not on file  ?Social History Narrative  ? Not on file  ? ?Social Determinants of Health  ? ?Financial Resource Strain: Not on file  ?Food Insecurity: Not on file  ?Transportation Needs: Not on file  ?Physical Activity: Not on file  ?Stress: Not on file  ?Social Connections: Not on file  ?Intimate Partner Violence: Not on file  ? ? ?Review of Systems: ? ?All other review of systems negative except as mentioned in the HPI. ? ?Physical Exam: ?Vital signs ?BP 122/67   Pulse 82   Temp (!) 97.3 ?F (36.3 ?C) (Skin)   Resp (!) 24   Ht '5\' 2"'$  (1.575 m)   Wt 171 lb (77.6 kg)   SpO2 100%   BMI 31.28 kg/m?  ? ?General:   Alert,  Well-developed, well-nourished, pleasant and cooperative in NAD ?Airway:  Mallampati 3 ?Lungs:  Clear throughout to auscultation.   ?Heart:  Regular rate and rhythm; no murmurs, clicks, rubs,  or gallops. ?Abdomen:  Soft, nontender and nondistended. Normal bowel sounds.   ?Neuro/Psych:   Normal mood and affect. A and O x 3 ? ? ?Stryder Poitra E. Candis Schatz, MD ?[facility] ? ?

## 2021-05-17 NOTE — Op Note (Signed)
[place] ?Patient Name: Kaitlyn Wise ?Procedure Date: 05/17/2021 10:32 AM ?MRN: [id number] ?Endoscopist: Devion Chriscoe E. Candis Schatz , MD ?Age: 51 ?Referring MD:  ?Date of Birth: Nov 25, 1970 ?Gender: Female ?Account #: [id number] ?Procedure:                Colonoscopy ?Indications:              Screening for colorectal malignant neoplasm, This  ?                          is the patient's first colonoscopy ?Medicines:                Monitored Anesthesia Care ?Procedure:                Pre-Anesthesia Assessment: ?                          - Prior to the procedure, a History and Physical  ?                          was performed, and patient medications and  ?                          allergies were reviewed. The patient's tolerance of  ?                          previous anesthesia was also reviewed. The risks  ?                          and benefits of the procedure and the sedation  ?                          options and risks were discussed with the patient.  ?                          All questions were answered, and informed consent  ?                          was obtained. Prior Anticoagulants: The patient has  ?                          taken no previous anticoagulant or antiplatelet  ?                          agents. ASA Grade Assessment: II - A patient with  ?                          mild systemic disease. After reviewing the risks  ?                          and benefits, the patient was deemed in  ?                          satisfactory condition to undergo the procedure. ?  After obtaining informed consent, the colonoscope  ?                          was passed under direct vision. Throughout the  ?                          procedure, the patient's blood pressure, pulse, and  ?                          oxygen saturations were monitored continuously. The  ?                          CF [id number] #[id number] was introduced through the anus  ?                          and advanced to the  the terminal ileum, with  ?                          identification of the appendiceal orifice and IC  ?                          valve. The colonoscopy was performed without  ?                          difficulty. The patient tolerated the procedure  ?                          well. The quality of the bowel preparation was  ?                          excellent. The terminal ileum, ileocecal valve,  ?                          appendiceal orifice, and rectum were photographed. ?Scope In: 10:40:17 AM ?Scope Out: 10:53:22 AM ?Scope Withdrawal Time: 0 hours 8 minutes 57 seconds  ?Total Procedure Duration: 0 hours 13 minutes 5 seconds  ?Findings:                 The perianal and digital rectal examinations were  ?                          normal. Pertinent negatives include normal  ?                          sphincter tone and no palpable rectal lesions. ?                          A 6 mm polyp was found in the distal transverse  ?                          colon. The polyp was semi-pedunculated. The polyp  ?                          was removed with a cold snare.  Resection and  ?                          retrieval were complete. Estimated blood loss was  ?                          minimal. ?                          The exam was otherwise normal throughout the  ?                          examined colon. ?                          The terminal ileum appeared normal. ?                          Non-bleeding internal hemorrhoids were found during  ?                          retroflexion. The hemorrhoids were Grade I  ?                          (internal hemorrhoids that do not prolapse). ?                          No additional abnormalities were found on  ?                          retroflexion. ?Complications:            No immediate complications. ?Estimated Blood Loss:     Estimated blood loss was minimal. ?Impression:               - One 6 mm polyp in the distal transverse colon,  ?                          removed with  a cold snare. Resected and retrieved. ?                          - The examined portion of the ileum was normal. ?                          - Non-bleeding internal hemorrhoids. ?Recommendation:           - Patient has a contact number available for  ?                          emergencies. The signs and symptoms of potential  ?                          delayed complications were discussed with the  ?                          patient. Return to normal activities tomorrow.  ?  Written discharge instructions were provided to the  ?                          patient. ?                          - Resume previous diet. ?                          - Continue present medications. ?                          - Await pathology results. ?                          - Repeat colonoscopy (date not yet determined) for  ?                          surveillance based on pathology results. ?Corissa Oguinn E. Candis Schatz, MD ?05/17/2021 11:00:14 AM ?This report has been signed electronically. ?

## 2021-05-17 NOTE — Progress Notes (Signed)
Called to room to assist during endoscopic procedure.  Patient ID and intended procedure confirmed with present staff. Received instructions for my participation in the procedure from the performing physician.  

## 2021-05-17 NOTE — Progress Notes (Signed)
Report to PACU, RN, vss, BBS= Clear.  

## 2021-05-17 NOTE — Progress Notes (Signed)
Pt's states no medical or surgical changes since previsit or office visit. VS assessed by C.W 

## 2021-05-17 NOTE — Patient Instructions (Signed)
Discharge instructions given. ?Handouts on polyps and Hemorrhoids. ?Resume previous medications. ?YOU HAD AN ENDOSCOPIC PROCEDURE TODAY AT THE [facility]:   Refer to the procedure report that was given to you for any specific questions about what was found during the examination.  If the procedure report does not answer your questions, please call your gastroenterologist to clarify.  If you requested that your care partner not be given the details of your procedure findings, then the procedure report has been included in a sealed envelope for you to review at your convenience later. ? ?YOU SHOULD EXPECT: Some feelings of bloating in the abdomen. Passage of more gas than usual.  Walking can help get rid of the air that was put into your GI tract during the procedure and reduce the bloating. If you had a lower endoscopy (such as a colonoscopy or flexible sigmoidoscopy) you may notice spotting of blood in your stool or on the toilet paper. If you underwent a bowel prep for your procedure, you may not have a normal bowel movement for a few days. ? ?Please Note:  You might notice some irritation and congestion in your nose or some drainage.  This is from the oxygen used during your procedure.  There is no need for concern and it should clear up in a day or so. ? ?SYMPTOMS TO REPORT IMMEDIATELY: ? ?Following lower endoscopy (colonoscopy or flexible sigmoidoscopy): ? Excessive amounts of blood in the stool ? Significant tenderness or worsening of abdominal pains ? Swelling of the abdomen that is new, acute ? Fever of 100?F or higher ? ? ?For urgent or emergent issues, a gastroenterologist can be reached at any hour by calling [phone]. ?Do not use MyChart messaging for urgent concerns.  ? ? ?DIET:  We do recommend a small meal at first, but then you may proceed to your regular diet.  Drink plenty of fluids but you should avoid alcoholic beverages for 24 hours. ? ?ACTIVITY:  You should plan to take it  easy for the rest of today and you should NOT DRIVE or use heavy machinery until tomorrow (because of the sedation medicines used during the test).   ? ?FOLLOW UP: ?Our staff will call the number listed on your records 48-72 hours following your procedure to check on you and address any questions or concerns that you may have regarding the information given to you following your procedure. If we do not reach you, we will leave a message.  We will attempt to reach you two times.  During this call, we will ask if you have developed any symptoms of COVID 19. If you develop any symptoms (ie: fever, flu-like symptoms, shortness of breath, cough etc.) before then, please call [phone].  If you test positive for Covid 19 in the 2 weeks post procedure, please call and report this information to us.   ? ?If any biopsies were taken you will be contacted by phone or by letter within the next 1-3 weeks.  Please call us at [phone] if you have not heard about the biopsies in 3 weeks.  ? ? ?SIGNATURES/CONFIDENTIALITY: ?You and/or your care partner have signed paperwork which will be entered into your electronic medical record.  These signatures attest to the fact that that the information above on your After Visit Summary has been reviewed and is understood.  Full responsibility of the confidentiality of this discharge information lies with you and/or your care-partner.  ?

## 2021-05-19 ENCOUNTER — Telehealth: Payer: Self-pay | Admitting: *Deleted

## 2021-05-19 NOTE — Telephone Encounter (Signed)
No answer on first attempt follow up call. Left message.  ?

## 2021-05-22 ENCOUNTER — Ambulatory Visit (HOSPITAL_COMMUNITY)
Admission: EM | Admit: 2021-05-22 | Discharge: 2021-05-22 | Disposition: A | Discharge status: Home / Self Care | Payer: 59 | Attending: Internal Medicine | Admitting: Internal Medicine

## 2021-05-22 ENCOUNTER — Encounter (HOSPITAL_COMMUNITY): Payer: Self-pay | Admitting: Emergency Medicine

## 2021-05-22 DIAGNOSIS — R42 Dizziness and giddiness: Secondary | ICD-10-CM | POA: Insufficient documentation

## 2021-05-22 LAB — BASIC METABOLIC PANEL
Anion gap: 6 (ref 5–15)
BUN: 17 mg/dL (ref 6–20)
CO2: 25 mmol/L (ref 22–32)
Calcium: 9.1 mg/dL (ref 8.9–10.3)
Chloride: 107 mmol/L (ref 98–111)
Creatinine, Ser: 0.74 mg/dL (ref 0.44–1.00)
GFR, Estimated: >60 mL/min (ref >60)
Glucose, Bld: 102 mg/dL — ABNORMAL HIGH (ref 70–99)
Potassium: 4.3 mmol/L (ref 3.5–5.1)
Sodium: 138 mmol/L (ref 135–145)

## 2021-05-22 LAB — CBC
HCT: 37.7 % (ref 36.0–46.0)
Hemoglobin: 11.4 g/dL — ABNORMAL LOW (ref 12.0–15.0)
MCH: 23.4 pg — ABNORMAL LOW (ref 26.0–34.0)
MCHC: 30.2 g/dL (ref 30.0–36.0)
MCV: 77.3 fL — ABNORMAL LOW (ref 80.0–100.0)
Platelets: 300 10*3/uL (ref 150–400)
RBC: 4.88 MIL/uL (ref 3.87–5.11)
RDW: 18.3 % — ABNORMAL HIGH (ref 11.5–15.5)
WBC: 5.9 10*3/uL (ref 4.0–10.5)
nRBC: 0 % (ref 0.0–0.2)

## 2021-05-22 LAB — TSH: TSH: 0.01 u[IU]/mL — ABNORMAL LOW (ref 0.350–4.500)

## 2021-05-22 MED ORDER — ACETAMINOPHEN 325 MG PO TABS
975.0000 mg | ORAL_TABLET | Freq: Once | ORAL | Status: AC
  Administered 2021-05-22: 975 mg via ORAL

## 2021-05-22 MED ORDER — ACETAMINOPHEN 500 MG PO TABS
500.0000 mg | ORAL_TABLET | Freq: Four times a day (QID) | ORAL | 0 refills | Status: DC | PRN
  Filled 2021-05-22: qty 30, 8d supply, fill #0

## 2021-05-22 MED ORDER — ACETAMINOPHEN 325 MG PO TABS
ORAL_TABLET | ORAL | Status: AC
Start: 2021-05-22 — End: ?
  Filled 2021-05-22: qty 3

## 2021-05-22 MED ORDER — ACETAMINOPHEN 500 MG PO TABS
1000.0000 mg | ORAL_TABLET | Freq: Four times a day (QID) | ORAL | 0 refills | Status: DC | PRN
  Filled 2021-05-22: qty 30, 4d supply, fill #0

## 2021-05-22 NOTE — Discharge Instructions (Addendum)
????? ?? ????? ????? ????? ???? ??????. ???? ????? ????? ????? ?????? ????? ?????? ????? ?????? ??????? ???? ????? ?? ????. ???? ??????? ????? ??????? ??????? ???? ?????? ???? ??????? ??????? ?????. ???? ???? ??????? ????? ?????? ??? ???? ??????????? ?????????. ??? ???? ???? ????? ??????? ?????. ?????? ????? ???? ??????? ?? ??????? ??? ??????? ???? ??????? ???   MyChart ???? ?????? ?????? ??? ???? ?????? ??? ??????. ????? ????? ???????? ????? ????? ?? ??????. ????? ??????? ?? ???? ???? ??? ?? ?????? ????? ???? ?? ??????? ?????. ??? ???? ???? ?? ????? ????? ?? ?????? ?? ?? ????? ?? ??????? ?? ??????? ???? ??????? ? ???? ??????. ??? ???? ?????? ????? ? ???? ?????? ??? ???? ???????. ???? ?? ???? ??????? ??????? ????? ?? ????? ?? ??????? ?????? ??????? ?????? ?? ???????? ??? ?????? ??????? ????????. ????? ?? ???? ?????! ?shuhidat fi rieayat eajilat alyawm bisabab aldawari. yurjaa ziadat tanawul alma' watanawul wajabat mutakarirat litajanub aljafaf wainkhifad nisbat alsukar fi aldam. yurjaa alaitisal bitabib alrieayat al'awaliat ghdan litahdid maweid limutabaeat ziaratina  ? ?You were seen in urgent care today for dizziness.  Please increase your water intake and eat frequent meals to avoid dehydration and low blood sugar.  Please call your primary care doctor tomorrow to make an appointment to follow-up on our visit today.  We drew labs today to assess for anemia and electrolyte imbalances.  We also checked her thyroid today.  You will receive results of your lab work in the next 2 to 3 days via MyChart and via a phone call if your results are abnormal.  ? ?You may take tylenol for your headache at home. Your next dose is tomorrow morning since I gave you some in the clinic today. ? ?If you develop any new or worsening symptoms or do not improve in the next 2 to 3 days, please return.  If your symptoms are severe, please go to the emergency room.  Follow-up with your primary care provider for further evaluation and  management of your symptoms as well as ongoing wellness visits.  I hope you feel better! ?

## 2021-05-22 NOTE — ED Triage Notes (Signed)
Wednesday had Colonoscopy. States they removed some cysts in her colon. States she went back to work 3 days after procedure. States she felt very weak at work when she went back on Friday, almost passed out, and went home. Since then, she's felt weak, lightheaded, and like she has low blood pressure. States she has not contacted the office who did the procedure to update them about this. Reports nausea with the weak episode. Denies abdominal pain, vomiting, diarrhea, blood in stool, changes to stool.  ?

## 2021-05-22 NOTE — ED Provider Notes (Signed)
?[facility] ? ? ? ?CSN: [id number] ?Arrival date & time: 05/22/21  1847 ? ? ?  ? ?History   ?Chief Complaint ?Chief Complaint  ?Patient presents with  ? Near Syncope  ? ? ?HPI ?Kaitlyn Wise is a 51 y.o. female.  ? ?Patient presents to urgent care with weakness and near syncope since she had a colonoscopy 5 days ago where they "removed some cysts from her colon". States she went back to work 3 days after procedure and she felt very weak at work, almost passed out, and had to go home early. Since then, she's felt weak, lightheaded, and like she has low blood pressure. States she has not contacted the office who did the procedure. Reports nausea with the episode of weakness she had at work. Denies fever, abdominal pain, dizziness, vomiting, diarrhea, blood in stool, changes to stool frequency. States she has been drinking juice and water at home. She has been eating inconsistent meals due to nausea and weakness. Reports she "sleeps a lot during the day, eats when she wakes up, then goes back to sleep." She currently has a headache that is moderate and mostly localized to her bilateral forehead. Denies blurry vision or changes in visual acuity. Denies any other aggravating or relieving factors. ? ?Arabic interpreter used for entirety of patient encounter.  ? ? ?Near Syncope ? ? ?Past Medical History:  ?Diagnosis Date  ? Allergy   ? Anemia   ? Anxiety   ? Arthritis   ? Depression   ? Dyslipidemia   ? GERD (gastroesophageal reflux disease)   ? Thyroid disease   ? Uterine fibroid   ? Vertigo   ? ? ?Patient Active Problem List  ? Diagnosis Date Noted  ? Abnormal uterine bleeding (AUB) 11/25/2019  ? Chest pain in adult 08/01/2017  ? Costochondritis 08/01/2017  ? Current severe episode of major depressive disorder without psychotic features without prior episode ([place]) 08/01/2017  ? Mild episode of recurrent major depressive disorder ([place]) 01/18/2017  ? Migraine 12/05/2015  ? Gastritis and  gastroduodenitis 03/09/2015  ? Dyslipidemia 06/16/2013  ? Hypothyroidism 05/21/2013  ? ? ?Past Surgical History:  ?Procedure Laterality Date  ? CHOLECYSTECTOMY    ? ? ?OB History   ? ? Gravida  ?1  ? Para  ?1  ? Term  ?   ? Preterm  ?   ? AB  ?   ? Living  ?1  ?  ? ? SAB  ?   ? IAB  ?   ? Ectopic  ?   ? Multiple  ?   ? Live Births  ?1  ?   ?  ?  ? ? ? ?Home Medications   ? ?Prior to Admission medications   ?Medication Sig Start Date End Date Taking? Authorizing Provider  ?acetaminophen (TYLENOL) 500 MG tablet Take 2 tablets (1,000 mg total) by mouth every 6 (six) hours as needed. 05/22/21   Talbot Grumbling, FNP  ?carbamide peroxide (DEBROX) 6.5 % OTIC solution Place 5 drops into the right ear 2 (two) times daily. ?Patient not taking: Reported on 05/17/2021 02/13/21   Charlott Rakes, MD  ?cetirizine (ZYRTEC) 10 MG tablet Take 1 tablet (10 mg total) by mouth daily. ?Patient not taking: Reported on 05/17/2021 02/13/21   Charlott Rakes, MD  ?cholestyramine (QUESTRAN) 4 g packet Take 1 packet (4 g total) by mouth 2 (two) times daily. ?Patient not taking: Reported on 04/03/2021 01/17/21   Daryel November, MD  ?  dicyclomine (BENTYL) 10 MG capsule TAKE 1 CAPSULE (10 MG TOTAL) BY MOUTH 2 (TWO) TIMES DAILY AS NEEDED FOR SPASMS. ?Patient not taking: Reported on 05/17/2021 04/03/21 04/03/22  Daryel November, MD  ?levothyroxine (SYNTHROID) 137 MCG tablet Take 1 tablet (137 mcg total) by mouth daily. 02/14/21   Charlott Rakes, MD  ?norethindrone (AYGESTIN) 5 MG tablet Take 5 mg by mouth in the morning and at bedtime. ?Patient not taking: Reported on 05/17/2021 02/23/21   [provider]  ?omeprazole (PRILOSEC) 20 MG capsule Take 1 capsule (20 mg total) by mouth daily. ?Patient taking differently: Take 20 mg by mouth daily as needed. 01/17/21   Daryel November, MD  ?FLUoxetine (PROZAC) 20 MG capsule Take 1 capsule (20 mg total) by mouth daily. ?Patient not taking: No sig reported 12/30/19 02/15/20  Charlott Rakes, MD   ?medroxyPROGESTERone (PROVERA) 10 MG tablet Take 1 tablet (10 mg total) by mouth daily. 09/04/19 09/08/19  Charlott Rakes, MD  ?prazosin (MINIPRESS) 1 MG capsule Take 1 capsule (1 mg total) by mouth at bedtime. 10/22/17 06/03/19  Charlott Rakes, MD  ?topiramate (TOPAMAX) 50 MG tablet Take 1 tablet (50 mg total) by mouth 2 (two) times daily. ?Patient not taking: Reported on 04/14/2016 11/29/15 06/03/19  Charlott Rakes, MD  ? ? ?Family History ?Family History  ?Problem Relation Age of Onset  ? Diabetes Mother   ? Hypertension Mother   ? Diabetes Father   ? Hypertension Father   ? Heart disease Brother   ? Colon cancer Neg Hx   ? Esophageal cancer Neg Hx   ? Stomach cancer Neg Hx   ? ? ?Social History ?Social History  ? ?Tobacco Use  ? Smoking status: Never  ? Smokeless tobacco: Never  ?Vaping Use  ? Vaping Use: Never used  ?Substance Use Topics  ? Alcohol use: No  ? Drug use: No  ? ? ? ?Allergies   ?Eggs or egg-derived products, Fish allergy, and Milk-related compounds ? ? ?Review of Systems ?Review of Systems  ?Cardiovascular:  Positive for near-syncope.  ?Per HPI ? ?Physical Exam ?Triage Vital Signs ?ED Triage Vitals  ?Enc Vitals Group  ?   BP 05/22/21 1952 117/77  ?   Pulse Rate 05/22/21 1952 78  ?   Resp 05/22/21 1952 16  ?   Temp 05/22/21 1952 98.4 ?F (36.9 ?C)  ?   Temp Source 05/22/21 1952 Oral  ?   SpO2 05/22/21 1952 98 %  ?   Weight --   ?   Height --   ?   Head Circumference --   ?   Peak Flow --   ?   Pain Score 05/22/21 1954 0  ?   Pain Loc --   ?   Pain Edu? --   ?   Excl. in [place]? --   ? ?No data found. ? ?Updated Vital Signs ?BP 117/77 (BP Location: Left Arm)   Pulse 78   Temp 98.4 ?F (36.9 ?C) (Oral)   Resp 16   SpO2 98%  ? ?Visual Acuity ?Right Eye Distance:   ?Left Eye Distance:   ?Bilateral Distance:   ? ?Right Eye Near:   ?Left Eye Near:    ?Bilateral Near:    ? ?Physical Exam ?Vitals and nursing note reviewed.  ?Constitutional:   ?   General: She is not in acute distress. ?   Appearance: Normal  appearance. She is well-developed. She is not ill-appearing.  ?HENT:  ?   Head: Normocephalic  and atraumatic.  ?   Right Ear: Tympanic membrane, ear canal and external ear normal.  ?   Left Ear: Tympanic membrane, ear canal and external ear normal.  ?   Nose: Nose normal.  ?   Mouth/Throat:  ?   Mouth: Mucous membranes are moist.  ?   Pharynx: No posterior oropharyngeal erythema.  ?Eyes:  ?   Extraocular Movements: Extraocular movements intact.  ?   Conjunctiva/sclera: Conjunctivae normal.  ?Cardiovascular:  ?   Rate and Rhythm: Normal rate and regular rhythm.  ?   Pulses: Normal pulses.  ?   Heart sounds: Normal heart sounds. No murmur heard. ?  No friction rub. No gallop.  ?Pulmonary:  ?   Effort: Pulmonary effort is normal. No respiratory distress.  ?   Breath sounds: Normal breath sounds. No wheezing, rhonchi or rales.  ?Chest:  ?   Chest wall: No tenderness.  ?Abdominal:  ?   General: Bowel sounds are normal.  ?   Palpations: Abdomen is soft.  ?   Tenderness: There is no abdominal tenderness. There is no right CVA tenderness or left CVA tenderness.  ?Musculoskeletal:     ?   General: No swelling.  ?   Cervical back: Neck supple.  ?Skin: ?   General: Skin is warm and dry.  ?   Capillary Refill: Capillary refill takes less than 2 seconds.  ?   Findings: No rash.  ?Neurological:  ?   General: No focal deficit present.  ?   Mental Status: She is alert and oriented to person, place, and time. Mental status is at baseline.  ?   Cranial Nerves: Cranial nerves 2-12 are intact. No dysarthria.  ?   Sensory: Sensation is intact.  ?   Motor: Motor function is intact. No weakness.  ?   Coordination: Coordination is intact. Coordination normal.  ?   Gait: Gait is intact. Gait normal.  ?Psychiatric:     ?   Mood and Affect: Mood normal.     ?   Behavior: Behavior normal.     ?   Thought Content: Thought content normal.     ?   Judgment: Judgment normal.  ? ? ? ?UC Treatments / Results  ?Labs ?(all labs ordered are listed, but  only abnormal results are displayed) ? ? ?EKG ? ? ?Radiology ?No results found. ? ?Procedures ?Procedures (including critical care time) ? ?Medications Ordered in UC ?Medications  ?acetaminophen (TYLENOL) tablet 975 mg (975

## 2021-05-22 NOTE — Progress Notes (Signed)
Kaitlyn Wise, ?The polyp which I removed during your recent procedure was proven to be completely benign but is considered a "pre-cancerous" polyp that MAY have grown into cancer if it had not been removed.  Studies shows that at least 20% of women over age 49 and 30% of men over age 44 have pre-cancerous polyps.  Based on current nationally recognized surveillance guidelines, I recommend that you have a repeat colonoscopy in 7 years.  ? ?If you develop any new rectal bleeding, abdominal pain or significant bowel habit changes, please contact me before then. ? ?

## 2021-05-23 ENCOUNTER — Other Ambulatory Visit: Payer: Self-pay

## 2021-05-29 ENCOUNTER — Other Ambulatory Visit: Payer: Self-pay

## 2021-06-06 ENCOUNTER — Other Ambulatory Visit: Payer: Self-pay

## 2021-06-07 ENCOUNTER — Other Ambulatory Visit: Payer: Self-pay

## 2021-06-13 ENCOUNTER — Other Ambulatory Visit: Payer: Self-pay

## 2021-06-14 ENCOUNTER — Other Ambulatory Visit: Payer: Self-pay

## 2021-06-16 ENCOUNTER — Other Ambulatory Visit: Payer: Self-pay

## 2021-06-19 ENCOUNTER — Other Ambulatory Visit: Payer: Self-pay

## 2021-06-19 ENCOUNTER — Ambulatory Visit: Payer: 59 | Attending: Family Medicine | Admitting: Family Medicine

## 2021-06-19 ENCOUNTER — Encounter: Payer: Self-pay | Admitting: Family Medicine

## 2021-06-19 VITALS — BP 101/68 | HR 75 | Ht 62.0 in | Wt 162.4 lb

## 2021-06-19 DIAGNOSIS — Z131 Encounter for screening for diabetes mellitus: Secondary | ICD-10-CM | POA: Diagnosis not present

## 2021-06-19 DIAGNOSIS — R7303 Prediabetes: Secondary | ICD-10-CM | POA: Diagnosis not present

## 2021-06-19 DIAGNOSIS — Z8616 Personal history of COVID-19: Secondary | ICD-10-CM | POA: Diagnosis not present

## 2021-06-19 DIAGNOSIS — R42 Dizziness and giddiness: Secondary | ICD-10-CM | POA: Insufficient documentation

## 2021-06-19 DIAGNOSIS — E038 Other specified hypothyroidism: Secondary | ICD-10-CM | POA: Diagnosis not present

## 2021-06-19 DIAGNOSIS — Z79899 Other long term (current) drug therapy: Secondary | ICD-10-CM | POA: Diagnosis not present

## 2021-06-19 DIAGNOSIS — R000 Tachycardia, unspecified: Secondary | ICD-10-CM | POA: Insufficient documentation

## 2021-06-19 DIAGNOSIS — I959 Hypotension, unspecified: Secondary | ICD-10-CM | POA: Insufficient documentation

## 2021-06-19 LAB — POCT GLYCOSYLATED HEMOGLOBIN (HGB A1C): HbA1c, POC (prediabetic range): 5.4 % — AB (ref 5.7–6.4)

## 2021-06-19 MED ORDER — MECLIZINE HCL 25 MG PO TABS
25.0000 mg | ORAL_TABLET | Freq: Two times a day (BID) | ORAL | 1 refills | Status: DC | PRN
  Filled 2021-06-19: qty 30, 15d supply, fill #0

## 2021-06-19 MED ORDER — LEVOTHYROXINE SODIUM 125 MCG PO TABS
137.0000 ug | ORAL_TABLET | Freq: Every day | ORAL | 3 refills | Status: DC
  Filled 2021-06-19: qty 30, 30d supply, fill #0
  Filled 2021-07-19: qty 30, 30d supply, fill #1

## 2021-06-19 NOTE — Progress Notes (Signed)
Having dizziness

## 2021-06-19 NOTE — Patient Instructions (Signed)
??????   Vertigo ?????? ????? ???? ?????? ???? ????? ?? ??????? ??????? ?? ????? ????? ?? ??? ?? ????. ????? ?? ????? ????? ??? ?????? ????? ?? ?? ???. ???? ?????? ?? ???? ?????? ?? ????? ????. ??????? ???? ?? ???? ????? ??? ??? ?? ????? ????? ???? ?? ???? ?????? ?? ????? ???????? ??? ??????? ?? ????? ?????????. ?????? ????? ??????? ?????? ?????? ?????? ????? ??? ?????? ???? ?????. ???? ????? ?????????? ??????? ??????? ?????? ??????? ?? ??? ????? ???? ????? ????? ?????. ???? ??? ????????? ?? ??????: ????? ??????    ?????? ???? ?? ???? ?? ??????. ???? ????? ????? ?? ??????? ???? ??? ???? ???? ??????.  ?? ?????? ????????? ????????. ??????  ???? ?????? ?????? ??????? ??? ??????? ??????? ??????? ??????. ????? ???? ??????? ?????? ?? ??????? ?????? ??.  ?? ?????? ??????? ???? ????? ??? ???? ??????. ?????? ???? ??? ??? ?? ???? ?? ??? ?????? ?? ???? ?? ???????? ??? ??? ?? ??? ????? ?? ?????? ???.  ???? ????. ???? ????? ????? ?? ????? ???????? ?? ??? ???????? ???????? ??? ????? ???? ??????? ??????.  ??? ??? ????? ????? ?? ????? ?? ????? ?? ?????? ??? ??????? ???? ????????? ???? ?????? ??? ?????. ??? ??? ???? ??????? ?? ??? ???????? ????? ??? ?????.  ??? ???? ??????? ????? ?????? ????? ?? ???? ????? ???? ?? ??? ???????.  ???? ???????? ??? ?????? ??? ??? ???? ?????. ?? ?????? ?? ?????? ?? ?????? ???? ??? ?????? ?????? ????? ???? ?????? ??? ????????.  ?? ???? ??????? ?? ???????? ??? ??? ???? ?????. ??????? ????  ????? ??????? ??? ??????? ???? ??????? ??????? ????? ????? ????? ???? ?? ??? ???? ????.  ????? ????? ?????? ????????. ???? ??? ???. ???? ????? ??????? ?????? ?? ????:  ?? ????? ??????? ??? ?????? ?? ??????? ?? ??? ??? ????? ????.  ????? ????? ?? ???? ????? ?????.  ??????? ????.  ??????? ?????? ?? ????? ?? ????? ???????.  ?????? ?????? ???????? ???????? ?????? ????.  ?????? ???????? ?? ????? ?????? ?? ??? ????? ????. ???? ???????? ????? ?? ??????? ???????:  ?????? ?????? ????? ?? ???????  ??????.  ??????? ????? ???.  ???? ????? ?? ?????.  ??????? ??????? ?? ?????.  ????? ?????? ?? ?????? ?? ??????.  ????? ???? ?? ???? ?? ?????? ?? ?????.  ?????? ????????? ?? ????? ??? ????? ?? ??????. ???? ???? ??? ??????? ??? ????? ????? ????? ???? ???? ?????. ??? ?? ????? ?? ??? ???? ??????? ????? ?? ??. ????? ??? ???????? ?????? ??? ?????. ???? ?????? ??????? ??????? (911 ?? ???????? ??????? ?????????). ?? ??? ??????? ????? ??? ????????. ????  ?????? ????? ???? ?????? ???? ????? ?? ??????? ??????? ?? ????? ????? ?? ??? ?? ????.  ?????? ????? ??????? ?????? ?????? ?????? ????? ??? ?????? ???? ?????.  ???? ??????? ??????? ????????. ?? ????? ??? ???? ???? ???? ?????? ?? ?????? ??????? ????? ?? ????? ??? ???????.  ???? ???????? ??????? ?????? ??? ???? ?????? ?? ???? ?? ??????? ?? ??? ??? ????? ?? ??? ?? ????? ?? ???? ?? ?????? ?? ??????.  ???? ??? ???????? ??? ????? ??? ??? ????? ?? ???? ??? ?? ????? ?? ??????? ?? ??? ????? ?? ?????? ?? ????? ?? ??????. ??? ????? ?? ??? ????????? ?? ???? ?????? ????????? ???? ?????? ???? ??????? ??????. ???? ?? ?????? ??? ????? ???? ?? ???? ?? ???? ??????? ??????.? Document Revised: 01/11/2020 Document Reviewed: 01/11/2020 Elsevier Patient Education  [place].

## 2021-06-19 NOTE — Progress Notes (Signed)
Subjective:  Patient ID: Kaitlyn Wise, female    DOB: 1971-01-07  Age: 51 y.o. MRN: [id number]  CC: Hospitalization Follow-up   HPI Kaitlyn Wise is a 51 y.o. year old female with a history of hypothyroidism, Depression, history of COVID in 05/2020 who presents today for a follow up visit.   Interval History: She complains of dizziness which started after her Colonoscopy 1 month ago.This is described as present all the time. When she stands from a sitting position she feels her balance is off. On sitting from a lying position she feel dizzy. When trying to take a shower she has spontaneous dizziness with resulting tachycardia. She endorses sometimes skipping meals due to a poor appetite. BP is 101/68 but of note she does have low blood pressures Her LMP was on 04/02/21 and  her cycle has been irregular.  She did have menorrhagia in the past and her last hemoglobin was 11.4 last month.  Past Medical History:  Diagnosis Date   Allergy    Anemia    Anxiety    Arthritis    Depression    Dyslipidemia    GERD (gastroesophageal reflux disease)    Thyroid disease    Uterine fibroid    Vertigo     Past Surgical History:  Procedure Laterality Date   CHOLECYSTECTOMY      Family History  Problem Relation Age of Onset   Diabetes Mother    Hypertension Mother    Diabetes Father    Hypertension Father    Heart disease Brother    Colon cancer Neg Hx    Esophageal cancer Neg Hx    Stomach cancer Neg Hx     Social History   Socioeconomic History   Marital status: Widowed    Spouse name: Not on file   Number of children: Not on file   Years of education: Not on file   Highest education level: Not on file  Occupational History   Not on file  Tobacco Use   Smoking status: Never   Smokeless tobacco: Never  Vaping Use   Vaping Use: Never used  Substance and Sexual Activity   Alcohol use: No   Drug use: No   Sexual activity: Not Currently  Other  Topics Concern   Not on file  Social History Narrative   Not on file   Social Determinants of Health   Financial Resource Strain: Not on file  Food Insecurity: Not on file  Transportation Needs: Not on file  Physical Activity: Not on file  Stress: Not on file  Social Connections: Not on file    Allergies  Allergen Reactions   Eggs Or Egg-Derived Products    Fish Allergy Itching   Milk-Related Compounds Diarrhea and Itching    Outpatient Medications Prior to Visit  Medication Sig Dispense Refill   acetaminophen (TYLENOL) 500 MG tablet Take 2 tablets (1,000 mg total) by mouth every 6 (six) hours as needed. 30 tablet 0   omeprazole (PRILOSEC) 20 MG capsule Take 1 capsule (20 mg total) by mouth daily. (Patient taking differently: Take 20 mg by mouth daily as needed.) 60 capsule 1   levothyroxine (SYNTHROID) 137 MCG tablet Take 1 tablet (137 mcg total) by mouth daily. 90 tablet 1   carbamide peroxide (DEBROX) 6.5 % OTIC solution Place 5 drops into the right ear 2 (two) times daily. (Patient not taking: Reported on 05/17/2021) 15 mL 0   cetirizine (ZYRTEC) 10 MG tablet Take 1  tablet (10 mg total) by mouth daily. (Patient not taking: Reported on 05/17/2021) 30 tablet 1   cholestyramine (QUESTRAN) 4 g packet Take 1 packet (4 g total) by mouth 2 (two) times daily. (Patient not taking: Reported on 04/03/2021) 60 each 1   dicyclomine (BENTYL) 10 MG capsule TAKE 1 CAPSULE (10 MG TOTAL) BY MOUTH 2 (TWO) TIMES DAILY AS NEEDED FOR SPASMS. (Patient not taking: Reported on 05/17/2021) 60 capsule 2   norethindrone (AYGESTIN) 5 MG tablet Take 5 mg by mouth in the morning and at bedtime. (Patient not taking: Reported on 05/17/2021)     No facility-administered medications prior to visit.     ROS Review of Systems  Constitutional:  Negative for activity change, appetite change and fatigue.  HENT:  Negative for congestion, sinus pressure and sore throat.   Eyes:  Negative for visual disturbance.   Respiratory:  Negative for cough, chest tightness, shortness of breath and wheezing.   Cardiovascular:  Negative for chest pain and palpitations.  Gastrointestinal:  Negative for abdominal distention, abdominal pain and constipation.  Endocrine: Negative for polydipsia.  Genitourinary:  Negative for dysuria and frequency.  Musculoskeletal:  Negative for arthralgias and back pain.  Skin:  Negative for rash.  Neurological:  Positive for dizziness. Negative for tremors, light-headedness and numbness.  Hematological:  Does not bruise/bleed easily.  Psychiatric/Behavioral:  Negative for agitation and behavioral problems.    Objective:  BP 101/68   Pulse 75   Ht '5\' 2"'$  (1.575 m)   Wt 162 lb 6.4 oz (73.7 kg)   SpO2 100%   BMI 29.70 kg/m      06/19/2021   11:24 AM 05/22/2021    7:52 PM 05/17/2021   11:19 AM  BP/Weight  Systolic BP 784 696 295  Diastolic BP 68 77 62  Wt. (Lbs) 162.4    BMI 29.7 kg/m2        Physical Exam Constitutional:      Appearance: She is well-developed.  HENT:     Head:     Comments: Positive Dix-Hallpike maneuver    Right Ear: Tympanic membrane normal.     Left Ear: Tympanic membrane normal.  Cardiovascular:     Rate and Rhythm: Normal rate.     Heart sounds: Normal heart sounds. No murmur heard. Pulmonary:     Effort: Pulmonary effort is normal.     Breath sounds: Normal breath sounds. No wheezing or rales.  Chest:     Chest wall: No tenderness.  Abdominal:     General: Bowel sounds are normal. There is no distension.     Palpations: Abdomen is soft. There is no mass.     Tenderness: There is no abdominal tenderness.  Musculoskeletal:        General: Normal range of motion.     Right lower leg: No edema.     Left lower leg: No edema.  Neurological:     Mental Status: She is alert and oriented to person, place, and time.  Psychiatric:        Mood and Affect: Mood normal.       Latest Ref Rng & Units 05/22/2021    8:53 PM 01/17/2021    3:10 PM  09/12/2020    9:45 AM  CMP  Glucose 70 - 99 mg/dL 102   86   93    BUN 6 - 20 mg/dL '17   15   13    '$ Creatinine 0.44 - 1.00 mg/dL 0.74   0.63  0.82    Sodium 135 - 145 mmol/L 138   138   139    Potassium 3.5 - 5.1 mmol/L 4.3   4.2   5.0    Chloride 98 - 111 mmol/L 107   105   103    CO2 22 - 32 mmol/L '25   27   21    '$ Calcium 8.9 - 10.3 mg/dL 9.1   9.5   9.9    Total Protein 6.0 - 8.3 g/dL  7.2     Total Bilirubin 0.2 - 1.2 mg/dL  0.2     Alkaline Phos 39 - 117 U/L  126     AST 0 - 37 U/L  14     ALT 0 - 35 U/L  14       Lipid Panel     Component Value Date/Time   CHOL 186 09/12/2020 0945   TRIG 100 09/12/2020 0945   HDL 49 09/12/2020 0945   CHOLHDL 3.8 09/12/2020 0945   CHOLHDL 2.9 08/25/2015 1111   VLDL 17 08/25/2015 1111   LDLCALC 119 (H) 09/12/2020 0945    CBC    Component Value Date/Time   WBC 5.9 05/22/2021 2053   RBC 4.88 05/22/2021 2053   HGB 11.4 (L) 05/22/2021 2053   HGB 11.0 (L) 09/12/2020 0945   HCT 37.7 05/22/2021 2053   HCT 37.4 09/12/2020 0945   PLT 300 05/22/2021 2053   PLT 360 09/12/2020 0945   MCV 77.3 (L) 05/22/2021 2053   MCV 75 (L) 09/12/2020 0945   MCH 23.4 (L) 05/22/2021 2053   MCHC 30.2 05/22/2021 2053   RDW 18.3 (H) 05/22/2021 2053   RDW 16.2 (H) 09/12/2020 0945   LYMPHSABS 2.0 01/17/2021 1510   LYMPHSABS 1.7 09/12/2020 0945   MONOABS 0.5 01/17/2021 1510   EOSABS 0.5 01/17/2021 1510   EOSABS 0.4 09/12/2020 0945   BASOSABS 0.1 01/17/2021 1510   BASOSABS 0.1 09/12/2020 0945    Lab Results  Component Value Date   HGBA1C 5.9 (H) 02/13/2021   Lab Results  Component Value Date   TSH 0.010 (L) 05/22/2021     Assessment & Plan:  1. Other specified hypothyroidism Suppressed TSH from urgent care visit last month.  This could explain her palpitations Her levothyroxine dose was not adjusted at her ED visit I have decreased levothyroxine from 137 mcg to 125 mcg Follow-up thyroid panel in 6 weeks - levothyroxine (SYNTHROID) 125 MCG  tablet; Take 1 tablet (125 mcg total) by mouth daily.  Dispense: 30 tablet; Refill: 3 - T4, free; Future - TSH; Future  2. Screening for diabetes mellitus A1c 6.4  3. Prediabetes See #2 above - POCT glycosylated hemoglobin (Hb A1C)  4. Vertigo Could explain dizziness EKG revealed normal sinus rhythm, no ST changes Trial of meclizine and if this is ineffective will refer for vestibular rehab - meclizine (ANTIVERT) 25 MG tablet; Take 1 tablet (25 mg total) by mouth 2 (two) times daily as needed for dizziness.  Dispense: 30 tablet; Refill: 1   Meds ordered this encounter  Medications   levothyroxine (SYNTHROID) 125 MCG tablet    Sig: Take 1 tablet (125 mcg total) by mouth daily.    Dispense:  30 tablet    Refill:  3    Dose decrease   meclizine (ANTIVERT) 25 MG tablet    Sig: Take 1 tablet (25 mg total) by mouth 2 (two) times daily as needed for dizziness.    Dispense:  30 tablet  Refill:  1    Follow-up: Return in about 3 months (around 09/19/2021) for Chronic medical conditions.       Charlott Rakes, MD, FAAFP. [facility] [place], [place]   06/19/2021, 12:13 PM

## 2021-07-19 ENCOUNTER — Other Ambulatory Visit: Payer: Self-pay

## 2021-07-28 ENCOUNTER — Other Ambulatory Visit: Payer: Self-pay

## 2021-08-01 ENCOUNTER — Ambulatory Visit: Payer: 59 | Attending: Family Medicine

## 2021-08-01 DIAGNOSIS — E038 Other specified hypothyroidism: Secondary | ICD-10-CM

## 2021-08-02 ENCOUNTER — Other Ambulatory Visit: Payer: Self-pay

## 2021-08-02 ENCOUNTER — Other Ambulatory Visit: Payer: Self-pay | Admitting: Family Medicine

## 2021-08-02 DIAGNOSIS — E038 Other specified hypothyroidism: Secondary | ICD-10-CM

## 2021-08-02 LAB — TSH: TSH: 0.037 u[IU]/mL — ABNORMAL LOW (ref 0.450–4.500)

## 2021-08-02 LAB — T4, FREE: Free T4: 1.28 ng/dL (ref 0.82–1.77)

## 2021-08-02 MED ORDER — LEVOTHYROXINE SODIUM 112 MCG PO TABS
112.0000 ug | ORAL_TABLET | Freq: Every day | ORAL | 3 refills | Status: DC
  Filled 2021-08-02 – 2021-11-27 (×2): qty 30, 30d supply, fill #0
  Filled 2021-12-28: qty 30, 30d supply, fill #1
  Filled 2022-01-28: qty 30, 30d supply, fill #2
  Filled 2022-03-01: qty 30, 30d supply, fill #3

## 2021-08-08 ENCOUNTER — Other Ambulatory Visit: Payer: Self-pay

## 2021-08-09 ENCOUNTER — Other Ambulatory Visit: Payer: Self-pay

## 2021-10-03 ENCOUNTER — Ambulatory Visit: Payer: 59 | Admitting: Family Medicine

## 2021-11-27 ENCOUNTER — Other Ambulatory Visit: Payer: Self-pay

## 2021-11-29 ENCOUNTER — Other Ambulatory Visit: Payer: Self-pay

## 2021-11-29 ENCOUNTER — Encounter (HOSPITAL_COMMUNITY): Payer: Self-pay

## 2021-11-29 ENCOUNTER — Emergency Department (HOSPITAL_COMMUNITY): Payer: Commercial Managed Care - HMO

## 2021-11-29 ENCOUNTER — Emergency Department (HOSPITAL_COMMUNITY)
Admission: EM | Admit: 2021-11-29 | Discharge: 2021-11-29 | Disposition: A | Discharge status: Home / Self Care | Payer: Commercial Managed Care - HMO | Attending: Emergency Medicine | Admitting: Emergency Medicine

## 2021-11-29 DIAGNOSIS — I10 Essential (primary) hypertension: Secondary | ICD-10-CM | POA: Insufficient documentation

## 2021-11-29 DIAGNOSIS — R112 Nausea with vomiting, unspecified: Secondary | ICD-10-CM | POA: Diagnosis not present

## 2021-11-29 DIAGNOSIS — Z79899 Other long term (current) drug therapy: Secondary | ICD-10-CM | POA: Diagnosis not present

## 2021-11-29 DIAGNOSIS — R109 Unspecified abdominal pain: Secondary | ICD-10-CM | POA: Diagnosis present

## 2021-11-29 DIAGNOSIS — E039 Hypothyroidism, unspecified: Secondary | ICD-10-CM | POA: Insufficient documentation

## 2021-11-29 DIAGNOSIS — N390 Urinary tract infection, site not specified: Secondary | ICD-10-CM | POA: Diagnosis not present

## 2021-11-29 LAB — CBC WITH DIFFERENTIAL/PLATELET
Abs Immature Granulocytes: 0.01 10*3/uL (ref 0.00–0.07)
Basophils Absolute: 0.1 10*3/uL (ref 0.0–0.1)
Basophils Relative: 1 %
Eosinophils Absolute: 0.4 10*3/uL (ref 0.0–0.5)
Eosinophils Relative: 5 %
HCT: 43.4 % (ref 36.0–46.0)
Hemoglobin: 13.5 g/dL (ref 12.0–15.0)
Immature Granulocytes: 0 %
Lymphocytes Relative: 29 %
Lymphs Abs: 2.5 10*3/uL (ref 0.7–4.0)
MCH: 26.8 pg (ref 26.0–34.0)
MCHC: 31.1 g/dL (ref 30.0–36.0)
MCV: 86.1 fL (ref 80.0–100.0)
Monocytes Absolute: 0.4 10*3/uL (ref 0.1–1.0)
Monocytes Relative: 5 %
Neutro Abs: 5.1 10*3/uL (ref 1.7–7.7)
Neutrophils Relative %: 60 %
Platelets: 275 10*3/uL (ref 150–400)
RBC: 5.04 MIL/uL (ref 3.87–5.11)
RDW: 16.2 % — ABNORMAL HIGH (ref 11.5–15.5)
WBC: 8.4 10*3/uL (ref 4.0–10.5)
nRBC: 0 % (ref 0.0–0.2)

## 2021-11-29 LAB — URINALYSIS, ROUTINE W REFLEX MICROSCOPIC
Bilirubin Urine: NEGATIVE
Glucose, UA: NEGATIVE mg/dL
Ketones, ur: NEGATIVE mg/dL
Nitrite: NEGATIVE
Protein, ur: 30 mg/dL — AB
Specific Gravity, Urine: 1.025 (ref 1.005–1.030)
WBC, UA: >50 WBC/hpf — ABNORMAL HIGH (ref 0–5)
pH: 5 (ref 5.0–8.0)

## 2021-11-29 LAB — COMPREHENSIVE METABOLIC PANEL
ALT: 19 U/L (ref 0–44)
AST: 26 U/L (ref 15–41)
Albumin: 3.9 g/dL (ref 3.5–5.0)
Alkaline Phosphatase: 122 U/L (ref 38–126)
Anion gap: 16 — ABNORMAL HIGH (ref 5–15)
BUN: 20 mg/dL (ref 6–20)
CO2: 20 mmol/L — ABNORMAL LOW (ref 22–32)
Calcium: 9.9 mg/dL (ref 8.9–10.3)
Chloride: 104 mmol/L (ref 98–111)
Creatinine, Ser: 1.1 mg/dL — ABNORMAL HIGH (ref 0.44–1.00)
GFR, Estimated: >60 mL/min (ref >60)
Glucose, Bld: 138 mg/dL — ABNORMAL HIGH (ref 70–99)
Potassium: 4.1 mmol/L (ref 3.5–5.1)
Sodium: 140 mmol/L (ref 135–145)
Total Bilirubin: 0.2 mg/dL — ABNORMAL LOW (ref 0.3–1.2)
Total Protein: 8.1 g/dL (ref 6.5–8.1)

## 2021-11-29 LAB — LIPASE, BLOOD: Lipase: 45 U/L (ref 11–51)

## 2021-11-29 LAB — PREGNANCY, URINE: Preg Test, Ur: NEGATIVE

## 2021-11-29 MED ORDER — OXYCODONE-ACETAMINOPHEN 5-325 MG PO TABS
1.0000 | ORAL_TABLET | Freq: Once | ORAL | Status: AC
  Administered 2021-11-29: 1 via ORAL
  Filled 2021-11-29: qty 1

## 2021-11-29 MED ORDER — ONDANSETRON HCL 4 MG PO TABS
4.0000 mg | ORAL_TABLET | Freq: Four times a day (QID) | ORAL | 0 refills | Status: DC
  Filled 2021-11-29: qty 12, 3d supply, fill #0

## 2021-11-29 MED ORDER — IBUPROFEN 600 MG PO TABS
600.0000 mg | ORAL_TABLET | Freq: Four times a day (QID) | ORAL | 0 refills | Status: DC | PRN
  Filled 2021-11-29: qty 30, 8d supply, fill #0

## 2021-11-29 MED ORDER — CEFDINIR 300 MG PO CAPS
300.0000 mg | ORAL_CAPSULE | Freq: Once | ORAL | Status: AC
  Administered 2021-11-29: 300 mg via ORAL
  Filled 2021-11-29: qty 1

## 2021-11-29 MED ORDER — CEFDINIR 300 MG PO CAPS
300.0000 mg | ORAL_CAPSULE | Freq: Two times a day (BID) | ORAL | 0 refills | Status: AC
  Filled 2021-11-29: qty 14, 7d supply, fill #0

## 2021-11-29 NOTE — ED Provider Notes (Signed)
[facility] EMERGENCY DEPARTMENT Provider Note  CSN: [id number] Arrival date & time: 11/29/21 1194  Chief Complaint(s) Abdominal Pain  HPI Kaitlyn Wise is a 51 y.o. female with history of hypertension, hyperlipidemia presenting to the emergency department with abdominal pain.  Patient reports abdominal pain yesterday.  She reports that she called EMS due to the pain.  She also reports some nausea and vomiting at home.  She denies any dysuria, flank pain.  She denies any fevers or chills.  She reports symptoms were previously strong and have since improved significantly.  Denies similar episodes in the past.  No diarrhea, blood in the stool, vaginal bleeding.  Arabic interpreter used  Past Medical History Past Medical History:  Diagnosis Date   Allergy    Anemia    Anxiety    Arthritis    Depression    Dyslipidemia    GERD (gastroesophageal reflux disease)    Thyroid disease    Uterine fibroid    Vertigo    Patient Active Problem List   Diagnosis Date Noted   Abnormal uterine bleeding (AUB) 11/25/2019   Chest pain in adult 08/01/2017   Costochondritis 08/01/2017   Current severe episode of major depressive disorder without psychotic features without prior episode ([place]) 08/01/2017   Mild episode of recurrent major depressive disorder ([place]) 01/18/2017   Migraine 12/05/2015   Gastritis and gastroduodenitis 03/09/2015   Dyslipidemia 06/16/2013   Hypothyroidism 05/21/2013   Home Medication(s) Prior to Admission medications   Medication Sig Start Date End Date Taking? Authorizing Provider  cefdinir (OMNICEF) 300 MG capsule Take 1 capsule (300 mg total) by mouth 2 (two) times daily for 7 days. 11/29/21 12/06/21 Yes Cristie Hem, MD  ibuprofen (ADVIL) 600 MG tablet Take 1 tablet (600 mg total) by mouth every 6 (six) hours as needed. 11/29/21  Yes Cristie Hem, MD  ondansetron (ZOFRAN) 4 MG tablet Take 1 tablet (4 mg total) by mouth  every 6 (six) hours. 11/29/21  Yes Cristie Hem, MD  acetaminophen (TYLENOL) 500 MG tablet Take 2 tablets (1,000 mg total) by mouth every 6 (six) hours as needed. 05/22/21   Talbot Grumbling, FNP  carbamide peroxide (DEBROX) 6.5 % OTIC solution Place 5 drops into the right ear 2 (two) times daily. Patient not taking: Reported on 05/17/2021 02/13/21   Charlott Rakes, MD  cetirizine (ZYRTEC) 10 MG tablet Take 1 tablet (10 mg total) by mouth daily. Patient not taking: Reported on 05/17/2021 02/13/21   Charlott Rakes, MD  cholestyramine (QUESTRAN) 4 g packet Take 1 packet (4 g total) by mouth 2 (two) times daily. Patient not taking: Reported on 04/03/2021 01/17/21   Daryel November, MD  dicyclomine (BENTYL) 10 MG capsule TAKE 1 CAPSULE (10 MG TOTAL) BY MOUTH 2 (TWO) TIMES DAILY AS NEEDED FOR SPASMS. Patient not taking: Reported on 05/17/2021 04/03/21 04/03/22  Daryel November, MD  levothyroxine (SYNTHROID) 112 MCG tablet Take 1 tablet (112 mcg total) by mouth daily. 08/02/21   Charlott Rakes, MD  meclizine (ANTIVERT) 25 MG tablet Take 1 tablet (25 mg total) by mouth 2 (two) times daily as needed for dizziness. 06/19/21   Charlott Rakes, MD  norethindrone (AYGESTIN) 5 MG tablet Take 5 mg by mouth in the morning and at bedtime. Patient not taking: Reported on 05/17/2021 02/23/21   [provider]  omeprazole (PRILOSEC) 20 MG capsule Take 1 capsule (20 mg total) by mouth daily. Patient taking differently: Take 20 mg by  mouth daily as needed. 01/17/21   Daryel November, MD  FLUoxetine (PROZAC) 20 MG capsule Take 1 capsule (20 mg total) by mouth daily. Patient not taking: No sig reported 12/30/19 02/15/20  Charlott Rakes, MD  medroxyPROGESTERone (PROVERA) 10 MG tablet Take 1 tablet (10 mg total) by mouth daily. 09/04/19 09/08/19  Charlott Rakes, MD  prazosin (MINIPRESS) 1 MG capsule Take 1 capsule (1 mg total) by mouth at bedtime. 10/22/17 06/03/19  Charlott Rakes, MD  topiramate (TOPAMAX) 50  MG tablet Take 1 tablet (50 mg total) by mouth 2 (two) times daily. Patient not taking: Reported on 04/14/2016 11/29/15 06/03/19  Charlott Rakes, MD                                                                                                                                    Past Surgical History Past Surgical History:  Procedure Laterality Date   CHOLECYSTECTOMY     Family History Family History  Problem Relation Age of Onset   Diabetes Mother    Hypertension Mother    Diabetes Father    Hypertension Father    Heart disease Brother    Colon cancer Neg Hx    Esophageal cancer Neg Hx    Stomach cancer Neg Hx     Social History Social History   Tobacco Use   Smoking status: Never   Smokeless tobacco: Never  Vaping Use   Vaping Use: Never used  Substance Use Topics   Alcohol use: No   Drug use: No   Allergies Eggs or egg-derived products, Fish allergy, and Milk-related compounds  Review of Systems Review of Systems  All other systems reviewed and are negative.   Physical Exam Vital Signs  I have reviewed the triage vital signs BP 138/71   Pulse 62   Temp 97.7 F (36.5 C) (Oral)   Resp 16   Ht '5\' 2"'$  (1.575 m)   Wt 72.6 kg   SpO2 100%   BMI 29.26 kg/m  Physical Exam Vitals and nursing note reviewed.  Constitutional:      General: She is not in acute distress.    Appearance: She is well-developed.  HENT:     Head: Normocephalic and atraumatic.     Mouth/Throat:     Mouth: Mucous membranes are moist.  Eyes:     Pupils: Pupils are equal, round, and reactive to light.  Cardiovascular:     Rate and Rhythm: Normal rate and regular rhythm.     Heart sounds: No murmur heard. Pulmonary:     Effort: Pulmonary effort is normal. No respiratory distress.     Breath sounds: Normal breath sounds.  Abdominal:     General: Abdomen is flat.     Palpations: Abdomen is soft.     Tenderness: There is abdominal tenderness in the suprapubic area. There is no right CVA  tenderness or left CVA tenderness.  Musculoskeletal:  General: No tenderness.     Right lower leg: No edema.     Left lower leg: No edema.  Skin:    General: Skin is warm and dry.  Neurological:     General: No focal deficit present.     Mental Status: She is alert. Mental status is at baseline.  Psychiatric:        Mood and Affect: Mood normal.        Behavior: Behavior normal.     ED Results and Treatments Labs (all labs ordered are listed, but only abnormal results are displayed) Labs Reviewed  CBC WITH DIFFERENTIAL/PLATELET - Abnormal; Notable for the following components:      Result Value   RDW 16.2 (*)    All other components within normal limits  COMPREHENSIVE METABOLIC PANEL - Abnormal; Notable for the following components:   CO2 20 (*)    Glucose, Bld 138 (*)    Creatinine, Ser 1.10 (*)    Total Bilirubin 0.2 (*)    Anion gap 16 (*)    All other components within normal limits  URINALYSIS, ROUTINE W REFLEX MICROSCOPIC - Abnormal; Notable for the following components:   APPearance CLOUDY (*)    Hgb urine dipstick MODERATE (*)    Protein, ur 30 (*)    Leukocytes,Ua LARGE (*)    WBC, UA >50 (*)    Bacteria, UA FEW (*)    Non Squamous Epithelial 0-5 (*)    All other components within normal limits  URINE CULTURE  LIPASE, BLOOD  PREGNANCY, URINE                                                                                                                          Radiology CT ABDOMEN PELVIS WO CONTRAST  Result Date: 11/29/2021 CLINICAL DATA:  Right lower quadrant abdominal pain EXAM: CT ABDOMEN AND PELVIS WITHOUT CONTRAST TECHNIQUE: Multidetector CT imaging of the abdomen and pelvis was performed following the standard protocol without IV contrast. RADIATION DOSE REDUCTION: This exam was performed according to the departmental dose-optimization program which includes automated exposure control, adjustment of the mA and/or kV according to patient size  and/or use of iterative reconstruction technique. COMPARISON:  04/14/2016 FINDINGS: Lower chest: Scattered small subpleural nodules in the lungs bilaterally, favoring benign subpleural lymph nodes, including a dominant 1 mm left lower lobe nodule (series 6/image 20), unchanged from 2018, benign. No follow-up is recommended per Fleischner Society guidelines. Hepatobiliary: Unenhanced liver is unremarkable. Status post cholecystectomy. No intrahepatic or extrahepatic ductal dilatation. Pancreas: Within normal limits. Spleen: Within normal limits. Adrenals/Urinary Tract: Adrenal glands are within normal limits. Kidneys are within normal limits.  No hydronephrosis. Bladder is within normal limits. Stomach/Bowel: Stomach is within normal limits. No evidence of bowel obstruction. Normal appendix (series 4/image 61). No colonic wall thickening or inflammatory changes. Vascular/Lymphatic: No evidence of abdominal aortic aneurysm. No suspicious abdominopelvic lymphadenopathy. Reproductive: Uterus is within normal limits. Bilateral ovaries are within normal limits. Other: No  abdominopelvic ascites. Musculoskeletal: Mild degenerative changes of the lumbar spine. IMPRESSION: Normal appendix. No CT findings to account for the patient's right lower quadrant abdominal pain. Status post cholecystectomy. Electronically Signed   By: Julian Hy M.D.   On: 11/29/2021 04:00    Pertinent labs & imaging results that were available during my care of the patient were reviewed by me and considered in my medical decision making (see MDM for details).  Medications Ordered in ED Medications  cefdinir (OMNICEF) capsule 300 mg (has no administration in time range)  oxyCODONE-acetaminophen (PERCOCET/ROXICET) 5-325 MG per tablet 1 tablet (1 tablet Oral Given 11/29/21 0049)                                                                                                                                      Procedures Procedures  (including critical care time)  Medical Decision Making / ED Course   MDM:  51 year old female presenting to the emergency department abdominal pain.  Patient well-appearing, physical exam notable for mild suprapubic tenderness.  No CVA tenderness on exam.  Suspect urinary infection given suprapubic tenderness, urinalysis with many WBCs, leuk esterase and bacteria.  Will add on urine culture.  Although patient had nausea, no CVA tenderness, fevers to suggest pyelonephritis.  We will prescribe cefdinir, ibuprofen, ondansetron.  Differential also included appendicitis, cholecystitis, pancreatitis, volvulus, small bowel obstruction, perforation, but CT scan overall reassuring without evidence of dangerous intra-abdominal pathology.  Patient is already had cholecystectomy.  Will discharge patient to home. All questions answered. Patient comfortable with plan of discharge. Return precautions discussed with patient and specified on the after visit summary.       Additional history obtained: -Additional history obtained from ems -External records from outside source obtained and reviewed including: Chart review including previous notes, labs, imaging, consultation notes including 05/22/21   Lab Tests: -I ordered, reviewed, and interpreted labs.   The pertinent results include:   Labs Reviewed  CBC WITH DIFFERENTIAL/PLATELET - Abnormal; Notable for the following components:      Result Value   RDW 16.2 (*)    All other components within normal limits  COMPREHENSIVE METABOLIC PANEL - Abnormal; Notable for the following components:   CO2 20 (*)    Glucose, Bld 138 (*)    Creatinine, Ser 1.10 (*)    Total Bilirubin 0.2 (*)    Anion gap 16 (*)    All other components within normal limits  URINALYSIS, ROUTINE W REFLEX MICROSCOPIC - Abnormal; Notable for the following components:   APPearance CLOUDY (*)    Hgb urine dipstick MODERATE (*)    Protein, ur 30 (*)     Leukocytes,Ua LARGE (*)    WBC, UA >50 (*)    Bacteria, UA FEW (*)    Non Squamous Epithelial 0-5 (*)    All other components within normal limits  URINE CULTURE  LIPASE, BLOOD  PREGNANCY, URINE  Notable for findings concerning for UTI   Imaging Studies ordered: I ordered imaging studies including CT A/P On my interpretation imaging demonstrates no acute process I independently visualized and interpreted imaging. I agree with the radiologist interpretation   Medicines ordered and prescription drug management: Meds ordered this encounter  Medications   oxyCODONE-acetaminophen (PERCOCET/ROXICET) 5-325 MG per tablet 1 tablet   ibuprofen (ADVIL) 600 MG tablet    Sig: Take 1 tablet (600 mg total) by mouth every 6 (six) hours as needed.    Dispense:  30 tablet    Refill:  0   ondansetron (ZOFRAN) 4 MG tablet    Sig: Take 1 tablet (4 mg total) by mouth every 6 (six) hours.    Dispense:  12 tablet    Refill:  0   cefdinir (OMNICEF) 300 MG capsule    Sig: Take 1 capsule (300 mg total) by mouth 2 (two) times daily for 7 days.    Dispense:  14 capsule    Refill:  0   cefdinir (OMNICEF) capsule 300 mg    -I have reviewed the patients home medicines and have made adjustments as needed    Reevaluation: After the interventions noted above, I reevaluated the patient and found that they have improved  Co morbidities that complicate the patient evaluation  Past Medical History:  Diagnosis Date   Allergy    Anemia    Anxiety    Arthritis    Depression    Dyslipidemia    GERD (gastroesophageal reflux disease)    Thyroid disease    Uterine fibroid    Vertigo       Dispostion: Disposition decision including need for hospitalization was considered, and patient discharged from emergency department.    Final Clinical Impression(s) / ED Diagnoses Final diagnoses:  Lower urinary tract infectious disease     This chart was dictated using voice recognition software.   Despite best efforts to proofread,  errors can occur which can change the documentation meaning.    Cristie Hem, MD 11/29/21 1650

## 2021-11-29 NOTE — ED Notes (Signed)
Interpreter [phone] used to go over d/c instructions.

## 2021-11-29 NOTE — ED Triage Notes (Signed)
Fish farm manager used for triage.   Describes RLQ abdominal pain of 3 hours. Unable to sit.

## 2021-11-29 NOTE — Discharge Instructions (Addendum)
??? ???? ?????? ????? ???? ???? ?????. ???? ??????? ??? ???? ????? ?? ??????? ???????. ?? ????? ??????? ??????? ?? ????? ????? ?? ????. ??? ????? ?? ????? ????? ????????. ???? ??? ??? ??? ??? ??? ?????. ??? ????? ?? ?????? ?????? ??? ?? ??????? ????? ?????? ????   7 ????. ???? ?????? ??? ?????? ?? ?? ??????? ????.  We have evaluated you for your abdominal pain.  Your work-up demonstrates that you have a urinary infection.  Your CT scan did not show any dangerous problems in your abdomen.  We have prescribed you medication for pain and nausea.  Please take these only if needed.  We have prescribed you an antibiotic which you should take twice a day for 7 days.  Please return if any of your symptoms worsen.

## 2021-11-29 NOTE — ED Notes (Signed)
CT reports PIV infiltrated, they placed another and it infiltrated. New order obtained for Non-con CT abd/ pelvis

## 2021-11-29 NOTE — ED Provider Triage Note (Signed)
Emergency Medicine Provider Triage Evaluation Note  [place] , a 51 y.o. female  was evaluated in triage.  Pt complains of right lower abdominal pain, onset 3 hours ago.  10 out of 10, patient is moaning and screaming in triage.  History is limited due to patient's presentation.  Has had several episodes of dry heaving.  Denies any fevers.  History of fibroids per chart review.  Review of Systems  Positive: As above  Negative: As above   Physical Exam  There were no vitals taken for this visit. Gen:   Awake, no distress   Resp:  Normal effort  MSK:   Moves extremities without difficulty  Other:  Moaning, pacing around the room, tender to the right lower quadrant  Medical Decision Making  Medically screening exam initiated at 12:29 AM.  Appropriate orders placed.  Lane was informed that the remainder of the evaluation will be completed by another provider, this initial triage assessment does not replace that evaluation, and the importance of remaining in the ED until their evaluation is complete.     Garald Balding, PA-C 11/29/21 [phone]

## 2021-11-30 ENCOUNTER — Other Ambulatory Visit: Payer: Self-pay

## 2021-12-01 LAB — URINE CULTURE

## 2021-12-28 ENCOUNTER — Other Ambulatory Visit: Payer: Self-pay

## 2022-01-29 ENCOUNTER — Other Ambulatory Visit: Payer: Self-pay

## 2022-02-14 IMAGING — MG DIGITAL DIAGNOSTIC BILAT W/ TOMO W/ CAD
8 series · 8 of 24 positions shown · non-contrast
Comparison: Previous exam(s).

CLINICAL DATA: 49-year-old female with diffuse bilateral breast
pain.

EXAM:
DIGITAL DIAGNOSTIC BILATERAL MAMMOGRAM WITH CAD AND TOMO

[R MLO synth-2D]
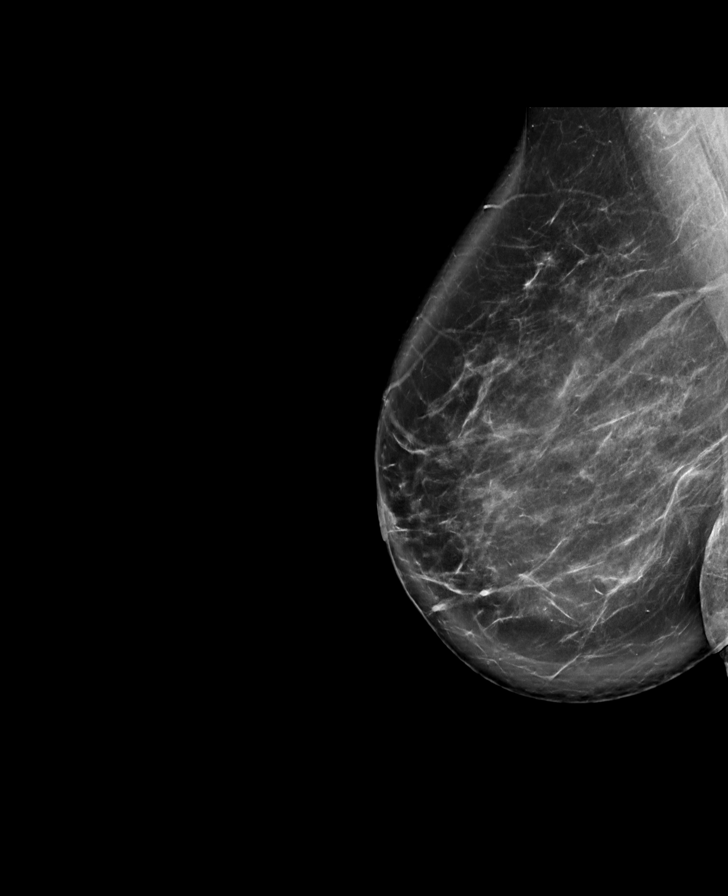

[R CC synth-2D]
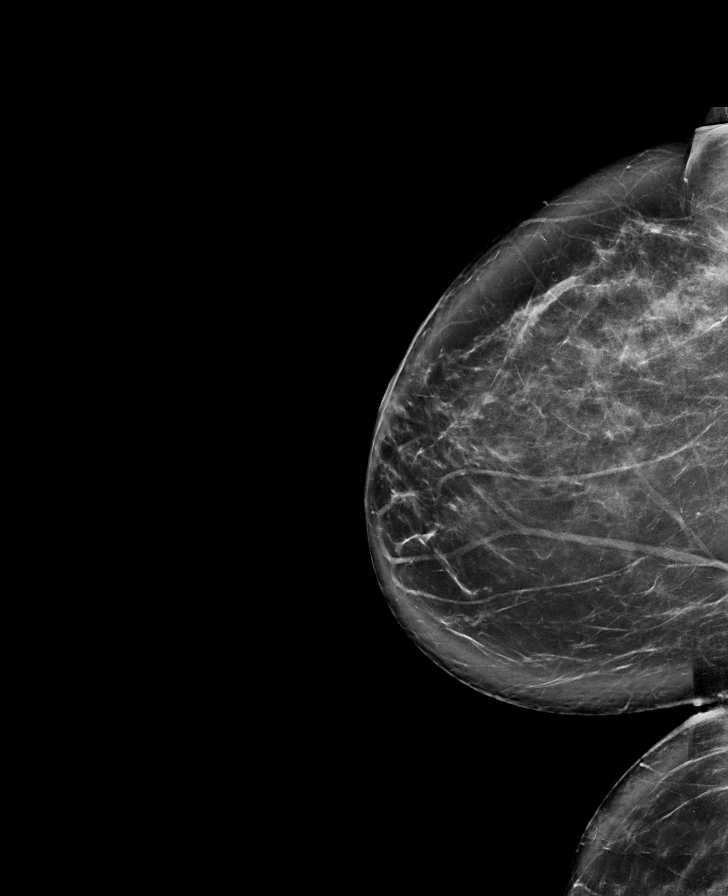

[L MLO synth-2D]
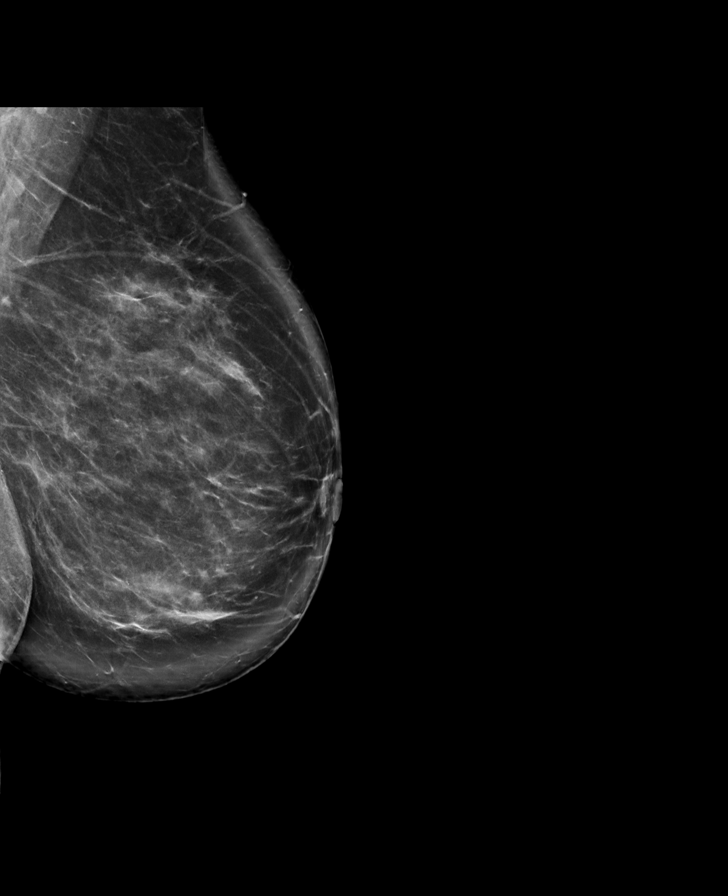

[L CC synth-2D]
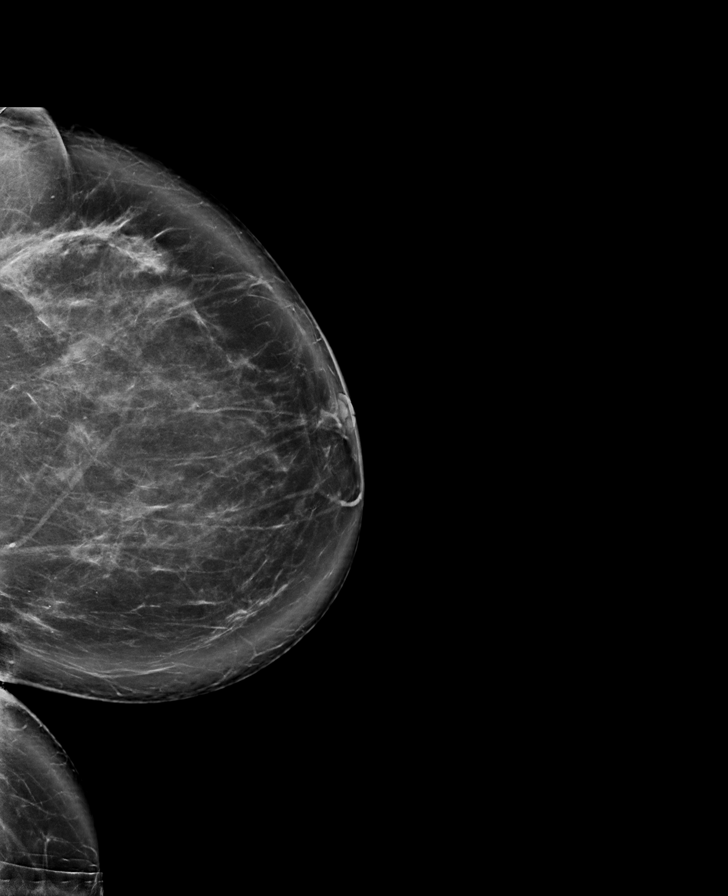

[L MLO tomo · tomo slice 54/107.0]
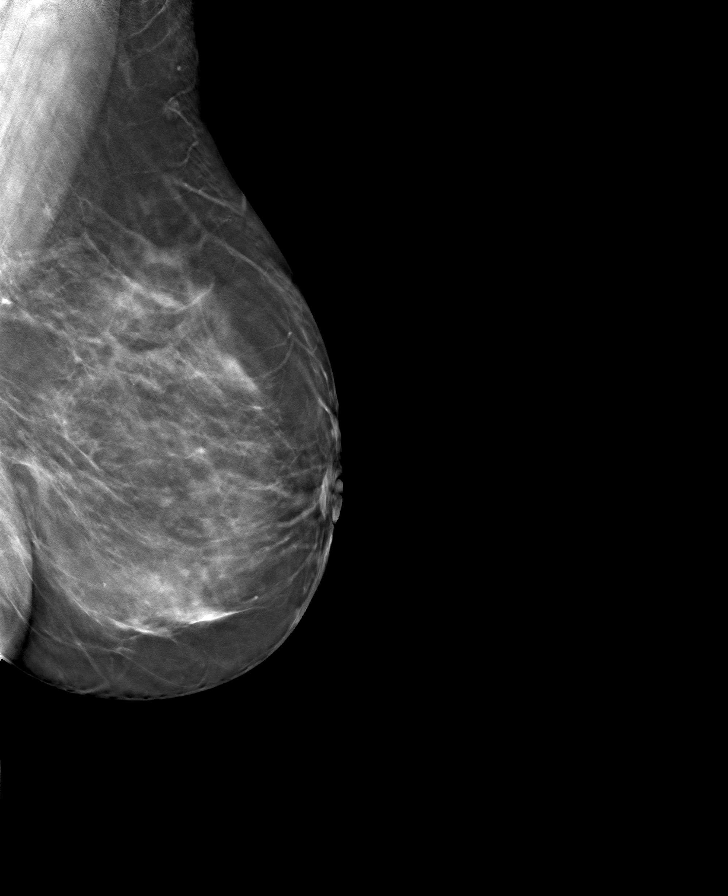

[R MLO tomo · tomo slice 54/107.0]
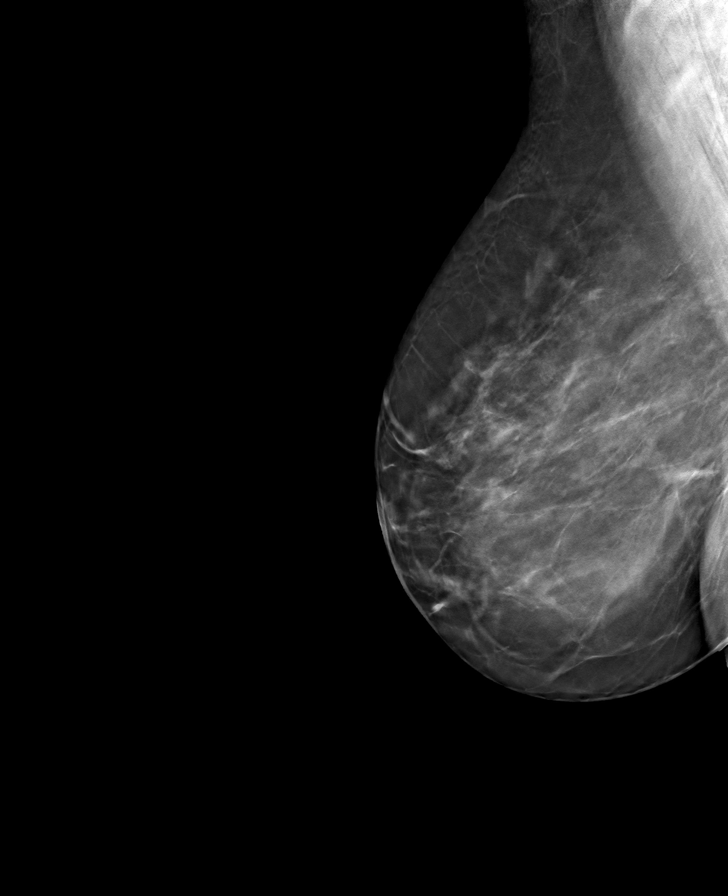

[R CC tomo · tomo slice 49/97.0]
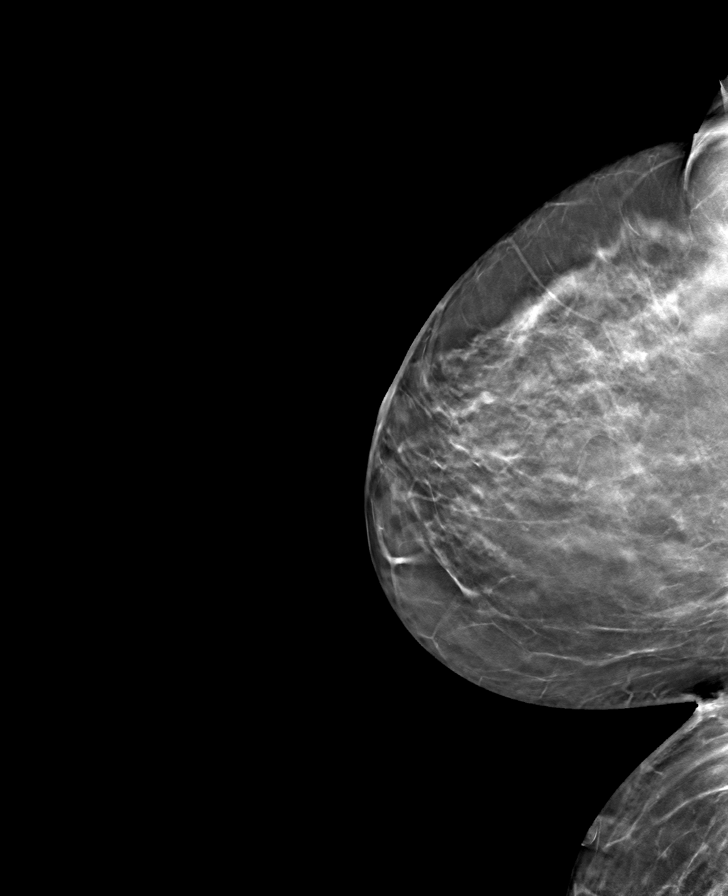

[L CC tomo · tomo slice 53/104.0]
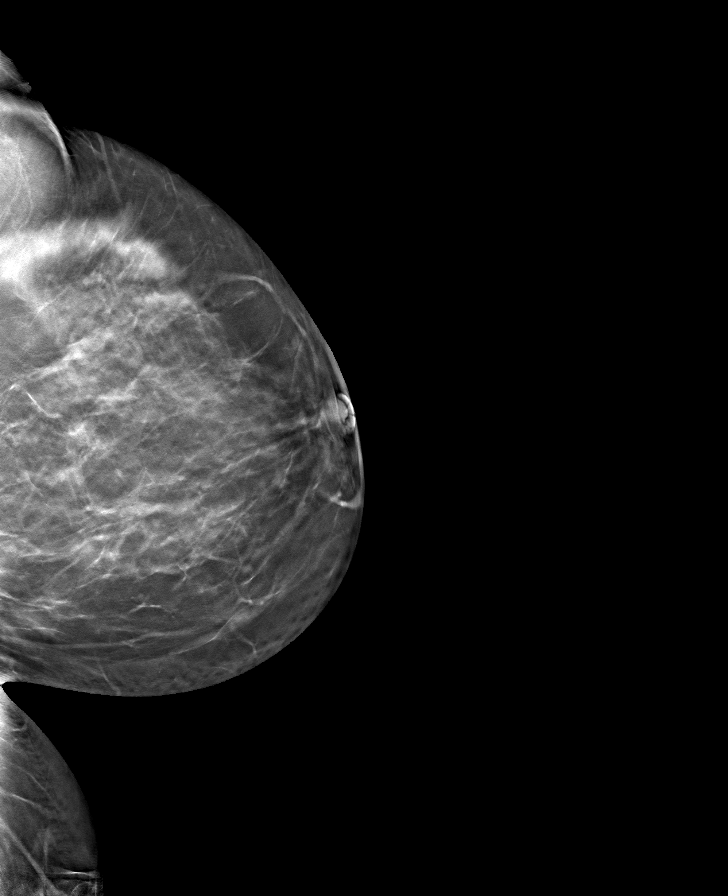

[8 of 24 positions shown; findings below may reference images not displayed]

ACR Breast Density Category b: There are scattered areas of
fibroglandular density.
FINDINGS: 2D/3D full field views of both breasts demonstrate no suspicious
mass, distortion or worrisome calcifications.

Mammographic images were processed with CAD.
IMPRESSION: No evidence of breast malignancy.

RECOMMENDATION:
Bilateral screening mammogram in 1 year.

I have discussed the findings, causes of breast pain and
recommendations with the patient. If applicable, a reminder letter
will be sent to the patient regarding the next appointment.

BI-RADS CATEGORY  1: Negative.

## 2022-03-01 ENCOUNTER — Other Ambulatory Visit: Payer: Self-pay

## 2022-03-28 ENCOUNTER — Other Ambulatory Visit: Payer: Self-pay

## 2022-03-28 ENCOUNTER — Ambulatory Visit: Payer: BLUE CROSS/BLUE SHIELD | Attending: Family Medicine | Admitting: Family Medicine

## 2022-03-28 ENCOUNTER — Encounter: Payer: Self-pay | Admitting: Family Medicine

## 2022-03-28 VITALS — BP 110/73 | HR 81 | Ht 62.0 in | Wt 162.8 lb

## 2022-03-28 DIAGNOSIS — N951 Menopausal and female climacteric states: Secondary | ICD-10-CM

## 2022-03-28 DIAGNOSIS — R5383 Other fatigue: Secondary | ICD-10-CM

## 2022-03-28 DIAGNOSIS — E038 Other specified hypothyroidism: Secondary | ICD-10-CM | POA: Diagnosis not present

## 2022-03-28 DIAGNOSIS — Z131 Encounter for screening for diabetes mellitus: Secondary | ICD-10-CM

## 2022-03-28 MED ORDER — PAROXETINE HCL 10 MG PO TABS
10.0000 mg | ORAL_TABLET | Freq: Every day | ORAL | 1 refills | Status: DC
  Filled 2022-03-28: qty 90, 90d supply, fill #0
  Filled 2023-02-28: qty 90, 90d supply, fill #1

## 2022-03-28 NOTE — Progress Notes (Signed)
Subjective:  Patient ID: Kaitlyn Wise, female    DOB: [birth year]  Age: 51 y.o. MRN: [id number]  CC: Hypothyroidism   HPI Kaitlyn Wise is a 52 y.o. year old female with a history of hypothyroidism, Depression, history of COVID in 05/2020 who presents today for a follow up visit.   Interval History:  She Complains of hot flashes which occur a lot at night and she has to use the fan a lot which leads to her having a cold. Daytime hot flashes are not as bad as nigh time symptoms. Symptoms have been present x 6 months. She Complains of exhaustion as well.  For her depression she is not on any medications as she states depression is controlled. For hypothyroidism she remains on levothyroxine. Past Medical History:  Diagnosis Date   Allergy    Anemia    Anxiety    Arthritis    Depression    Dyslipidemia    GERD (gastroesophageal reflux disease)    Thyroid disease    Uterine fibroid    Vertigo     Past Surgical History:  Procedure Laterality Date   CHOLECYSTECTOMY      Family History  Problem Relation Age of Onset   Diabetes Mother    Hypertension Mother    Diabetes Father    Hypertension Father    Heart disease Brother    Colon cancer Neg Hx    Esophageal cancer Neg Hx    Stomach cancer Neg Hx     Social History   Socioeconomic History   Marital status: Widowed    Spouse name: Not on file   Number of children: Not on file   Years of education: Not on file   Highest education level: Not on file  Occupational History   Not on file  Tobacco Use   Smoking status: Never   Smokeless tobacco: Never  Vaping Use   Vaping Use: Never used  Substance and Sexual Activity   Alcohol use: No   Drug use: No   Sexual activity: Not Currently  Other Topics Concern   Not on file  Social History Narrative   Not on file   Social Determinants of Health   Financial Resource Strain: Not on file  Food Insecurity: No Food Insecurity (12/23/2019)    Hunger Vital Sign    Worried About Running Out of Food in the Last Year: Never true    Ran Out of Food in the Last Year: Never true  Transportation Needs: No Transportation Needs (12/23/2019)   PRAPARE - Hydrologist (Medical): No    Lack of Transportation (Non-Medical): No  Physical Activity: Not on file  Stress: Not on file  Social Connections: Not on file    Allergies  Allergen Reactions   Egg-Derived Products    Fish Allergy Itching   Milk-Related Compounds Diarrhea and Itching    Outpatient Medications Prior to Visit  Medication Sig Dispense Refill   levothyroxine (SYNTHROID) 112 MCG tablet Take 1 tablet (112 mcg total) by mouth daily. 30 tablet 3   acetaminophen (TYLENOL) 500 MG tablet Take 2 tablets (1,000 mg total) by mouth every 6 (six) hours as needed. (Patient not taking: Reported on 03/28/2022) 30 tablet 0   carbamide peroxide (DEBROX) 6.5 % OTIC solution Place 5 drops into the right ear 2 (two) times daily. (Patient not taking: Reported on 05/17/2021) 15 mL 0   cetirizine (ZYRTEC) 10 MG tablet Take 1 tablet (10  mg total) by mouth daily. (Patient not taking: Reported on 05/17/2021) 30 tablet 1   cholestyramine (QUESTRAN) 4 g packet Take 1 packet (4 g total) by mouth 2 (two) times daily. (Patient not taking: Reported on 04/03/2021) 60 each 1   dicyclomine (BENTYL) 10 MG capsule TAKE 1 CAPSULE (10 MG TOTAL) BY MOUTH 2 (TWO) TIMES DAILY AS NEEDED FOR SPASMS. (Patient not taking: Reported on 05/17/2021) 60 capsule 2   ibuprofen (ADVIL) 600 MG tablet Take 1 tablet (600 mg total) by mouth every 6 (six) hours as needed. (Patient not taking: Reported on 03/28/2022) 30 tablet 0   meclizine (ANTIVERT) 25 MG tablet Take 1 tablet (25 mg total) by mouth 2 (two) times daily as needed for dizziness. (Patient not taking: Reported on 03/28/2022) 30 tablet 1   norethindrone (AYGESTIN) 5 MG tablet Take 5 mg by mouth in the morning and at bedtime. (Patient not taking:  Reported on 05/17/2021)     omeprazole (PRILOSEC) 20 MG capsule Take 1 capsule (20 mg total) by mouth daily. (Patient not taking: Reported on 03/28/2022) 60 capsule 1   ondansetron (ZOFRAN) 4 MG tablet Take 1 tablet (4 mg total) by mouth every 6 (six) hours. (Patient not taking: Reported on 03/28/2022) 12 tablet 0   No facility-administered medications prior to visit.     ROS Review of Systems  Constitutional:  Positive for fatigue. Negative for activity change and appetite change.  HENT:  Negative for sinus pressure and sore throat.   Respiratory:  Negative for chest tightness, shortness of breath and wheezing.   Cardiovascular:  Negative for chest pain and palpitations.  Gastrointestinal:  Negative for abdominal distention, abdominal pain and constipation.  Genitourinary: Negative.   Musculoskeletal: Negative.   Psychiatric/Behavioral:  Negative for behavioral problems and dysphoric mood.     Objective:  BP 110/73   Pulse 81   Ht '5\' 2"'$  (1.575 m)   Wt 162 lb 12.8 oz (73.8 kg)   SpO2 99%   BMI 29.78 kg/m      03/28/2022    2:37 PM 11/29/2021    4:54 PM 11/29/2021   12:32 PM  BP/Weight  Systolic BP [id number]  Diastolic BP 73 68 71  Wt. (Lbs) 162.8    BMI 29.78 kg/m2        Physical Exam Constitutional:      Appearance: She is well-developed.  Cardiovascular:     Rate and Rhythm: Normal rate.     Heart sounds: Normal heart sounds. No murmur heard. Pulmonary:     Effort: Pulmonary effort is normal.     Breath sounds: Normal breath sounds. No wheezing or rales.  Chest:     Chest wall: No tenderness.  Abdominal:     General: Bowel sounds are normal. There is no distension.     Palpations: Abdomen is soft. There is no mass.     Tenderness: There is no abdominal tenderness.  Musculoskeletal:        General: Normal range of motion.     Right lower leg: No edema.     Left lower leg: No edema.  Neurological:     Mental Status: She is alert and oriented to person,  place, and time.  Psychiatric:        Mood and Affect: Mood normal.        Latest Ref Rng & Units 11/29/2021   12:46 AM 05/22/2021    8:53 PM 01/17/2021    3:10 PM  CMP  Glucose  70 - 99 mg/dL 138  102  86   BUN 6 - 20 mg/dL '20  17  15   '$ Creatinine 0.44 - 1.00 mg/dL 1.10  0.74  0.63   Sodium 135 - 145 mmol/L 140  138  138   Potassium 3.5 - 5.1 mmol/L 4.1  4.3  4.2   Chloride 98 - 111 mmol/L 104  107  105   CO2 22 - 32 mmol/L '20  25  27   '$ Calcium 8.9 - 10.3 mg/dL 9.9  9.1  9.5   Total Protein 6.5 - 8.1 g/dL 8.1   7.2   Total Bilirubin 0.3 - 1.2 mg/dL 0.2   0.2   Alkaline Phos 38 - 126 U/L 122   126   AST 15 - 41 U/L 26   14   ALT 0 - 44 U/L 19   14     Lipid Panel     Component Value Date/Time   CHOL 186 09/12/2020 0945   TRIG 100 09/12/2020 0945   HDL 49 09/12/2020 0945   CHOLHDL 3.8 09/12/2020 0945   CHOLHDL 2.9 08/25/2015 1111   VLDL 17 08/25/2015 1111   LDLCALC 119 (H) 09/12/2020 0945    CBC    Component Value Date/Time   WBC 8.4 11/29/2021 0046   RBC 5.04 11/29/2021 0046   HGB 13.5 11/29/2021 0046   HGB 11.0 (L) 09/12/2020 0945   HCT 43.4 11/29/2021 0046   HCT 37.4 09/12/2020 0945   PLT 275 11/29/2021 0046   PLT 360 09/12/2020 0945   MCV 86.1 11/29/2021 0046   MCV 75 (L) 09/12/2020 0945   MCH 26.8 11/29/2021 0046   MCHC 31.1 11/29/2021 0046   RDW 16.2 (H) 11/29/2021 0046   RDW 16.2 (H) 09/12/2020 0945   LYMPHSABS 2.5 11/29/2021 0046   LYMPHSABS 1.7 09/12/2020 0945   MONOABS 0.4 11/29/2021 0046   EOSABS 0.4 11/29/2021 0046   EOSABS 0.4 09/12/2020 0945   BASOSABS 0.1 11/29/2021 0046   BASOSABS 0.1 09/12/2020 0945    Lab Results  Component Value Date   HGBA1C 5.4 (A) 06/19/2021   Lab Results  Component Value Date   TSH 0.037 (L) 08/01/2021     Assessment & Plan:  1. Other specified hypothyroidism Last TSH was suppressed Will check thyroid labs and adjust regimen accordingly - T4, free - TSH - T3 - LP+Non-HDL Cholesterol - CMP14+EGFR -  CBC with Differential/Platelet  2. Vasomotor symptoms due to menopause Counseled on available treatment options including hormonal and nonhormonal treatment options Will place on Paxil especially with underlying history of depression - PARoxetine (PAXIL) 10 MG tablet; Take 1 tablet (10 mg total) by mouth daily. For hot flashes  Dispense: 90 tablet; Refill: 1  3. Screening for diabetes mellitus - Hemoglobin A1c  4. Other fatigue Menopausal symptoms could be playing a role - VITAMIN D 25 Hydroxy (Vit-D Deficiency, Fractures)   Meds ordered this encounter  Medications   PARoxetine (PAXIL) 10 MG tablet    Sig: Take 1 tablet (10 mg total) by mouth daily. For hot flashes    Dispense:  90 tablet    Refill:  1          Charlott Rakes, MD, FAAFP. [facility], [place]   03/28/2022, 3:59 PM

## 2022-03-28 NOTE — Patient Instructions (Signed)
Menopause Menopause is the normal time of a woman's life when menstrual periods stop completely. It marks the natural end to a woman's ability to become pregnant. It can be defined as the absence of a menstrual period for 12 months without another medical cause. The transition to menopause (perimenopause) most often happens between the ages of 45 and 55, and can last for many years. During perimenopause, hormone levels change in your body, which can cause symptoms and affect your health. Menopause may increase your risk for: Weakened bones (osteoporosis), which causes fractures. Depression. Hardening and narrowing of the arteries (atherosclerosis), which can cause heart attacks and strokes. What are the causes? This condition is usually caused by a natural change in hormone levels that happens as you get older. The condition may also be caused by changes that are not natural, including: Surgery to remove both ovaries (surgical menopause). Side effects from some medicines, such as chemotherapy used to treat cancer (chemical menopause). What increases the risk? This condition is more likely to start at an earlier age if you have certain medical conditions or have undergone treatments, including: A tumor of the pituitary gland in the brain. A disease that affects the ovaries and hormones. Certain cancer treatments, such as chemotherapy or hormone therapy, or radiation therapy on the pelvis. Heavy smoking and excessive alcohol use. Family history of early menopause. This condition is also more likely to develop earlier in women who are very thin. What are the signs or symptoms? Symptoms of this condition include: Hot flashes. Irregular menstrual periods. Night sweats. Changes in feelings about sex. This could be a decrease in sex drive or an increased discomfort around your sexuality. Vaginal dryness and thinning of the vaginal walls. This may cause painful sex. Dryness of the skin and  development of wrinkles. Headaches. Problems sleeping (insomnia). Mood swings or irritability. Memory problems. Weight gain. Hair growth on the face and chest. Bladder infections or problems with urinating. How is this diagnosed? This condition is diagnosed based on your medical history, a physical exam, your age, your menstrual history, and your symptoms. Hormone tests may also be done. How is this treated? In some cases, no treatment is needed. You and your health care provider should make a decision together about whether treatment is necessary. Treatment will be based on your individual condition and preferences. Treatment for this condition focuses on managing symptoms. Treatment may include: Menopausal hormone therapy (MHT). Medicines to treat specific symptoms or complications. Acupuncture. Vitamin or herbal supplements. Before starting treatment, make sure to let your health care provider know if you have a personal or family history of these conditions: Heart disease. Breast cancer. Blood clots. Diabetes. Osteoporosis. Follow these instructions at home: Lifestyle Do not use any products that contain nicotine or tobacco, such as cigarettes, e-cigarettes, and chewing tobacco. If you need help quitting, ask your health care provider. Get at least 30 minutes of physical activity on 5 or more days each week. Avoid alcoholic and caffeinated beverages, as well as spicy foods. This may help prevent hot flashes. Get 7-8 hours of sleep each night. If you have hot flashes, try: Dressing in layers. Avoiding things that may trigger hot flashes, such as spicy food, warm places, or stress. Taking slow, deep breaths when a hot flash starts. Keeping a fan in your home and office. Find ways to manage stress, such as deep breathing, meditation, or journaling. Consider going to group therapy with other women who are having menopause symptoms. Ask your health care   provider about recommended  group therapy meetings. Eating and drinking  Eat a healthy, balanced diet that contains whole grains, lean protein, low-fat dairy, and plenty of fruits and vegetables. Your health care provider may recommend adding more soy to your diet. Foods that contain soy include tofu, tempeh, and soy milk. Eat plenty of foods that contain calcium and vitamin D for bone health. Items that are rich in calcium include low-fat milk, yogurt, beans, almonds, sardines, broccoli, and kale. Medicines Take over-the-counter and prescription medicines only as told by your health care provider. Talk with your health care provider before starting any herbal supplements. If prescribed, take vitamins and supplements as told by your health care provider. General instructions  Keep track of your menstrual periods, including: When they occur. How heavy they are and how long they last. How much time passes between periods. Keep track of your symptoms, noting when they start, how often you have them, and how long they last. Use vaginal lubricants or moisturizers to help with vaginal dryness and improve comfort during sex. Keep all follow-up visits. This is important. This includes any group therapy or counseling. Contact a health care provider if: You are still having menstrual periods after age 55. You have pain during sex. You have not had a period for 12 months and you develop vaginal bleeding. Get help right away if you have: Severe depression. Excessive vaginal bleeding. Pain when you urinate. A fast or irregular heartbeat (palpitations). Severe headaches. Abdominal pain or severe indigestion. Summary Menopause is a normal time of life when menstrual periods stop completely. It is usually defined as the absence of a menstrual period for 12 months without another medical cause. The transition to menopause (perimenopause) most often happens between the ages of 45 and 55 and can last for several years. Symptoms  can be managed through medicines, lifestyle changes, and complementary therapies such as acupuncture. Eat a balanced diet that is rich in nutrients to promote bone health and heart health and to manage symptoms during menopause. This information is not intended to replace advice given to you by your health care provider. Make sure you discuss any questions you have with your health care provider. Document Revised: 10/02/2019 Document Reviewed: 06/18/2019 Elsevier Patient Education  2023 Elsevier Inc.  

## 2022-03-29 ENCOUNTER — Other Ambulatory Visit: Payer: Self-pay | Admitting: Family Medicine

## 2022-03-29 ENCOUNTER — Other Ambulatory Visit: Payer: Self-pay

## 2022-03-29 DIAGNOSIS — E038 Other specified hypothyroidism: Secondary | ICD-10-CM

## 2022-03-29 LAB — T3: T3, Total: 130 ng/dL (ref 71–180)

## 2022-03-29 LAB — CBC WITH DIFFERENTIAL/PLATELET
Basophils Absolute: 0.1 10*3/uL (ref 0.0–0.2)
Basos: 2 %
EOS (ABSOLUTE): 0.4 10*3/uL (ref 0.0–0.4)
Eos: 7 %
Hematocrit: 42.4 % (ref 34.0–46.6)
Hemoglobin: 13.7 g/dL (ref 11.1–15.9)
Immature Grans (Abs): 0 10*3/uL (ref 0.0–0.1)
Immature Granulocytes: 0 %
Lymphocytes Absolute: 2.3 10*3/uL (ref 0.7–3.1)
Lymphs: 41 %
MCH: 27.3 pg (ref 26.6–33.0)
MCHC: 32.3 g/dL (ref 31.5–35.7)
MCV: 85 fL (ref 79–97)
Monocytes Absolute: 0.3 10*3/uL (ref 0.1–0.9)
Monocytes: 6 %
Neutrophils Absolute: 2.5 10*3/uL (ref 1.4–7.0)
Neutrophils: 44 %
Platelets: 307 10*3/uL (ref 150–450)
RBC: 5.02 x10E6/uL (ref 3.77–5.28)
RDW: 13.8 % (ref 11.7–15.4)
WBC: 5.6 10*3/uL (ref 3.4–10.8)

## 2022-03-29 LAB — CMP14+EGFR
ALT: 21 IU/L (ref 0–32)
AST: 24 IU/L (ref 0–40)
Albumin/Globulin Ratio: 1.4 (ref 1.2–2.2)
Albumin: 4.3 g/dL (ref 3.9–4.9)
Alkaline Phosphatase: 144 IU/L — ABNORMAL HIGH (ref 44–121)
BUN/Creatinine Ratio: 19 (ref 9–23)
BUN: 15 mg/dL (ref 6–24)
Bilirubin Total: 0.3 mg/dL (ref 0.0–1.2)
CO2: 25 mmol/L (ref 20–29)
Calcium: 10.4 mg/dL — ABNORMAL HIGH (ref 8.7–10.2)
Chloride: 104 mmol/L (ref 96–106)
Creatinine, Ser: 0.78 mg/dL (ref 0.57–1.00)
Globulin, Total: 3.1 g/dL (ref 1.5–4.5)
Glucose: 90 mg/dL (ref 70–99)
Potassium: 4.8 mmol/L (ref 3.5–5.2)
Sodium: 141 mmol/L (ref 134–144)
Total Protein: 7.4 g/dL (ref 6.0–8.5)
eGFR: 93 mL/min/{1.73_m2} (ref >59)

## 2022-03-29 LAB — T4, FREE: Free T4: 1.84 ng/dL — ABNORMAL HIGH (ref 0.82–1.77)

## 2022-03-29 LAB — TSH: TSH: 0.013 u[IU]/mL — ABNORMAL LOW (ref 0.450–4.500)

## 2022-03-29 LAB — LP+NON-HDL CHOLESTEROL
Cholesterol, Total: 203 mg/dL — ABNORMAL HIGH (ref 100–199)
HDL: 57 mg/dL (ref >39)
LDL Chol Calc (NIH): 127 mg/dL — ABNORMAL HIGH (ref 0–99)
Total Non-HDL-Chol (LDL+VLDL): 146 mg/dL — ABNORMAL HIGH (ref 0–129)
Triglycerides: 107 mg/dL (ref 0–149)
VLDL Cholesterol Cal: 19 mg/dL (ref 5–40)

## 2022-03-29 LAB — HEMOGLOBIN A1C
Est. average glucose Bld gHb Est-mCnc: 120 mg/dL
Hgb A1c MFr Bld: 5.8 % — ABNORMAL HIGH (ref 4.8–5.6)

## 2022-03-29 LAB — VITAMIN D 25 HYDROXY (VIT D DEFICIENCY, FRACTURES): Vit D, 25-Hydroxy: 14.2 ng/mL — ABNORMAL LOW (ref 30.0–100.0)

## 2022-03-29 MED ORDER — LEVOTHYROXINE SODIUM 100 MCG PO TABS
100.0000 ug | ORAL_TABLET | Freq: Every day | ORAL | 1 refills | Status: DC
  Filled 2022-03-29: qty 90, 90d supply, fill #0
  Filled 2022-06-28: qty 90, 90d supply, fill #1

## 2022-04-02 ENCOUNTER — Other Ambulatory Visit: Payer: Self-pay

## 2022-06-29 ENCOUNTER — Other Ambulatory Visit: Payer: Self-pay

## 2022-10-24 ENCOUNTER — Ambulatory Visit (HOSPITAL_COMMUNITY)
Admission: EM | Admit: 2022-10-24 | Discharge: 2022-10-24 | Disposition: A | Discharge status: Home / Self Care | Payer: BLUE CROSS/BLUE SHIELD | Attending: Internal Medicine | Admitting: Internal Medicine

## 2022-10-24 ENCOUNTER — Encounter (HOSPITAL_COMMUNITY): Payer: Self-pay

## 2022-10-24 DIAGNOSIS — M5442 Lumbago with sciatica, left side: Secondary | ICD-10-CM

## 2022-10-24 LAB — POCT URINE PREGNANCY: Preg Test, Ur: NEGATIVE

## 2022-10-24 LAB — POCT URINALYSIS DIP (MANUAL ENTRY)
Bilirubin, UA: NEGATIVE
Glucose, UA: NEGATIVE mg/dL
Leukocytes, UA: NEGATIVE
Nitrite, UA: NEGATIVE
Protein Ur, POC: NEGATIVE mg/dL
Spec Grav, UA: >=1.03 — AB (ref 1.010–1.025)
Urobilinogen, UA: 0.2 U/dL
pH, UA: 5.5 (ref 5.0–8.0)

## 2022-10-24 MED ORDER — CYCLOBENZAPRINE HCL 5 MG PO TABS
5.0000 mg | ORAL_TABLET | Freq: Three times a day (TID) | ORAL | 0 refills | Status: AC | PRN
  Filled 2022-10-24: qty 15, 5d supply, fill #0

## 2022-10-24 MED ORDER — DICLOFENAC SODIUM 75 MG PO TBEC
75.0000 mg | DELAYED_RELEASE_TABLET | Freq: Two times a day (BID) | ORAL | 0 refills | Status: AC
  Filled 2022-10-24: qty 20, 10d supply, fill #0

## 2022-10-24 NOTE — ED Provider Notes (Signed)
[facility]    CSN: [id number] Arrival date & time: 10/24/22  1719      History   Chief Complaint Chief Complaint  Patient presents with   Back Pain   Leg Pain    HPI Kaitlyn Wise is a 52 y.o. female.    Back Pain Associated symptoms: leg pain   Associated symptoms: no abdominal pain and no dysuria   Leg Pain Associated symptoms: back pain   Associated symptoms: no fatigue    Sided low back pain radiates down left leg, onset 4 months ago after helping friend move a piece of furniture.  Has been persistent, waxes and wanes, initially was more severe and now improved but continues.  Pain worsens with certain movements and bending.  Relieved somewhat with rest.  Denies paresthesias, weakness, abdominal pain, incontinence, urinary symptoms, bowel or bladder changes, history of prior back fractures or surgeries.  She takes ibuprofen occasionally for the pain.  She has not followed up with her primary care doctor due to financial issues. Is perimenopausal LMP March of last year  Past Medical History:  Diagnosis Date   Allergy    Anemia    Anxiety    Arthritis    Depression    Dyslipidemia    GERD (gastroesophageal reflux disease)    Thyroid disease    Uterine fibroid    Vertigo     Patient Active Problem List   Diagnosis Date Noted   Abnormal uterine bleeding (AUB) 11/25/2019   Chest pain in adult 08/01/2017   Costochondritis 08/01/2017   Current severe episode of major depressive disorder without psychotic features without prior episode (HCC) 08/01/2017   Mild episode of recurrent major depressive disorder (HCC) 01/18/2017   Migraine 12/05/2015   Gastritis and gastroduodenitis 03/09/2015   Dyslipidemia 06/16/2013   Hypothyroidism 05/21/2013    Past Surgical History:  Procedure Laterality Date   CHOLECYSTECTOMY      OB History     Gravida  1   Para  1   Term      Preterm      AB      Living  1      SAB      IAB       Ectopic      Multiple      Live Births  1            Home Medications    Prior to Admission medications   Medication Sig Start Date End Date Taking? Authorizing Provider  levothyroxine (SYNTHROID) 100 MCG tablet Take 1 tablet (100 mcg total) by mouth daily. 03/29/22  Yes Hoy Register, MD  PARoxetine (PAXIL) 10 MG tablet Take 1 tablet (10 mg total) by mouth daily. For hot flashes 03/28/22   Hoy Register, MD  FLUoxetine (PROZAC) 20 MG capsule Take 1 capsule (20 mg total) by mouth daily. Patient not taking: No sig reported 12/30/19 02/15/20  Hoy Register, MD  medroxyPROGESTERone (PROVERA) 10 MG tablet Take 1 tablet (10 mg total) by mouth daily. 09/04/19 09/08/19  Hoy Register, MD  prazosin (MINIPRESS) 1 MG capsule Take 1 capsule (1 mg total) by mouth at bedtime. 10/22/17 06/03/19  Hoy Register, MD  topiramate (TOPAMAX) 50 MG tablet Take 1 tablet (50 mg total) by mouth 2 (two) times daily. Patient not taking: Reported on 04/14/2016 11/29/15 06/03/19  Hoy Register, MD    Family History Family History  Problem Relation Age of Onset   Diabetes Mother  Hypertension Mother    Diabetes Father    Hypertension Father    Heart disease Brother    Colon cancer Neg Hx    Esophageal cancer Neg Hx    Stomach cancer Neg Hx     Social History Social History   Tobacco Use   Smoking status: Never   Smokeless tobacco: Never  Vaping Use   Vaping status: Never Used  Substance Use Topics   Alcohol use: No   Drug use: No     Allergies   Egg-derived products, Fish allergy, and Milk-related compounds   Review of Systems Review of Systems  Constitutional:  Negative for chills and fatigue.  Respiratory:  Negative for shortness of breath.   Gastrointestinal:  Negative for abdominal pain, diarrhea, nausea and vomiting.  Genitourinary:  Negative for dysuria, frequency, urgency, vaginal bleeding and vaginal discharge.  Musculoskeletal:  Positive for back pain.      Physical Exam Triage Vital Signs ED Triage Vitals  Encounter Vitals Group     BP 10/24/22 1742 125/80     Systolic BP Percentile --      Diastolic BP Percentile --      Pulse Rate 10/24/22 1742 79     Resp 10/24/22 1742 18     Temp 10/24/22 1742 98.4 F (36.9 C)     Temp Source 10/24/22 1742 Oral     SpO2 10/24/22 1742 98 %     Weight --      Height --      Head Circumference --      Peak Flow --      Pain Score 10/24/22 1741 8     Pain Loc --      Pain Education --      Exclude from Growth Chart --    No data found.  Updated Vital Signs BP 125/80 (BP Location: Left Arm)   Pulse 79   Temp 98.4 F (36.9 C) (Oral)   Resp 18   LMP 03/15/2021 (Approximate)   SpO2 98%   Visual Acuity Right Eye Distance:   Left Eye Distance:   Bilateral Distance:    Right Eye Near:   Left Eye Near:    Bilateral Near:     Physical Exam Constitutional:      Appearance: She is not ill-appearing.  HENT:     Head: Normocephalic and atraumatic.     Right Ear: Tympanic membrane normal.     Left Ear: Tympanic membrane normal.     Mouth/Throat:     Mouth: Mucous membranes are moist.  Cardiovascular:     Rate and Rhythm: Normal rate and regular rhythm.     Heart sounds: Normal heart sounds.  Pulmonary:     Effort: Pulmonary effort is normal.     Breath sounds: Normal breath sounds.  Abdominal:     General: Bowel sounds are normal.     Palpations: Abdomen is soft.     Tenderness: There is no abdominal tenderness. There is no right CVA tenderness, left CVA tenderness or guarding.  Musculoskeletal:     Thoracic back: No bony tenderness.     Lumbar back: No bony tenderness. Negative right straight leg raise test and negative left straight leg raise test.  Neurological:     Mental Status: She is alert and oriented to person, place, and time.     Motor: No weakness.     Gait: Gait normal.     Deep Tendon Reflexes: Reflexes normal.  UC Treatments / Results  Labs (all  labs ordered are listed, but only abnormal results are displayed) Labs Reviewed  POCT URINE PREGNANCY  POCT URINALYSIS DIP (MANUAL ENTRY)    EKG   Radiology No results found.  Procedures Procedures (including critical care time)  Medications Ordered in UC Medications - No data to display  Initial Impression / Assessment and Plan / UC Course  I have reviewed the triage vital signs and the nursing notes.  Pertinent labs & imaging results that were available during my care of the patient were reviewed by me and considered in my medical decision making (see chart for details).     52 year old female with low back pain radiating down left leg x 4 months consistent with sciatica.  No red flags, no significant abnormalities on exam, will treat with course of NSAIDs and muscle relaxers, recommend home exercises.  If no improvement should follow-up with PCP for further evaluation and possible referral for physical therapy Final Clinical Impressions(s) / UC Diagnoses   Final diagnoses:  None   Discharge Instructions   None    ED Prescriptions   None    PDMP not reviewed this encounter.   Meliton Rattan, [place] 10/24/22 1801

## 2022-10-24 NOTE — Discharge Instructions (Addendum)
Take the medications as prescribed, follow-up with your primary care doctor in 2 weeks if no improvement for further evaluation and possible referral for physical therapy

## 2022-10-24 NOTE — ED Triage Notes (Signed)
Pt presents with lower back pain x 1 month.Pt reports right leg pain. Denies any recent falls or trauma.

## 2022-10-25 ENCOUNTER — Other Ambulatory Visit: Payer: Self-pay

## 2022-11-15 ENCOUNTER — Ambulatory Visit: Payer: BLUE CROSS/BLUE SHIELD | Admitting: Family Medicine

## 2022-12-10 ENCOUNTER — Other Ambulatory Visit: Payer: Self-pay | Admitting: Family Medicine

## 2022-12-10 ENCOUNTER — Ambulatory Visit: Payer: BLUE CROSS/BLUE SHIELD | Admitting: Nurse Practitioner

## 2022-12-10 DIAGNOSIS — E038 Other specified hypothyroidism: Secondary | ICD-10-CM

## 2022-12-11 ENCOUNTER — Other Ambulatory Visit: Payer: Self-pay

## 2022-12-11 MED ORDER — LEVOTHYROXINE SODIUM 100 MCG PO TABS
100.0000 ug | ORAL_TABLET | Freq: Every day | ORAL | 0 refills | Status: DC
  Filled 2022-12-11: qty 90, 90d supply, fill #0

## 2023-02-01 ENCOUNTER — Encounter: Payer: Self-pay | Admitting: Nurse Practitioner

## 2023-02-01 ENCOUNTER — Other Ambulatory Visit: Payer: Self-pay

## 2023-02-01 ENCOUNTER — Ambulatory Visit: Payer: No Typology Code available for payment source | Attending: Nurse Practitioner | Admitting: Nurse Practitioner

## 2023-02-01 VITALS — BP 93/60 | HR 89 | Resp 20 | Ht 62.0 in | Wt 179.0 lb

## 2023-02-01 DIAGNOSIS — R1013 Epigastric pain: Secondary | ICD-10-CM | POA: Diagnosis not present

## 2023-02-01 DIAGNOSIS — M5442 Lumbago with sciatica, left side: Secondary | ICD-10-CM

## 2023-02-01 DIAGNOSIS — Z139 Encounter for screening, unspecified: Secondary | ICD-10-CM

## 2023-02-01 DIAGNOSIS — G8929 Other chronic pain: Secondary | ICD-10-CM

## 2023-02-01 DIAGNOSIS — E038 Other specified hypothyroidism: Secondary | ICD-10-CM | POA: Diagnosis not present

## 2023-02-01 DIAGNOSIS — Z1231 Encounter for screening mammogram for malignant neoplasm of breast: Secondary | ICD-10-CM | POA: Diagnosis not present

## 2023-02-01 DIAGNOSIS — L299 Pruritus, unspecified: Secondary | ICD-10-CM

## 2023-02-01 MED ORDER — NEOMYCIN-POLYMYXIN-HC 1 % OT SOLN
3.0000 [drp] | Freq: Four times a day (QID) | OTIC | 0 refills | Status: DC
  Filled 2023-02-01: qty 10, 17d supply, fill #0

## 2023-02-01 MED ORDER — GABAPENTIN 300 MG PO CAPS
300.0000 mg | ORAL_CAPSULE | Freq: Two times a day (BID) | ORAL | 3 refills | Status: DC
  Filled 2023-02-01: qty 60, 30d supply, fill #0
  Filled 2023-02-28: qty 60, 30d supply, fill #1

## 2023-02-01 MED ORDER — PANTOPRAZOLE SODIUM 40 MG PO TBEC
40.0000 mg | DELAYED_RELEASE_TABLET | Freq: Every day | ORAL | 3 refills | Status: DC
  Filled 2023-02-01: qty 30, 30d supply, fill #0
  Filled 2023-02-28: qty 30, 30d supply, fill #1

## 2023-02-01 NOTE — Progress Notes (Unsigned)
Assessment & Plan:  Kaitlyn Wise was seen today for medical management of chronic issues.  Diagnoses and all orders for this visit:  Other specified hypothyroidism DOSE CHANGE. Return in 4-6 weeks for labs -     Thyroid Panel With TSH -     CMP14+EGFR  Encounter for screening mammogram for malignant neoplasm of breast -     MS 3D SCR MAMMO BILAT BR (aka MM); Future  Encounter for screening involving social determinants of health (SDoH) -     AMB Referral VBCI Care Management  Epigastric pain -     Ambulatory referral to Gastroenterology -     pantoprazole (PROTONIX) 40 MG tablet; Take 1 tablet (40 mg total) by mouth daily.  Chronic midline low back pain with left-sided sciatica -     DG Lumbar Spine Complete; Future -     gabapentin (NEURONTIN) 300 MG capsule; Take 1 capsule (300 mg total) by mouth 2 (two) times daily. For back pain  Ear itch -     NEOMYCIN-POLYMYXIN-HYDROCORTISONE (CORTISPORIN) 1 % SOLN OTIC solution; Place 3 drops into the left ear 4 (four) times daily.    Patient has been counseled on age-appropriate routine health concerns for screening and prevention. These are reviewed and up-to-date. Referrals have been placed accordingly. Immunizations are up-to-date or declined.    Subjective:   Chief Complaint  Patient presents with   Medical Management of Chronic Issues    Kaitlyn Wise 53 y.o. female presents to office today for hypothyroidism and back pain.   VRI was used to communicate directly with patient for the entire encounter including providing detailed patient instructions.    She is a patient of Dr. Alvis Lemmings  She did not return to the lab to have her thyroid checked from March.  Only restarted taking thyroid medication last month. Prior to last month she had been out for over a month. States she did not have insurance or money to pick up the medication Lab Results  Component Value Date   TSH 0.256 (L) 02/01/2023    She has been  experiencing abdominal pain and constipation. She states she always has problems with her colon. She has a history of postcholcystectomy diarrhea. Last colonoscopy 05-2021 with repeat in 7 years.  She has a sharp pain in her colon that she takes ibuprofen for. She reports GERD symptoms which she tries to control with almond milk. She stopped taking PPI. Was on pantoprazole and omeprazole in the past. States both were  ineffective.  Pain is located mostly in the epigastric.  Aggravtaed by coffee, tea, sodas, fatty fried foods.     She is also upset that she is not losing weight however she is not exercising due to back pain. States she has sciatica in the lumbar region with radiating pain to the left leg. Onset of pain May 2024 after helping a friend lift a heavy object.  She has never discussed this with her provider.   Notes itching in the left ear. Denies ear pain or drainage.     Review of Systems  Constitutional:  Negative for fever, malaise/fatigue and weight loss.  HENT: Negative.  Negative for nosebleeds.   Eyes: Negative.  Negative for blurred vision, double vision and photophobia.  Respiratory: Negative.  Negative for cough and shortness of breath.   Cardiovascular: Negative.  Negative for chest pain, palpitations and leg swelling.  Gastrointestinal:  Positive for abdominal pain, constipation and heartburn. Negative for blood in stool, diarrhea, melena,  nausea and vomiting.  Genitourinary: Negative.   Musculoskeletal:  Positive for back pain. Negative for myalgias.  Neurological: Negative.  Negative for dizziness, focal weakness, seizures and headaches.  Psychiatric/Behavioral: Negative.  Negative for suicidal ideas.     Past Medical History:  Diagnosis Date   Allergy    Anemia    Anxiety    Arthritis    Depression    Dyslipidemia    GERD (gastroesophageal reflux disease)    Thyroid disease    Uterine fibroid    Vertigo     Past Surgical History:  Procedure Laterality  Date   CHOLECYSTECTOMY      Family History  Problem Relation Age of Onset   Diabetes Mother    Hypertension Mother    Diabetes Father    Hypertension Father    Heart disease Brother    Colon cancer Neg Hx    Esophageal cancer Neg Hx    Stomach cancer Neg Hx     Social History Reviewed with no changes to be made today.   Outpatient Medications Prior to Visit  Medication Sig Dispense Refill   levothyroxine (SYNTHROID) 100 MCG tablet Take 1 tablet (100 mcg total) by mouth daily. 90 tablet 0   PARoxetine (PAXIL) 10 MG tablet Take 1 tablet (10 mg total) by mouth daily. For hot flashes (Patient not taking: Reported on 02/01/2023) 90 tablet 1   No facility-administered medications prior to visit.    Allergies  Allergen Reactions   Egg-Derived Products    Fish Allergy Itching   Milk-Related Compounds Diarrhea and Itching       Objective:    BP 93/60 (BP Location: Left Arm, Patient Position: Sitting, Cuff Size: Normal)   Pulse 89   Resp 20   Ht 5\' 2"  (1.575 m)   Wt 179 lb (81.2 kg)   SpO2 98%   BMI 32.74 kg/m  Wt Readings from Last 3 Encounters:  02/01/23 179 lb (81.2 kg)  03/28/22 162 lb 12.8 oz (73.8 kg)  11/29/21 160 lb (72.6 kg)    Physical Exam Vitals and nursing note reviewed.  Constitutional:      Appearance: She is well-developed.  HENT:     Head: Normocephalic and atraumatic.     Right Ear: Hearing, tympanic membrane, ear canal and external ear normal.     Ears:     Comments: Dried epithelial cell debris in ear canal Cardiovascular:     Rate and Rhythm: Normal rate and regular rhythm.     Heart sounds: Normal heart sounds. No murmur heard.    No friction rub. No gallop.  Pulmonary:     Effort: Pulmonary effort is normal. No tachypnea or respiratory distress.     Breath sounds: Normal breath sounds. No decreased breath sounds, wheezing, rhonchi or rales.  Chest:     Chest wall: No tenderness.  Abdominal:     General: Bowel sounds are normal.      Palpations: Abdomen is soft.  Musculoskeletal:        General: Normal range of motion.     Cervical back: Normal range of motion.  Skin:    General: Skin is warm and dry.  Neurological:     Mental Status: She is alert and oriented to person, place, and time.     Coordination: Coordination normal.  Psychiatric:        Behavior: Behavior normal. Behavior is cooperative.        Thought Content: Thought content normal.  Judgment: Judgment normal.          Patient has been counseled extensively about nutrition and exercise as well as the importance of adherence with medications and regular follow-up. The patient was given clear instructions to go to ER or return to medical center if symptoms don't improve, worsen or new problems develop. The patient verbalized understanding.   Follow-up: Return in about 3 months (around 05/02/2023) for f/u with PCP  .   Claiborne Rigg, FNP-BC [facility] [place], [place] [phone]   02/03/2023, 12:40 PM

## 2023-02-01 NOTE — Progress Notes (Unsigned)
Patient states that she has been experiencing sciatic nerve pain.  Abdominal discomfort with constipation.

## 2023-02-01 NOTE — Patient Instructions (Signed)
BACK XRAY DRI [facility] [address]  [phone]

## 2023-02-02 LAB — CMP14+EGFR
ALT: 22 [IU]/L (ref 0–32)
AST: 22 [IU]/L (ref 0–40)
Albumin: 4.2 g/dL (ref 3.9–4.9)
Alkaline Phosphatase: 164 [IU]/L — ABNORMAL HIGH (ref 44–121)
BUN/Creatinine Ratio: 27 — ABNORMAL HIGH (ref 9–23)
BUN: 19 mg/dL (ref 6–24)
Bilirubin Total: 0.2 mg/dL (ref 0.0–1.2)
CO2: 24 mmol/L (ref 20–29)
Calcium: 10.1 mg/dL (ref 8.7–10.2)
Chloride: 102 mmol/L (ref 96–106)
Creatinine, Ser: 0.71 mg/dL (ref 0.57–1.00)
Globulin, Total: 3.1 g/dL (ref 1.5–4.5)
Glucose: 92 mg/dL (ref 70–99)
Potassium: 4.5 mmol/L (ref 3.5–5.2)
Sodium: 139 mmol/L (ref 134–144)
Total Protein: 7.3 g/dL (ref 6.0–8.5)
eGFR: 104 mL/min/{1.73_m2} (ref >59)

## 2023-02-02 LAB — THYROID PANEL WITH TSH
Free Thyroxine Index: 3.4 (ref 1.2–4.9)
T3 Uptake Ratio: 28 % (ref 24–39)
T4, Total: 12 ug/dL (ref 4.5–12.0)
TSH: 0.256 u[IU]/mL — ABNORMAL LOW (ref 0.450–4.500)

## 2023-02-03 ENCOUNTER — Other Ambulatory Visit: Payer: Self-pay

## 2023-02-03 ENCOUNTER — Encounter: Payer: Self-pay | Admitting: Nurse Practitioner

## 2023-02-03 MED ORDER — LEVOTHYROXINE SODIUM 88 MCG PO TABS
88.0000 ug | ORAL_TABLET | Freq: Every day | ORAL | 1 refills | Status: DC
  Filled 2023-02-03: qty 30, 30d supply, fill #0
  Filled 2023-02-28: qty 30, 30d supply, fill #1

## 2023-02-04 ENCOUNTER — Other Ambulatory Visit: Payer: Self-pay

## 2023-02-06 ENCOUNTER — Other Ambulatory Visit: Payer: Self-pay

## 2023-03-01 ENCOUNTER — Other Ambulatory Visit: Payer: Self-pay

## 2023-03-11 ENCOUNTER — Other Ambulatory Visit: Payer: Self-pay

## 2023-03-12 ENCOUNTER — Other Ambulatory Visit: Payer: Self-pay

## 2023-03-12 NOTE — Patient Instructions (Signed)
  Medicaid Managed Care   Unsuccessful Outreach Note  03/12/2023 Name: Kaitlyn Wise MRN: [id number] DOB: [birth year]  Referred by: Hoy Register, MD Reason for referral : High Risk Managed Medicaid (MM social work unsuccessful telephone outreach )   An unsuccessful telephone outreach was attempted today. The patient was referred to the case management team for assistance with care management and care coordination.   Follow Up Plan: A HIPAA compliant phone message was left for the patient providing contact information and requesting a return call.   Abelino Derrick, MHA [facility]  Managed [facility] Social Worker [phone]

## 2023-03-12 NOTE — Patient Outreach (Signed)
  Medicaid Managed Care   Unsuccessful Outreach Note  03/12/2023 Name: Kaitlyn Wise MRN: [id number] DOB: [birth year]  Referred by: Hoy Register, MD Reason for referral : High Risk Managed Medicaid (MM social work unsuccessful telephone outreach )   An unsuccessful telephone outreach was attempted today. The patient was referred to the case management team for assistance with care management and care coordination.   Follow Up Plan: A HIPAA compliant phone message was left for the patient providing contact information and requesting a return call.   Abelino Derrick, MHA [facility]  Managed [facility] Social Worker [phone]

## 2023-03-14 ENCOUNTER — Telehealth: Payer: Self-pay

## 2023-03-14 NOTE — Progress Notes (Signed)
 Complex Care Management Note  Care Guide Note 03/14/2023 Name: Kaitlyn Wise MRN: [id number] DOB: 24-May-1970  Kaitlyn Wise is a 53 y.o. year old female who sees Kaitlyn Register, MD for primary care. I reached out to Elenore Unknown Foley by phone today to offer complex care management services.  [name] was given information about Complex Care Management services today including:   The Complex Care Management services include support from the care team which includes your Nurse Care Manager, Clinical Social Worker, or Pharmacist.  The Complex Care Management team is here to help remove barriers to the health concerns and goals most important to you. Complex Care Management services are voluntary, and the patient may decline or stop services at any time by request to their care team member.   Complex Care Management Consent Status: Patient agreed to services and verbal consent obtained.   Follow up plan:  Telephone appointment with complex care management team member scheduled for:  03/26/23 at 3:30 pm.   Encounter Outcome:  Patient Scheduled  [facility]  [facility], [facility] Care Management Assistant Direct Dial: [phone]  Fax: [phone]

## 2023-03-26 ENCOUNTER — Other Ambulatory Visit: Payer: Self-pay

## 2023-03-26 NOTE — Patient Instructions (Signed)
 Visit Information  Kaitlyn Wise was given information about Medicaid Managed Care team care coordination services as a part of their [employer] Medicaid benefit. Kaitlyn Wise verbally consentedto engagement with the [facility] Managed Care team.   If you are experiencing a medical emergency, please call 911 or report to your local emergency department or urgent care.   If you have a non-emergency medical problem during routine business hours, please contact your provider's office and ask to speak with a nurse.   For questions related to your [facility] health plan, please call: [phone]  OR visit the member homepage at: [email or web address]  If you would like to schedule transportation through your [facility] plan, please call the following number at least 2 days in advance of your appointment: [phone]  If you are experiencing a behavioral health crisis, call the [facility] Crisis Line at [phone]). The line is available 24 hours a day, seven days a week.  If you would like help to quit smoking, call 1-800-QUIT-NOW (5613731621) OR Espaol: 1-855-Djelo-Ya (2-595-638-7564) o para ms informacin haga clic aqu or Text READY to 332-951 to register via text   Kaitlyn Wise - following are the goals we discussed in your visit today:   Goals Addressed   None      Social Worker will follow up in 30 days.   Gus Puma, Kenard Gower, MHA [facility]  Managed Medicaid Social Worker [phone]   Following is a copy of your plan of care:  There are no care plans that you recently modified to display for this patient.

## 2023-03-26 NOTE — Patient Outreach (Signed)
 Medicaid Managed Care Social Work Note  03/26/2023 Name:  Kaitlyn Wise MRN:  [id number] DOB:  04-Apr-1970  Kaitlyn Wise is an 53 y.o. year old female who is a primary patient of Hoy Register, MD.  The Medicaid Managed Care Coordination team was consulted for assistance with:  Community Resources   Kaitlyn Wise was given information about Medicaid Managed Care Coordination team services today. Kaitlyn Wise Patient agreed to services and verbal consent obtained.  Engaged with patient  for by telephone forinitial visit in response to referral for case management and/or care coordination services.   Patient is participating in a Managed Medicaid Plan:  Yes  Assessments/Interventions:  Review of past medical history, allergies, medications, health status, including review of consultants reports, laboratory and other test data, was performed as part of comprehensive evaluation and provision of chronic care management services.  SDOH: (Social Drivers of Health) assessments and interventions performed: SDOH Interventions    Flowsheet Row Office Visit from 02/01/2023 in [facility] Of [place]. [facility] from [facility] Office Visit from 05/06/2019 in [facility] Dept Of [place]. [facility]  SDOH Interventions     Food Insecurity Interventions AMB Referral -- --  Housing Interventions AMB Referral -- --  Transportation Interventions Intervention Not Indicated -- --  Utilities Interventions Intervention Not Indicated -- --  Alcohol Usage Interventions Intervention Not Indicated (Score <7) -- --  Depression Interventions/Treatment  -- Medication, Counseling Medication  Physical Activity Interventions Patient Declined -- --  Social Connections Interventions Intervention Not Indicated  -- --  Health Literacy Interventions AMB Referral -- --      BSW completed a telephone outreach with patient using an interpreter, patient states she is working part time 3 days a week. She states she lives alone and is responsible for all bills. She does not receive and has not applied for foodstamps. [name] states she would like resources for food, rent, utilities and food. BSW and patient agreed for resources to be mailed.  Advanced Directives Status:  Not addressed in this encounter.  Care Plan                 Allergies  Allergen Reactions   Egg-Derived Products    Fish Allergy Itching   Milk-Related Compounds Diarrhea and Itching    Medications Reviewed Today   Medications were not reviewed in this encounter     Patient Active Problem List   Diagnosis Date Noted   Abnormal uterine bleeding (AUB) 11/25/2019   Chest pain in adult 08/01/2017   Costochondritis 08/01/2017   Current severe episode of major depressive disorder without psychotic features without prior episode (HCC) 08/01/2017   Mild episode of recurrent major depressive disorder (HCC) 01/18/2017   Migraine 12/05/2015   Gastritis and gastroduodenitis 03/09/2015   Dyslipidemia 06/16/2013   Hypothyroidism 05/21/2013    Conditions to be addressed/monitored per PCP order:   community resources  There are no care plans that you recently modified to display for this patient.   Follow up:  Patient agrees to Care Plan and Follow-up.  Plan: The Managed Medicaid care management team will reach out to the patient again over the next 30 days.  Date/time of next scheduled Social Work care management/care coordination outreach:  04/26/23  Gus Puma, BSW, MHA [place]  Managed Medicaid Social  Worker [phone]

## 2023-04-05 ENCOUNTER — Other Ambulatory Visit: Payer: Self-pay | Admitting: Nurse Practitioner

## 2023-04-05 ENCOUNTER — Other Ambulatory Visit: Payer: Self-pay

## 2023-04-05 DIAGNOSIS — E038 Other specified hypothyroidism: Secondary | ICD-10-CM

## 2023-04-05 MED ORDER — LEVOTHYROXINE SODIUM 88 MCG PO TABS
88.0000 ug | ORAL_TABLET | Freq: Every day | ORAL | 1 refills | Status: DC
  Filled 2023-04-05: qty 30, 30d supply, fill #0
  Filled 2023-05-06: qty 30, 30d supply, fill #1

## 2023-04-10 ENCOUNTER — Other Ambulatory Visit: Payer: Self-pay

## 2023-04-26 ENCOUNTER — Other Ambulatory Visit: Payer: Self-pay

## 2023-04-26 NOTE — Patient Outreach (Signed)
 Complex Care Management   Visit Note  04/26/2023  Name:  Kaitlyn Wise MRN: [id number] DOB: [birth year]  Situation: Referral received for Complex Care Management related to SDOH Barriers:  Housing assistance with rent Food insecurity Lack of essential utilities assitance with paying utility bill.  I obtained verbal consent from Patient.  Visit completed with Patient  on the phone  Background:   Past Medical History:  Diagnosis Date   Allergy    Anemia    Anxiety    Arthritis    Depression    Dyslipidemia    GERD (gastroesophageal reflux disease)    Thyroid disease    Uterine fibroid    Vertigo     Assessment: Patient Reported Symptoms:  Cognitive    Neurological      HEENT      Cardiovascular      Respiratory      Endocrine      Gastrointestinal      Genitourinary      Integumentary      Musculoskeletal      Psychosocial       There were no vitals filed for this visit.  Medications Reviewed Today   Medications were not reviewed in this encounter     Recommendation:   Patient will continue to work and use resources BSW sent.  Follow Up Plan:   Patient has met all care management goals. Care Management case will be closed. Patient has been provided contact information should new needs arise.   Gus Puma, Kenard Gower, MHA [place]  [facility] Social Worker, Population Health [phone]

## 2023-04-26 NOTE — Patient Instructions (Signed)
 Visit Information  Ms. Hoeschen was given information about Medicaid Managed Care team care coordination services as a part of their [employer] Medicaid benefit. Seniya Unknown Foley verbally consentedto engagement with the [facility] Managed Care team.   If you are experiencing a medical emergency, please call 911 or report to your local emergency department or urgent care.   If you have a non-emergency medical problem during routine business hours, please contact your provider's office and ask to speak with a nurse.   For questions related to your [facility] [facility] health plan, please call: [phone]  OR visit the member homepage at: [email or web address]  If you would like to schedule transportation through your [facility] plan, please call the following number at least 2 days in advance of your appointment: [phone]  If you are experiencing a behavioral health crisis, call the [facility] Crisis Line at [phone]). The line is available 24 hours a day, seven days a week.  If you would like help to quit smoking, call 1-800-QUIT-NOW (423-039-5725) OR Espaol: 1-855-Djelo-Ya (3-235-573-2202) o para ms informacin haga clic aqu or Text READY to 542-706 to register via text   Ms. Mealy - following are the goals we discussed in your visit today:   Goals Addressed             This Visit's Progress    COMPLETED: BSW VBCI Social Work Care Plan   On track    Problems:   Food Insecurity  and Housing   CSW Clinical Goal(s):      the Patient will  Patient will continue to use resources BSW mailed. .  Interventions:  Social Determinants of Health in Patient with : SDOH assessments completed: Financial Strain , Food Insecurity , and Housing  Evaluation of current treatment plan related to unmet needs Patient has received  resources BSW  . Patient is now receiving foodstamps.  Patient Goals/Self-Care Activities:  Continue to use community resources.  Plan:   The patient has been provided with contact information for the care management team and has been advised to call with any health related questions or concerns.          The  Patient                                              has been provided with contact information for the Managed Medicaid care management team and has been advised to call with any health related questions or concerns.   Gus Puma, Kenard Gower, MHA [place]  [facility] Social Worker, Population Health [phone]   Following is a copy of your plan of care:  There are no care plans that you recently modified to display for this patient.

## 2023-05-06 ENCOUNTER — Ambulatory Visit: Payer: No Typology Code available for payment source | Attending: Family Medicine | Admitting: Family Medicine

## 2023-05-06 ENCOUNTER — Other Ambulatory Visit: Payer: Self-pay

## 2023-05-06 ENCOUNTER — Encounter: Payer: Self-pay | Admitting: Family Medicine

## 2023-05-06 VITALS — BP 112/76 | HR 65 | Temp 97.7°F | Resp 16 | Wt 164.0 lb

## 2023-05-06 DIAGNOSIS — M5432 Sciatica, left side: Secondary | ICD-10-CM | POA: Diagnosis not present

## 2023-05-06 DIAGNOSIS — R1013 Epigastric pain: Secondary | ICD-10-CM

## 2023-05-06 DIAGNOSIS — E038 Other specified hypothyroidism: Secondary | ICD-10-CM | POA: Diagnosis not present

## 2023-05-06 DIAGNOSIS — K219 Gastro-esophageal reflux disease without esophagitis: Secondary | ICD-10-CM | POA: Diagnosis not present

## 2023-05-06 MED ORDER — CYCLOBENZAPRINE HCL 10 MG PO TABS
10.0000 mg | ORAL_TABLET | Freq: Every evening | ORAL | 0 refills | Status: DC | PRN
  Filled 2023-05-06: qty 30, 30d supply, fill #0

## 2023-05-06 MED ORDER — PANTOPRAZOLE SODIUM 40 MG PO TBEC
40.0000 mg | DELAYED_RELEASE_TABLET | Freq: Every day | ORAL | 1 refills | Status: AC
  Filled 2023-05-06: qty 30, 30d supply, fill #0

## 2023-05-06 NOTE — Progress Notes (Signed)
 Kaitlyn Wise [id number]  Thyroid   Sciatica Refill medication

## 2023-05-06 NOTE — Progress Notes (Signed)
 Subjective:  Patient ID: Kaitlyn Wise, female    DOB: 12/05/1970  Age: 53 y.o. MRN: [id number]  CC: Hypothyroidism and Gastroesophageal Reflux     Discussed the use of AI scribe software for clinical note transcription with the patient, who gave verbal consent to proceed.  History of Present Illness The patient, with a history of hypothyroidism, GERD presents with left-sided sciatica that has been ongoing since last May. She reports that the pain is severe enough to limit her ability to stand for more than ten minutes at a time. She has been managing her pain without medication, as a previous prescription for gabapentin  did not provide relief. She also reports that she has been fasting on Mondays and Thursdays in an attempt to lose weight hoping it will be beneficial for her sciatica.  She denies currently taking paroxetine  even though her med list reveals it was last refilled in 02/2023, which was previously prescribed for hot flashes.  Endorses adherence with levothyroxine .    Past Medical History:  Diagnosis Date   Allergy    Anemia    Anxiety    Arthritis    Depression    Dyslipidemia    GERD (gastroesophageal reflux disease)    Thyroid  disease    Uterine fibroid    Vertigo     Past Surgical History:  Procedure Laterality Date   CHOLECYSTECTOMY      Family History  Problem Relation Age of Onset   Diabetes Mother    Hypertension Mother    Diabetes Father    Hypertension Father    Heart disease Brother    Colon cancer Neg Hx    Esophageal cancer Neg Hx    Stomach cancer Neg Hx     Social History   Socioeconomic History   Marital status: Widowed    Spouse name: Not on file   Number of children: Not on file   Years of education: Not on file   Highest education level: Not on file  Occupational History   Not on file  Tobacco Use   Smoking status: Never   Smokeless tobacco: Never  Vaping Use   Vaping status: Never Used  Substance and Sexual  Activity   Alcohol use: No   Drug use: No   Sexual activity: Not Currently  Other Topics Concern   Not on file  Social History Narrative   Not on file   Social Drivers of Health   Financial Resource Strain: Not on file  Food Insecurity: Food Insecurity Present (02/01/2023)   Hunger Vital Sign    Worried About Running Out of Food in the Last Year: Often true    Ran Out of Food in the Last Year: Sometimes true  Transportation Needs: No Transportation Needs (02/01/2023)   PRAPARE - Administrator, Civil Service (Medical): No    Lack of Transportation (Non-Medical): No  Physical Activity: Inactive (02/01/2023)   Exercise Vital Sign    Days of Exercise per Week: 0 days    Minutes of Exercise per Session: 0 min  Stress: Not on file  Social Connections: Socially Isolated (02/01/2023)   Social Connection and Isolation Panel [NHANES]    Frequency of Communication with Friends and Family: Three times a week    Frequency of Social Gatherings with Friends and Family: Twice a week    Attends Religious Services: Never    Database administrator or Organizations: No    Attends Banker Meetings: Never  Marital Status: Widowed    Allergies  Allergen Reactions   Egg-Derived Products    Fish Allergy Itching   Milk-Related Compounds Diarrhea and Itching    Outpatient Medications Prior to Visit  Medication Sig Dispense Refill   gabapentin  (NEURONTIN ) 300 MG capsule Take 1 capsule (300 mg total) by mouth 2 (two) times daily. For back pain 60 capsule 3   levothyroxine  (SYNTHROID ) 88 MCG tablet Take 1 tablet (88 mcg total) by mouth daily. DOSE CHANGE 30 tablet 1   NEOMYCIN -POLYMYXIN-HYDROCORTISONE  (CORTISPORIN ) 1 % SOLN OTIC solution Place 3 drops into the left ear 4 (four) times daily. 10 mL 0   PARoxetine  (PAXIL ) 10 MG tablet Take 1 tablet (10 mg total) by mouth daily. For hot flashes 90 tablet 1   pantoprazole  (PROTONIX ) 40 MG tablet Take 1 tablet (40 mg total) by  mouth daily. 30 tablet 3   No facility-administered medications prior to visit.     ROS Review of Systems  Constitutional:  Negative for activity change and appetite change.  HENT:  Negative for sinus pressure and sore throat.   Respiratory:  Negative for chest tightness, shortness of breath and wheezing.   Cardiovascular:  Negative for chest pain and palpitations.  Gastrointestinal:  Negative for abdominal distention, abdominal pain and constipation.  Genitourinary: Negative.   Musculoskeletal:        See HPI  Psychiatric/Behavioral:  Negative for behavioral problems and dysphoric mood.     Objective:  BP 112/76 (BP Location: Left Arm, Patient Position: Sitting, Cuff Size: Normal)   Pulse 65   Temp 97.7 F (36.5 C) (Oral)   Resp 16   Wt 164 lb (74.4 kg)   LMP 03/15/2021 (Approximate)   SpO2 99%   BMI 30.00 kg/m      05/06/2023    3:42 PM 02/01/2023    3:08 PM 10/24/2022    5:42 PM  BP/Weight  Systolic BP 112 93 125  Diastolic BP 76 60 80  Wt. (Lbs) 164 179   BMI 30 kg/m2 32.74 kg/m2       Physical Exam Constitutional:      Appearance: She is well-developed.  Cardiovascular:     Rate and Rhythm: Normal rate.     Heart sounds: Normal heart sounds. No murmur heard. Pulmonary:     Effort: Pulmonary effort is normal.     Breath sounds: Normal breath sounds. No wheezing or rales.  Chest:     Chest wall: No tenderness.  Abdominal:     General: Bowel sounds are normal. There is no distension.     Palpations: Abdomen is soft. There is no mass.     Tenderness: There is no abdominal tenderness.  Musculoskeletal:     Right lower leg: No edema.     Left lower leg: No edema.     Comments: Positive straight leg raise on the left, negative on the right  Neurological:     Mental Status: She is alert and oriented to person, place, and time.  Psychiatric:        Mood and Affect: Mood normal.        Latest Ref Rng & Units 02/01/2023    3:55 PM 03/28/2022    3:20 PM  11/29/2021   12:46 AM  CMP  Glucose 70 - 99 mg/dL 92  90  161   BUN 6 - 24 mg/dL 19  15  20    Creatinine 0.57 - 1.00 mg/dL 0.96  0.45  4.09   Sodium 134 -  144 mmol/L 139  141  140   Potassium 3.5 - 5.2 mmol/L 4.5  4.8  4.1   Chloride 96 - 106 mmol/L 102  104  104   CO2 20 - 29 mmol/L 24  25  20    Calcium  8.7 - 10.2 mg/dL 40.9  81.1  9.9   Total Protein 6.0 - 8.5 g/dL 7.3  7.4  8.1   Total Bilirubin 0.0 - 1.2 mg/dL 0.2  0.3  0.2   Alkaline Phos 44 - 121 IU/L 164  144  122   AST 0 - 40 IU/L 22  24  26    ALT 0 - 32 IU/L 22  21  19      Lipid Panel     Component Value Date/Time   CHOL 203 (H) 03/28/2022 1520   TRIG 107 03/28/2022 1520   HDL 57 03/28/2022 1520   CHOLHDL 3.8 09/12/2020 0945   CHOLHDL 2.9 08/25/2015 1111   VLDL 17 08/25/2015 1111   LDLCALC 127 (H) 03/28/2022 1520    CBC    Component Value Date/Time   WBC 5.6 03/28/2022 1520   WBC 8.4 11/29/2021 0046   RBC 5.02 03/28/2022 1520   RBC 5.04 11/29/2021 0046   HGB 13.7 03/28/2022 1520   HCT 42.4 03/28/2022 1520   PLT 307 03/28/2022 1520   MCV 85 03/28/2022 1520   MCH 27.3 03/28/2022 1520   MCH 26.8 11/29/2021 0046   MCHC 32.3 03/28/2022 1520   MCHC 31.1 11/29/2021 0046   RDW 13.8 03/28/2022 1520   LYMPHSABS 2.3 03/28/2022 1520   MONOABS 0.4 11/29/2021 0046   EOSABS 0.4 03/28/2022 1520   BASOSABS 0.1 03/28/2022 1520    Lab Results  Component Value Date   HGBA1C 5.8 (H) 03/28/2022    Lab Results  Component Value Date   TSH 0.256 (L) 02/01/2023       Assessment & Plan Sciatica Chronic left-sided sciatica with radiating pain and difficulty standing. Gabapentin  was ineffective. - Refer to physical therapy for management. - Prescribe Flexeril  10 mg at bedtime. - Provide home exercises for relief.  Gastro-esophageal reflux disease GERD well-controlled with pantoprazole . - Refill pantoprazole .  Other specified hypothyroidism Managed with levothyroxine . Abnormal thyroid  function tests in 01/2023  require further evaluation. - Order thyroid  function blood test.  Vasomotor symptoms due to menopause Previously on paroxetine  for hot flashes, currently not taking it.  General Health Maintenance Due for mammogram, reluctant due to discomfort. Emphasized cancer screening importance. - Encourage scheduling of mammogram.      Meds ordered this encounter  Medications   cyclobenzaprine  (FLEXERIL ) 10 MG tablet    Sig: Take 1 tablet (10 mg total) by mouth at bedtime as needed for muscle spasms.    Dispense:  90 tablet    Refill:  0   pantoprazole  (PROTONIX ) 40 MG tablet    Sig: Take 1 tablet (40 mg total) by mouth daily.    Dispense:  90 tablet    Refill:  1    Follow-up: Return in about 6 months (around 11/05/2023) for Chronic medical conditions.       Joaquin Mulberry, MD, FAAFP. [facility] [place], [place] [phone]   05/06/2023, 4:13 PM

## 2023-05-06 NOTE — Patient Instructions (Signed)

## 2023-05-07 ENCOUNTER — Encounter: Payer: Self-pay | Admitting: Family Medicine

## 2023-05-07 ENCOUNTER — Other Ambulatory Visit: Payer: Self-pay

## 2023-05-07 ENCOUNTER — Other Ambulatory Visit: Payer: Self-pay | Admitting: Family Medicine

## 2023-05-07 DIAGNOSIS — E038 Other specified hypothyroidism: Secondary | ICD-10-CM

## 2023-05-07 LAB — T3: T3, Total: 91 ng/dL (ref 71–180)

## 2023-05-07 LAB — TSH: TSH: 2.67 u[IU]/mL (ref 0.450–4.500)

## 2023-05-07 LAB — T4, FREE: Free T4: 0.93 ng/dL (ref 0.82–1.77)

## 2023-05-07 MED ORDER — LEVOTHYROXINE SODIUM 88 MCG PO TABS
88.0000 ug | ORAL_TABLET | Freq: Every day | ORAL | 1 refills | Status: DC
  Filled 2023-05-10: qty 30, 30d supply, fill #0
  Filled 2023-06-04: qty 30, 30d supply, fill #1
  Filled 2023-07-17: qty 30, 30d supply, fill #2
  Filled 2023-08-12 – 2023-08-21 (×2): qty 30, 30d supply, fill #3
  Filled 2023-09-17: qty 30, 30d supply, fill #4
  Filled 2023-10-20: qty 30, 30d supply, fill #5

## 2023-05-08 ENCOUNTER — Other Ambulatory Visit: Payer: Self-pay

## 2023-05-10 ENCOUNTER — Other Ambulatory Visit: Payer: Self-pay

## 2023-05-13 NOTE — Therapy (Unsigned)
 OUTPATIENT PHYSICAL THERAPY THORACOLUMBAR EVALUATION   Patient Name: Kaitlyn Wise MRN: [id number] DOB:Mar 22, 1970, 53 y.o., female Today's Date: 05/14/2023  END OF SESSION:  PT End of Session - 05/14/23 1127     Visit Number 1    Number of Visits 6    Date for PT Re-Evaluation 07/14/23    Authorization Type [employer]    PT Start Time 1130    PT Stop Time 1215    PT Time Calculation (min) 45 min    Activity Tolerance Patient tolerated treatment well;Patient limited by pain    Behavior During Therapy [facility] for tasks assessed/performed             Past Medical History:  Diagnosis Date   Allergy    Anemia    Anxiety    Arthritis    Depression    Dyslipidemia    GERD (gastroesophageal reflux disease)    Thyroid  disease    Uterine fibroid    Vertigo    Past Surgical History:  Procedure Laterality Date   CHOLECYSTECTOMY     Patient Active Problem List   Diagnosis Date Noted   Abnormal uterine bleeding (AUB) 11/25/2019   Chest pain in adult 08/01/2017   Costochondritis 08/01/2017   Current severe episode of major depressive disorder without psychotic features without prior episode (HCC) 08/01/2017   Mild episode of recurrent major depressive disorder (HCC) 01/18/2017   Migraine 12/05/2015   Gastritis and gastroduodenitis 03/09/2015   Dyslipidemia 06/16/2013   Hypothyroidism 05/21/2013    PCP: Joaquin Mulberry, MD  REFERRING PROVIDER: Joaquin Mulberry, MD  REFERRING DIAG: M54.32 (ICD-10-CM) - Left sided sciatica  Rationale for Evaluation and Treatment: Rehabilitation  THERAPY DIAG:  Other low back pain  Muscle weakness (generalized)  Sciatica, left side  ONSET DATE: May 2024  SUBJECTIVE:                                                                                                                                                                                           SUBJECTIVE STATEMENT: (Interpreter present for assessment).  Patient  relates a year long history of low back and L leg pain.  PERTINENT HISTORY:  The patient, with a history of hypothyroidism, GERD presents with left-sided sciatica that has been ongoing since last May. She reports that the pain is severe enough to limit her ability to stand for more than ten minutes at a time. She has been managing her pain without medication, as a previous prescription for gabapentin  did not provide relief. She also reports that she has been fasting on Mondays and Thursdays in an attempt to lose weight hoping  it will be beneficial for her sciatica.  She denies currently taking paroxetine  even though her med list reveals it was last refilled in 02/2023, which was previously prescribed for hot flashes.  Endorses adherence with levothyroxine .  PAIN:  Are you having pain? Yes: NPRS scale: 6/10 Pain location: low back and LLE Pain description: ache, throb Aggravating factors: prolonged walking/standing Relieving factors: sitting/lying down  PRECAUTIONS: None  RED FLAGS: None   WEIGHT BEARING RESTRICTIONS: No  FALLS:  Has patient fallen in last 6 months? No    OCCUPATION: production  PLOF: Independent  PATIENT GOALS: To manage my back symptoms  NEXT MD VISIT: TBD  OBJECTIVE:  Note: Objective measures were completed at Evaluation unless otherwise noted.  DIAGNOSTIC FINDINGS:  None available  PATIENT SURVEYS:  Patient-specific activity scoring scheme (Point to one number):  "0" represents "unable to perform." "10" represents "able to perform at prior level. 0 1 2 3 4 5 6 7 8 9  10 (Date and Score) Activity Initial  Activity Eval     Prolonged standing  3    Prolonged walking  3    lifting 3    Additional Additional Total score = sum of the activity scores/number of activities Minimum detectable change (90%CI) for average score = 2 points Minimum detectable change (90%CI) for single activity score = 3 points PSFS developed by: [place] Spitz., & Binkley, J. (1995). Assessing disability and change on individual  patients: a report of a patient specific measure. Physiotherapy [place], 47, [phone]. Reproduced with the permission of the authors  Score: 9/30 (30% functional)  MUSCLE LENGTH: Hamstrings: Right 70 deg; Left 45 deg  POSTURE: No Significant postural limitations  PALPATION: deferred  LUMBAR ROM:   AROM eval  Flexion 50%  Extension 10%  Right lateral flexion 50%  Left lateral flexion 50%  Right rotation   Left rotation    (Blank rows = not tested)  LOWER EXTREMITY ROM:   WFL for gait and transfers  Active  Right eval Left eval  Hip flexion    Hip extension    Hip abduction    Hip adduction    Hip internal rotation    Hip external rotation    Knee flexion    Knee extension    Ankle dorsiflexion    Ankle plantarflexion    Ankle inversion    Ankle eversion     (Blank rows = not tested)  LOWER EXTREMITY MMT:    MMT Right eval Left eval  Hip flexion    Hip extension    Hip abduction    Hip adduction    Hip internal rotation    Hip external rotation    Knee flexion    Knee extension    Ankle dorsiflexion    Ankle plantarflexion    Ankle inversion    Ankle eversion     (Blank rows = not tested)  LUMBAR SPECIAL TESTS:  Straight leg raise test: positive SI/piriformis pain and Slump test: positive L SI/piriformis pain  FUNCTIONAL TESTS:  30 seconds chair stand test 5 reps no UE support  GAIT: Distance walked: 15ftx2 Assistive device utilized: None Level of assistance: Complete Independence Comments: slightly antalgic  TREATMENT DATE:  [facility] Adult PT Treatment:  DATE: 05/14/23 Eval and HEP Self Care: Additional minutes spent for educating on updated Therapeutic Home Exercise Program as well as comparing current status to condition at start of symptoms. This included exercises focusing on stretching, strengthening, with focus on  eccentric aspects. Long term goals include an improvement in range of motion, strength, endurance as well as avoiding reinjury. Patient's frequency would include in 1-2 times a day, 3-5 times a week for a duration of 6-12 weeks. Proper technique shown and discussed handout in great detail. All questions were discussed and addressed.                                                                                                                                 PATIENT EDUCATION:  Education details: Discussed eval findings, rehab rationale and POC and patient is in agreement  Person educated: Patient Education method: Explanation Education comprehension: verbalized understanding and needs further education  HOME EXERCISE PROGRAM: Access Code: [email or web address] URL: [email or web address] Date: 05/14/2023 Prepared by: Gretta Leavens  Exercises - Supine March  - 2 x daily - 5 x weekly - 2 sets - 10 reps - Supine 90/90 Abdominal Bracing  - 2 x daily - 5 x weekly - 1 sets - 2 reps - 30s hold - Sit to Stand with Arms Crossed  - 1 x daily - 5 x weekly - 1 sets - 1 reps  ASSESSMENT:  CLINICAL IMPRESSION: Patient is a 53 y.o. female who was seen today for physical therapy evaluation and treatment for chronic low back pain. Patient presents with positive SLR and slump signs for sciatic irritation.  Weakness evident in 30s chair stand tests as well as with 90/90.  Piriformis irritation and mobility restrictions identified.    OBJECTIVE IMPAIRMENTS: Abnormal gait, decreased activity tolerance, decreased endurance, decreased knowledge of condition, decreased mobility, difficulty walking, decreased ROM, increased fascial restrictions, impaired perceived functional ability, improper body mechanics, obesity, and pain.   ACTIVITY LIMITATIONS: carrying, lifting, bending, sitting, standing, and stairs  PERSONAL FACTORS: Age, Fitness, Time since onset of injury/illness/exacerbation, and language  barrier  are also affecting patient's functional outcome.   REHAB POTENTIAL: Fair based on chronicity and language barrier  CLINICAL DECISION MAKING: Evolving/moderate complexity  EVALUATION COMPLEXITY: Moderate   GOALS: Goals reviewed with patient? No    SHORT TERM GOALS=LONG TERM GOALS: Target date: 07/09/2023    Patient will acknowledge 4/10 pain at least once during episode of care   Baseline: 6/10 Goal status: INITIAL  2.  Patient will score at least 15/30 on PSFS to signify clinically meaningful improvement in functional abilities.   Baseline: 9/30 Goal status: INITIAL  3.  Patient will increase 30s chair stand reps from 5 to 8 without arms to demonstrate and improved functional ability with less pain/difficulty as well as reduce fall risk.  Baseline: 5 Goal status: INITIAL  4.  Patient to demonstrate independence in HEP  Baseline: [email or web address] Goal status: INITIAL  PLAN:  PT FREQUENCY: 1-2x/week  PT DURATION: 6 weeks  PLANNED INTERVENTIONS: 97164- PT Re-evaluation, 97110-Therapeutic exercises, 97530- Therapeutic activity, [id number]- Neuromuscular re-education, 97535- Self Care, 16109- Manual therapy, and 97116- Gait training.  PLAN FOR NEXT SESSION: HEP review and update, manual techniques as appropriate, aerobic tasks, ROM and flexibility activities, strengthening and PREs, TPDN, gait and balance training as needed    For all possible CPT codes, reference the Planned Interventions line above.     Check all conditions that are expected to impact treatment: {Conditions expected to impact treatment:Morbid obesity, Contractures, spasticity or fracture relevant to requested treatment, and Social determinants of health   If treatment provided at initial evaluation, no treatment charged due to lack of authorization.       Aissatou Fronczak M Asjia Berrios, PT 05/14/2023, 12:07 PM

## 2023-05-14 ENCOUNTER — Ambulatory Visit: Attending: Family Medicine

## 2023-05-14 ENCOUNTER — Other Ambulatory Visit: Payer: Self-pay

## 2023-05-14 DIAGNOSIS — M6281 Muscle weakness (generalized): Secondary | ICD-10-CM | POA: Insufficient documentation

## 2023-05-14 DIAGNOSIS — M5432 Sciatica, left side: Secondary | ICD-10-CM | POA: Diagnosis present

## 2023-05-14 DIAGNOSIS — M5459 Other low back pain: Secondary | ICD-10-CM | POA: Diagnosis present

## 2023-05-14 DIAGNOSIS — M5431 Sciatica, right side: Secondary | ICD-10-CM | POA: Insufficient documentation

## 2023-05-22 ENCOUNTER — Ambulatory Visit: Admitting: Physical Therapy

## 2023-05-28 ENCOUNTER — Encounter: Payer: Self-pay | Admitting: Physical Therapy

## 2023-05-28 ENCOUNTER — Ambulatory Visit: Attending: Family Medicine | Admitting: Physical Therapy

## 2023-05-28 DIAGNOSIS — M6281 Muscle weakness (generalized): Secondary | ICD-10-CM | POA: Diagnosis present

## 2023-05-28 DIAGNOSIS — M5459 Other low back pain: Secondary | ICD-10-CM

## 2023-05-28 DIAGNOSIS — M5432 Sciatica, left side: Secondary | ICD-10-CM

## 2023-05-28 NOTE — Therapy (Signed)
 OUTPATIENT PHYSICAL THERAPY TREATMENT NOTE   Patient Name: Kaitlyn Wise MRN: [id number] DOB:July 01, 1970, 53 y.o., female Today's Date: 05/28/2023  END OF SESSION:  PT End of Session - 05/28/23 1151     Visit Number 2    Number of Visits 6    Date for PT Re-Evaluation 07/14/23    Authorization Type [employer]    PT Start Time 1155    PT Stop Time 1235    PT Time Calculation (min) 40 min    Activity Tolerance Patient tolerated treatment well;Patient limited by pain    Behavior During Therapy [facility] for tasks assessed/performed              Past Medical History:  Diagnosis Date   Allergy    Anemia    Anxiety    Arthritis    Depression    Dyslipidemia    GERD (gastroesophageal reflux disease)    Thyroid  disease    Uterine fibroid    Vertigo    Past Surgical History:  Procedure Laterality Date   CHOLECYSTECTOMY     Patient Active Problem List   Diagnosis Date Noted   Abnormal uterine bleeding (AUB) 11/25/2019   Chest pain in adult 08/01/2017   Costochondritis 08/01/2017   Current severe episode of major depressive disorder without psychotic features without prior episode (HCC) 08/01/2017   Mild episode of recurrent major depressive disorder (HCC) 01/18/2017   Migraine 12/05/2015   Gastritis and gastroduodenitis 03/09/2015   Dyslipidemia 06/16/2013   Hypothyroidism 05/21/2013    PCP: Joaquin Mulberry, MD  REFERRING PROVIDER: Joaquin Mulberry, MD  REFERRING DIAG: M54.32 (ICD-10-CM) - Left sided sciatica  Rationale for Evaluation and Treatment: Rehabilitation  THERAPY DIAG:  Other low back pain  Muscle weakness (generalized)  Sciatica, left side  ONSET DATE: May 2024  SUBJECTIVE:                                                                                                                                                                                           SUBJECTIVE STATEMENT:In person interpreter utilized:  Pt attended today's  session with reports of 5/10 pain. Pt stated that they have maintained good compliance with current HEP.     PERTINENT HISTORY:  The patient, with a history of hypothyroidism, GERD presents with left-sided sciatica that has been ongoing since last May. She reports that the pain is severe enough to limit her ability to stand for more than ten minutes at a time. She has been managing her pain without medication, as a previous prescription for gabapentin  did not provide relief. She also reports that she has been fasting on  Mondays and Thursdays in an attempt to lose weight hoping it will be beneficial for her sciatica.  She denies currently taking paroxetine  even though her med list reveals it was last refilled in 02/2023, which was previously prescribed for hot flashes.  Endorses adherence with levothyroxine .  PAIN:  Are you having pain? Yes: NPRS scale: 6/10 Pain location: low back and LLE Pain description: ache, throb Aggravating factors: prolonged walking/standing Relieving factors: sitting/lying down  PRECAUTIONS: None  RED FLAGS: None   WEIGHT BEARING RESTRICTIONS: No  FALLS:  Has patient fallen in last 6 months? No    OCCUPATION: production  PLOF: Independent  PATIENT GOALS: To manage my back symptoms  NEXT MD VISIT: TBD  OBJECTIVE:  Note: Objective measures were completed at Evaluation unless otherwise noted.  DIAGNOSTIC FINDINGS:  None available  PATIENT SURVEYS:  Patient-specific activity scoring scheme (Point to one number):  "0" represents "unable to perform." "10" represents "able to perform at prior level. 0 1 2 3 4 5 6 7 8 9  10 (Date and Score) Activity Initial  Activity Eval     Prolonged standing  3    Prolonged walking  3    lifting 3    Additional Additional Total score = sum of the activity scores/number of activities Minimum detectable change (90%CI) for average score = 2 points Minimum detectable change (90%CI) for single activity score = 3  points PSFS developed by: [place] Spitz., & Binkley, J. (1995). Assessing disability and change on individual  patients: a report of a patient specific measure. Physiotherapy [place], 47, [phone]. Reproduced with the permission of the authors  Score: 9/30 (30% functional)  MUSCLE LENGTH: Hamstrings: Right 70 deg; Left 45 deg  POSTURE: No Significant postural limitations  PALPATION: deferred  LUMBAR ROM:   AROM eval  Flexion 50%  Extension 10%  Right lateral flexion 50%  Left lateral flexion 50%  Right rotation   Left rotation    (Blank rows = not tested)  LOWER EXTREMITY ROM:   WFL for gait and transfers  Active  Right eval Left eval  Hip flexion    Hip extension    Hip abduction    Hip adduction    Hip internal rotation    Hip external rotation    Knee flexion    Knee extension    Ankle dorsiflexion    Ankle plantarflexion    Ankle inversion    Ankle eversion     (Blank rows = not tested)  LOWER EXTREMITY MMT:    MMT Right eval Left eval  Hip flexion    Hip extension    Hip abduction    Hip adduction    Hip internal rotation    Hip external rotation    Knee flexion    Knee extension    Ankle dorsiflexion    Ankle plantarflexion    Ankle inversion    Ankle eversion     (Blank rows = not tested)  LUMBAR SPECIAL TESTS:  Straight leg raise test: positive SI/piriformis pain and Slump test: positive L SI/piriformis pain  FUNCTIONAL TESTS:  30 seconds chair stand test 5 reps no UE support  GAIT: Distance walked: 55ftx2 Assistive device utilized: None Level of assistance: Complete Independence Comments: slightly antalgic  TREATMENT: [facility] Adult PT Treatment:  DATE: 05/28/2023  Therapeutic Exercise: PROM into L hip ER/IR with piriformis release in prone Piriformis cross body stretch 2x1' Figure 4 stretch 2x1' Therapeutic Activity: NuStep 5' for activity tolerance Supine  hip fall out 2x10 B, hold 2s, GTB Supine bridge 2x10, hold 4, GTB  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 This included exercises focusing on stretching, strengthening, with focus on eccentric aspects. Long term goals include an improvement in range of motion, strength, endurance as well as avoiding reinjury. Patient's frequency would include in 1-2 times a day, 3-5 times a week for a duration of 6-12 weeks. Proper technique shown and discussed handout in great detail. All questions were discussed and addressed.                                                                                                                                 PATIENT EDUCATION:  Education details: Discussed eval findings, rehab rationale and POC and patient is in agreement  Person educated: Patient Education method: Explanation Education comprehension: verbalized understanding and needs further education  HOME EXERCISE PROGRAM: Access Code: [email or web address] URL: [email or web address] Date: 05/14/2023 Prepared by: Gretta Leavens  Exercises - Supine March  - 2 x daily - 5 x weekly - 2 sets - 10 reps - Supine 90/90 Abdominal Bracing  - 2 x daily - 5 x weekly - 1 sets - 2 reps - 30s hold - Sit to Stand with Arms Crossed  - 1 x daily - 5 x weekly - 1 sets - 1 reps  ASSESSMENT:  CLINICAL IMPRESSION: Pt attended physical therapy session for continuation of treatment regarding low back pain. Today's treatment focused on improvement of piriformis motility with hip mobility, lumbo-pelvic stability/strength, and activity tolerance. Pt showed  good tolerance to administered treatment with no adverse effects by the end  of session. Skilled intervention was utilized via activity modification for pt tolerance with task completion, functional progression/regression promoting best outcomes inline with current rehab goals, as well as minimal verbal/tactile cuing alongside no physical assistance for safe and appropriate performance of today's activities. Continue with therapeutic focus on posterior chain strength/stability, lumbo pelvic motility/mobility, and activity tolerance to progress towards standing tolerance more than 27m.   Eval impression:Patient is a 53 y.o. female who was seen today for physical therapy evaluation and treatment for chronic low back pain. Patient presents with positive SLR and slump signs for sciatic irritation.  Weakness evident in 30s chair stand  tests as well as with 90/90.  Piriformis irritation and mobility restrictions identified.    OBJECTIVE IMPAIRMENTS: Abnormal gait, decreased activity tolerance, decreased endurance, decreased knowledge of condition, decreased mobility, difficulty walking, decreased ROM, increased fascial restrictions, impaired perceived functional ability, improper body mechanics, obesity, and pain.   ACTIVITY LIMITATIONS: carrying, lifting, bending, sitting, standing, and stairs  PERSONAL FACTORS: Age, Fitness, Time since onset of injury/illness/exacerbation, and language barrier are also affecting patient's functional outcome.   REHAB POTENTIAL: Fair based on chronicity and language barrier  CLINICAL DECISION MAKING: Evolving/moderate complexity  EVALUATION COMPLEXITY: Moderate   GOALS: Goals reviewed with patient? No    SHORT TERM GOALS=LONG TERM GOALS: Target date: 07/09/2023    Patient will acknowledge 4/10 pain at least once during episode of care   Baseline: 6/10 Goal status: INITIAL  2.  Patient will score at least 15/30 on PSFS to signify clinically meaningful improvement in functional abilities.   Baseline: 9/30 Goal status: INITIAL  3.   Patient will increase 30s chair stand reps from 5 to 8 without arms to demonstrate and improved functional ability with less pain/difficulty as well as reduce fall risk.  Baseline: 5 Goal status: INITIAL  4.  Patient to demonstrate independence in HEP  Baseline: [id number] Goal status: INITIAL    PLAN:  PT FREQUENCY: 1-2x/week  PT DURATION: 6 weeks  PLANNED INTERVENTIONS: 97164- PT Re-evaluation, 97110-Therapeutic exercises, 97530- Therapeutic activity, 97112- Neuromuscular re-education, 97535- Self Care, 82956- Manual therapy, and 97116- Gait training.  PLAN FOR NEXT SESSION: HEP review and update, manual techniques as appropriate, aerobic tasks, ROM and flexibility activities, strengthening and PREs, TPDN, gait and balance training as needed    For all possible CPT codes, reference the Planned Interventions line above.     Check all conditions that are expected to impact treatment: {Conditions expected to impact treatment:Morbid obesity, Contractures, spasticity or fracture relevant to requested treatment, and Social determinants of health   If treatment provided at initial evaluation, no treatment charged due to lack of authorization.       Albesa Huguenin, PT, DPT 05/28/2023, 12:36 PM

## 2023-05-29 ENCOUNTER — Other Ambulatory Visit: Payer: Self-pay | Admitting: Family Medicine

## 2023-05-29 DIAGNOSIS — Z1231 Encounter for screening mammogram for malignant neoplasm of breast: Secondary | ICD-10-CM

## 2023-06-04 ENCOUNTER — Encounter

## 2023-06-04 DIAGNOSIS — Z1231 Encounter for screening mammogram for malignant neoplasm of breast: Secondary | ICD-10-CM

## 2023-06-11 ENCOUNTER — Ambulatory Visit

## 2023-06-11 ENCOUNTER — Other Ambulatory Visit: Payer: Self-pay

## 2023-06-11 DIAGNOSIS — M5432 Sciatica, left side: Secondary | ICD-10-CM

## 2023-06-11 DIAGNOSIS — M6281 Muscle weakness (generalized): Secondary | ICD-10-CM

## 2023-06-11 DIAGNOSIS — M5459 Other low back pain: Secondary | ICD-10-CM

## 2023-06-11 NOTE — Therapy (Signed)
 OUTPATIENT PHYSICAL THERAPY TREATMENT NOTE   Patient Name: Kaitlyn Wise MRN: [id number] DOB:[birth year], 53 y.o., female Today's Date: 06/11/2023  END OF SESSION:  PT End of Session - 06/11/23 1136     Visit Number 3    Number of Visits 6    Date for PT Re-Evaluation 07/14/23    Authorization Type [employer]    PT Start Time 1130    PT Stop Time 1210    PT Time Calculation (min) 40 min    Activity Tolerance Patient tolerated treatment well;Patient limited by pain    Behavior During Therapy [facility] for tasks assessed/performed               Past Medical History:  Diagnosis Date   Allergy    Anemia    Anxiety    Arthritis    Depression    Dyslipidemia    GERD (gastroesophageal reflux disease)    Thyroid  disease    Uterine fibroid    Vertigo    Past Surgical History:  Procedure Laterality Date   CHOLECYSTECTOMY     Patient Active Problem List   Diagnosis Date Noted   Abnormal uterine bleeding (AUB) 11/25/2019   Chest pain in adult 08/01/2017   Costochondritis 08/01/2017   Current severe episode of major depressive disorder without psychotic features without prior episode (HCC) 08/01/2017   Mild episode of recurrent major depressive disorder (HCC) 01/18/2017   Migraine 12/05/2015   Gastritis and gastroduodenitis 03/09/2015   Dyslipidemia 06/16/2013   Hypothyroidism 05/21/2013    PCP: Joaquin Mulberry, MD  REFERRING PROVIDER: Joaquin Mulberry, MD  REFERRING DIAG: M54.32 (ICD-10-CM) - Left sided sciatica  Rationale for Evaluation and Treatment: Rehabilitation  THERAPY DIAG:  Other low back pain  Muscle weakness (generalized)  Sciatica, left side  ONSET DATE: May 2024  SUBJECTIVE:                                                                                                                                                                                           SUBJECTIVE STATEMENT:In person interpreter utilized:  Unchanging  symptoms.  Aggravated by prolonged standing and walking.  6/10 in intensity.    PERTINENT HISTORY:  The patient, with a history of hypothyroidism, GERD presents with left-sided sciatica that has been ongoing since last May. She reports that the pain is severe enough to limit her ability to stand for more than ten minutes at a time. She has been managing her pain without medication, as a previous prescription for gabapentin  did not provide relief. She also reports that she has been fasting on Mondays and Thursdays in an attempt to  lose weight hoping it will be beneficial for her sciatica.  She denies currently taking paroxetine  even though her med list reveals it was last refilled in 02/2023, which was previously prescribed for hot flashes.  Endorses adherence with levothyroxine .  PAIN:  Are you having pain? Yes: NPRS scale: 6/10 Pain location: low back and LLE Pain description: ache, throb Aggravating factors: prolonged walking/standing Relieving factors: sitting/lying down  PRECAUTIONS: None  RED FLAGS: None   WEIGHT BEARING RESTRICTIONS: No  FALLS:  Has patient fallen in last 6 months? No    OCCUPATION: production  PLOF: Independent  PATIENT GOALS: To manage my back symptoms  NEXT MD VISIT: TBD  OBJECTIVE:  Note: Objective measures were completed at Evaluation unless otherwise noted.  DIAGNOSTIC FINDINGS:  None available  PATIENT SURVEYS:  Patient-specific activity scoring scheme (Point to one number):  "0" represents "unable to perform." "10" represents "able to perform at prior level. 0 1 2 3 4 5 6 7 8 9  10 (Date and Score) Activity Initial  Activity Eval     Prolonged standing  3    Prolonged walking  3    lifting 3    Additional Additional Total score = sum of the activity scores/number of activities Minimum detectable change (90%CI) for average score = 2 points Minimum detectable change (90%CI) for single activity score = 3 points PSFS developed by:  [place] Spitz., & Binkley, J. (1995). Assessing disability and change on individual  patients: a report of a patient specific measure. Physiotherapy [place], 47, [phone]. Reproduced with the permission of the authors  Score: 9/30 (30% functional)  MUSCLE LENGTH: Hamstrings: Right 70 deg; Left 45 deg  POSTURE: No Significant postural limitations  PALPATION: deferred  LUMBAR ROM:   AROM eval  Flexion 50%  Extension 10%  Right lateral flexion 50%  Left lateral flexion 50%  Right rotation   Left rotation    (Blank rows = not tested)  LOWER EXTREMITY ROM:   WFL for gait and transfers  Active  Right eval Left eval  Hip flexion    Hip extension    Hip abduction    Hip adduction    Hip internal rotation    Hip external rotation    Knee flexion    Knee extension    Ankle dorsiflexion    Ankle plantarflexion    Ankle inversion    Ankle eversion     (Blank rows = not tested)  LOWER EXTREMITY MMT:    MMT Right eval Left eval  Hip flexion    Hip extension    Hip abduction    Hip adduction    Hip internal rotation    Hip external rotation    Knee flexion    Knee extension    Ankle dorsiflexion    Ankle plantarflexion    Ankle inversion    Ankle eversion     (Blank rows = not tested)  LUMBAR SPECIAL TESTS:  Straight leg raise test: positive SI/piriformis pain and Slump test: positive L SI/piriformis pain  FUNCTIONAL TESTS:  30 seconds chair stand test 5 reps no UE support  GAIT: Distance walked: 48ftx2 Assistive device utilized: None Level of assistance: Complete Independence Comments: slightly antalgic  TREATMENT: [facility] Adult PT Treatment:  DATE: 06/11/23 Therapeutic Exercise: Nustep L2 8 min Seated hamstring stretch 30s x2 B QL stretch 30s x2 B Neuromuscular re-ed: Supine hip fallouts GTB 15x B, 15/15 unilaterally Supine bridge against GTB 15x S/L clams GTB 15/15 Therapeutic  Activity: STS 5x arms crossed  [facility] Adult PT Treatment:                                                DATE: 05/28/2023  Therapeutic Exercise: PROM into L hip ER/IR with piriformis release in prone Piriformis cross body stretch 2x1' Figure 4 stretch 2x1' Therapeutic Activity: NuStep 5' for activity tolerance Supine hip fall out 2x10 B, hold 2s, GTB Supine bridge 2x10, hold 4, GTB  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 This included exercises focusing on stretching, strengthening, with focus on eccentric aspects. Long term goals include an improvement in range of motion, strength, endurance as well as avoiding reinjury. Patient's frequency would include in 1-2 times a day, 3-5 times a week for a duration of 6-12 weeks. Proper technique shown and discussed handout in great detail. All questions were discussed and addressed.                                                                                                                                 PATIENT EDUCATION:  Education details: Discussed eval findings, rehab rationale and POC and patient is in agreement  Person educated: Patient Education method: Explanation Education comprehension: verbalized understanding and needs further education  HOME EXERCISE PROGRAM: Access Code: [email or web address] URL: [email or web address] Date: 05/14/2023 Prepared by: Gretta Leavens  Exercises - Supine March  - 2 x daily - 5 x weekly - 2 sets - 10 reps - Supine 90/90 Abdominal Bracing  - 2 x daily - 5 x weekly - 1 sets - 2 reps - 30s hold - Sit to Stand with Arms Crossed  - 1 x daily - 5 x weekly - 1 sets - 1 reps  ASSESSMENT:  CLINICAL  IMPRESSION: Focus of today was aerobic w/u followed by stretching tasks.  LLE symptoms persist, notable with L hamstring stretching.  Concentration on lumbosacral and hip strengthening with emphasis on high reps to build endurance.  Fatigue noted with tasks.  Eval impression:Patient is a 53 y.o. female who was seen today for physical therapy evaluation  and treatment for chronic low back pain. Patient presents with positive SLR and slump signs for sciatic irritation.  Weakness evident in 30s chair stand tests as well as with 90/90.  Piriformis irritation and mobility restrictions identified.    OBJECTIVE IMPAIRMENTS: Abnormal gait, decreased activity tolerance, decreased endurance, decreased knowledge of condition, decreased mobility, difficulty walking, decreased ROM, increased fascial restrictions, impaired perceived functional ability, improper body mechanics, obesity, and pain.   ACTIVITY LIMITATIONS: carrying, lifting, bending, sitting, standing, and stairs  PERSONAL FACTORS: Age, Fitness, Time since onset of injury/illness/exacerbation, and language barrier are also affecting patient's functional outcome.   REHAB POTENTIAL: Fair based on chronicity and language barrier  CLINICAL DECISION MAKING: Evolving/moderate complexity  EVALUATION COMPLEXITY: Moderate   GOALS: Goals reviewed with patient? No    SHORT TERM GOALS=LONG TERM GOALS: Target date: 07/09/2023    Patient will acknowledge 4/10 pain at least once during episode of care   Baseline: 6/10 Goal status: INITIAL  2.  Patient will score at least 15/30 on PSFS to signify clinically meaningful improvement in functional abilities.   Baseline: 9/30 Goal status: INITIAL  3.  Patient will increase 30s chair stand reps from 5 to 8 without arms to demonstrate and improved functional ability with less pain/difficulty as well as reduce fall risk.  Baseline: 5 Goal status: INITIAL  4.  Patient to demonstrate independence in HEP   Baseline: [email or web address] Goal status: INITIAL    PLAN:  PT FREQUENCY: 1-2x/week  PT DURATION: 6 weeks  PLANNED INTERVENTIONS: 97164- PT Re-evaluation, 97110-Therapeutic exercises, 97530- Therapeutic activity, 97112- Neuromuscular re-education, 97535- Self Care, 16109- Manual therapy, and 97116- Gait training.  PLAN FOR NEXT SESSION: HEP review and update, manual techniques as appropriate, aerobic tasks, ROM and flexibility activities, strengthening and PREs, TPDN, gait and balance training as needed    For all possible CPT codes, reference the Planned Interventions line above.     Check all conditions that are expected to impact treatment: {Conditions expected to impact treatment:Morbid obesity, Contractures, spasticity or fracture relevant to requested treatment, and Social determinants of health   If treatment provided at initial evaluation, no treatment charged due to lack of authorization.       Jeff Zarius Furr PT  06/11/2023, 12:14 PM

## 2023-06-17 NOTE — Therapy (Signed)
 OUTPATIENT PHYSICAL THERAPY TREATMENT NOTE   Patient Name: Kaitlyn Wise MRN: [id number] DOB:1970-10-14, 53 y.o., female Today's Date: 06/18/2023  END OF SESSION:  PT End of Session - 06/18/23 1530     Visit Number 4    Number of Visits 6    Date for PT Re-Evaluation 07/14/23    Authorization Type [employer]    Authorization Time Period Approved 12 visits 05/14/23-07/02/23    Authorization - Visit Number 4    PT Start Time 1530    PT Stop Time 1610    PT Time Calculation (min) 40 min    Activity Tolerance Patient tolerated treatment well;Patient limited by pain    Behavior During Therapy [facility] for tasks assessed/performed                Past Medical History:  Diagnosis Date   Allergy    Anemia    Anxiety    Arthritis    Depression    Dyslipidemia    GERD (gastroesophageal reflux disease)    Thyroid  disease    Uterine fibroid    Vertigo    Past Surgical History:  Procedure Laterality Date   CHOLECYSTECTOMY     Patient Active Problem List   Diagnosis Date Noted   Abnormal uterine bleeding (AUB) 11/25/2019   Chest pain in adult 08/01/2017   Costochondritis 08/01/2017   Current severe episode of major depressive disorder without psychotic features without prior episode (HCC) 08/01/2017   Mild episode of recurrent major depressive disorder (HCC) 01/18/2017   Migraine 12/05/2015   Gastritis and gastroduodenitis 03/09/2015   Dyslipidemia 06/16/2013   Hypothyroidism 05/21/2013    PCP: Joaquin Mulberry, MD  REFERRING PROVIDER: Joaquin Mulberry, MD  REFERRING DIAG: M54.32 (ICD-10-CM) - Left sided sciatica  Rationale for Evaluation and Treatment: Rehabilitation  THERAPY DIAG:  Other low back pain  Muscle weakness (generalized)  Sciatica, left side  ONSET DATE: May 2024  SUBJECTIVE:                                                                                                                                                                                            SUBJECTIVE STATEMENT:In person interpreter utilized:  Feels the therapy has helped as she can now stand for longer periods before back pain sets in.   PERTINENT HISTORY:  The patient, with a history of hypothyroidism, GERD presents with left-sided sciatica that has been ongoing since last May. She reports that the pain is severe enough to limit her ability to stand for more than ten minutes at a time. She has been managing her pain without medication, as a previous  prescription for gabapentin  did not provide relief. She also reports that she has been fasting on Mondays and Thursdays in an attempt to lose weight hoping it will be beneficial for her sciatica.  She denies currently taking paroxetine  even though her med list reveals it was last refilled in 02/2023, which was previously prescribed for hot flashes.  Endorses adherence with levothyroxine .  PAIN:  Are you having pain? Yes: NPRS scale: 6/10 Pain location: low back and LLE Pain description: ache, throb Aggravating factors: prolonged walking/standing Relieving factors: sitting/lying down  PRECAUTIONS: None  RED FLAGS: None   WEIGHT BEARING RESTRICTIONS: No  FALLS:  Has patient fallen in last 6 months? No    OCCUPATION: production  PLOF: Independent  PATIENT GOALS: To manage my back symptoms  NEXT MD VISIT: TBD  OBJECTIVE:  Note: Objective measures were completed at Evaluation unless otherwise noted.  DIAGNOSTIC FINDINGS:  None available  PATIENT SURVEYS:  Patient-specific activity scoring scheme (Point to one number):  "0" represents "unable to perform." "10" represents "able to perform at prior level. 0 1 2 3 4 5 6 7 8 9  10 (Date and Score) Activity Initial  Activity Eval     Prolonged standing  3    Prolonged walking  3    lifting 3    Additional Additional Total score = sum of the activity scores/number of activities Minimum detectable change (90%CI) for average score = 2  points Minimum detectable change (90%CI) for single activity score = 3 points PSFS developed by: [place] Spitz., & Binkley, J. (1995). Assessing disability and change on individual  patients: a report of a patient specific measure. Physiotherapy [place], 47, [phone]. Reproduced with the permission of the authors  Score: 9/30 (30% functional)  MUSCLE LENGTH: Hamstrings: Right 70 deg; Left 45 deg  POSTURE: No Significant postural limitations  PALPATION: deferred  LUMBAR ROM:   AROM eval  Flexion 50%  Extension 10%  Right lateral flexion 50%  Left lateral flexion 50%  Right rotation   Left rotation    (Blank rows = not tested)  LOWER EXTREMITY ROM:   WFL for gait and transfers  Active  Right eval Left eval  Hip flexion    Hip extension    Hip abduction    Hip adduction    Hip internal rotation    Hip external rotation    Knee flexion    Knee extension    Ankle dorsiflexion    Ankle plantarflexion    Ankle inversion    Ankle eversion     (Blank rows = not tested)  LOWER EXTREMITY MMT:    MMT Right eval Left eval  Hip flexion    Hip extension    Hip abduction    Hip adduction    Hip internal rotation    Hip external rotation    Knee flexion    Knee extension    Ankle dorsiflexion    Ankle plantarflexion    Ankle inversion    Ankle eversion     (Blank rows = not tested)  LUMBAR SPECIAL TESTS:  Straight leg raise test: positive SI/piriformis pain and Slump test: positive L SI/piriformis pain  FUNCTIONAL TESTS:  30 seconds chair stand test 5 reps no UE support  GAIT: Distance walked: 42ftx2 Assistive device utilized: None Level of assistance: Complete Independence Comments: slightly antalgic  TREATMENT: [facility] Adult PT Treatment:  DATE: 06/18/23 Therapeutic Exercise: Nustep L4 8 min Seated hamstring stretch 30s x2 B QL stretch 30s x2 B Neuromuscular re-ed: P-ball curl ups 10x B,  10/10 unilaterally Supine hip fallouts BluTB 10x B, 10/10 unilaterally Supine bridge against BluTB 10x S/L clams BluTB 10/10 Therapeutic Activity: STS 10x Sciatic tensioner L 10x  [facility] Adult PT Treatment:                                                DATE: 06/11/23 Therapeutic Exercise: Nustep L2 8 min Seated hamstring stretch 30s x2 B QL stretch 30s x2 B Neuromuscular re-ed: Supine hip fallouts GTB 15x B, 15/15 unilaterally Supine bridge against GTB 15x S/L clams GTB 15/15 Therapeutic Activity: STS 5x arms crossed  [facility] Adult PT Treatment:                                                DATE: 05/28/2023  Therapeutic Exercise: PROM into L hip ER/IR with piriformis release in prone Piriformis cross body stretch 2x1' Figure 4 stretch 2x1' Therapeutic Activity: NuStep 5' for activity tolerance Supine hip fall out 2x10 B, hold 2s, GTB Supine bridge 2x10, hold 4, GTB  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 This included exercises focusing on stretching, strengthening, with focus on eccentric aspects. Long term goals include an improvement in range of motion, strength, endurance as well as avoiding reinjury. Patient's frequency would include in 1-2 times a day, 3-5 times a week for a duration of 6-12 weeks. Proper technique shown and discussed handout in great detail. All questions were discussed and addressed.                                                                                                                                 PATIENT EDUCATION:  Education details: Discussed eval findings, rehab rationale and POC and patient is in agreement  Person educated: Patient Education  method: Explanation Education comprehension: verbalized understanding and needs further education  HOME EXERCISE PROGRAM: Access Code: [email or web address] URL: [email or web address] Date: 06/18/2023 Prepared by: Jaycen Vercher  Exercises - Supine March  - 2 x daily - 5 x weekly - 2 sets - 10 reps -  Supine 90/90 Abdominal Bracing  - 2 x daily - 5 x weekly - 1 sets - 2 reps - 30s hold - Sit to Stand with Arms Crossed  - 2 x daily - 5 x weekly - 1 sets - 1 reps - Supine Quadratus Lumborum Stretch  - 2 x daily - 5 x weekly - 1 sets - 2 reps - 30s hold - Curl Up with Reach  - 2 x daily - 5 x weekly - 1 sets - 10 reps  ASSESSMENT:  CLINICAL IMPRESSION: Patient reporting increased activity tolerance especially with standing tasks.  Increased resistance on aerobic work today and incorporated additional abdominal and core tasks.  Still has a positive sciatic tension sign and encouraged to continue nerve glides at home.  Eval impression:Patient is a 53 y.o. female who was seen today for physical therapy evaluation and treatment for chronic low back pain. Patient presents with positive SLR and slump signs for sciatic irritation.  Weakness evident in 30s chair stand tests as well as with 90/90.  Piriformis irritation and mobility restrictions identified.    OBJECTIVE IMPAIRMENTS: Abnormal gait, decreased activity tolerance, decreased endurance, decreased knowledge of condition, decreased mobility, difficulty walking, decreased ROM, increased fascial restrictions, impaired perceived functional ability, improper body mechanics, obesity, and pain.   ACTIVITY LIMITATIONS: carrying, lifting, bending, sitting, standing, and stairs  PERSONAL FACTORS: Age, Fitness, Time since onset of injury/illness/exacerbation, and language barrier are also affecting patient's functional outcome.   REHAB POTENTIAL: Fair based on chronicity and language barrier  CLINICAL DECISION MAKING: Evolving/moderate  complexity  EVALUATION COMPLEXITY: Moderate   GOALS: Goals reviewed with patient? No    SHORT TERM GOALS=LONG TERM GOALS: Target date: 07/09/2023    Patient will acknowledge 4/10 pain at least once during episode of care   Baseline: 6/10 Goal status: INITIAL  2.  Patient will score at least 15/30 on PSFS to signify clinically meaningful improvement in functional abilities.   Baseline: 9/30 Goal status: INITIAL  3.  Patient will increase 30s chair stand reps from 5 to 8 without arms to demonstrate and improved functional ability with less pain/difficulty as well as reduce fall risk.  Baseline: 5 Goal status: INITIAL  4.  Patient to demonstrate independence in HEP  Baseline: [id number] Goal status: INITIAL    PLAN:  PT FREQUENCY: 1-2x/week  PT DURATION: 6 weeks  PLANNED INTERVENTIONS: 97164- PT Re-evaluation, 97110-Therapeutic exercises, 97530- Therapeutic activity, 97112- Neuromuscular re-education, 97535- Self Care, 16109- Manual therapy, and 97116- Gait training.  PLAN FOR NEXT SESSION: HEP review and update, manual techniques as appropriate, aerobic tasks, ROM and flexibility activities, strengthening and PREs, TPDN, gait and balance training as needed    For all possible CPT codes, reference the Planned Interventions line above.     Check all conditions that are expected to impact treatment: {Conditions expected to impact treatment:Morbid obesity, Contractures, spasticity or fracture relevant to requested treatment, and Social determinants of health   If treatment provided at initial evaluation, no treatment charged due to lack of authorization.       Jeff Earnie Rockhold PT  06/18/2023, 4:14 PM

## 2023-06-18 ENCOUNTER — Ambulatory Visit: Attending: Family Medicine

## 2023-06-18 DIAGNOSIS — M5459 Other low back pain: Secondary | ICD-10-CM | POA: Diagnosis present

## 2023-06-18 DIAGNOSIS — M6281 Muscle weakness (generalized): Secondary | ICD-10-CM | POA: Insufficient documentation

## 2023-06-18 DIAGNOSIS — M5432 Sciatica, left side: Secondary | ICD-10-CM | POA: Insufficient documentation

## 2023-06-21 NOTE — Therapy (Signed)
 OUTPATIENT PHYSICAL THERAPY TREATMENT NOTE   Patient Name: Kaitlyn Wise MRN: [id number] DOB:March 22, 1970, 53 y.o., female Today's Date: 06/24/2023  END OF SESSION:  PT End of Session - 06/24/23 1303     Visit Number 5    Number of Visits 6    Date for PT Re-Evaluation 07/14/23    Authorization Type [employer]    Authorization Time Period Approved 12 visits 05/14/23-07/02/23    Authorization - Visit Number 5    Authorization - Number of Visits 12    PT Start Time 1315    PT Stop Time 1355    PT Time Calculation (min) 40 min    Activity Tolerance Patient tolerated treatment well;Patient limited by pain    Behavior During Therapy [facility] for tasks assessed/performed                 Past Medical History:  Diagnosis Date   Allergy    Anemia    Anxiety    Arthritis    Depression    Dyslipidemia    GERD (gastroesophageal reflux disease)    Thyroid  disease    Uterine fibroid    Vertigo    Past Surgical History:  Procedure Laterality Date   CHOLECYSTECTOMY     Patient Active Problem List   Diagnosis Date Noted   Abnormal uterine bleeding (AUB) 11/25/2019   Chest pain in adult 08/01/2017   Costochondritis 08/01/2017   Current severe episode of major depressive disorder without psychotic features without prior episode (HCC) 08/01/2017   Mild episode of recurrent major depressive disorder (HCC) 01/18/2017   Migraine 12/05/2015   Gastritis and gastroduodenitis 03/09/2015   Dyslipidemia 06/16/2013   Hypothyroidism 05/21/2013    PCP: Joaquin Mulberry, MD  REFERRING PROVIDER: Joaquin Mulberry, MD  REFERRING DIAG: M54.32 (ICD-10-CM) - Left sided sciatica  Rationale for Evaluation and Treatment: Rehabilitation  THERAPY DIAG:  Other low back pain  Muscle weakness (generalized)  Sciatica, left side  ONSET DATE: May 2024  SUBJECTIVE:                                                                                                                                                                                            SUBJECTIVE STATEMENT:  In person interpreter utilized: Arrives to OPPT with elevated L sided pain, onset after she awoke today.  Feels she may have overdone it at work.    PERTINENT HISTORY:  The patient, with a history of hypothyroidism, GERD presents with left-sided sciatica that has been ongoing since last May. She reports that the pain is severe enough to limit her ability to stand for more than  ten minutes at a time. She has been managing her pain without medication, as a previous prescription for gabapentin  did not provide relief. She also reports that she has been fasting on Mondays and Thursdays in an attempt to lose weight hoping it will be beneficial for her sciatica.  She denies currently taking paroxetine  even though her med list reveals it was last refilled in 02/2023, which was previously prescribed for hot flashes.  Endorses adherence with levothyroxine .  PAIN:  Are you having pain? Yes: NPRS scale: 6/10 Pain location: low back and LLE Pain description: ache, throb Aggravating factors: prolonged walking/standing Relieving factors: sitting/lying down  PRECAUTIONS: None  RED FLAGS: None   WEIGHT BEARING RESTRICTIONS: No  FALLS:  Has patient fallen in last 6 months? No    OCCUPATION: production  PLOF: Independent  PATIENT GOALS: To manage my back symptoms  NEXT MD VISIT: TBD  OBJECTIVE:  Note: Objective measures were completed at Evaluation unless otherwise noted.  DIAGNOSTIC FINDINGS:  None available  PATIENT SURVEYS:  Patient-specific activity scoring scheme (Point to one number):  "0" represents "unable to perform." "10" represents "able to perform at prior level. 0 1 2 3 4 5 6 7 8 9  10 (Date and Score) Activity Initial  Activity Eval     Prolonged standing  3    Prolonged walking  3    lifting 3    Additional Additional Total score = sum of the activity scores/number of  activities Minimum detectable change (90%CI) for average score = 2 points Minimum detectable change (90%CI) for single activity score = 3 points PSFS developed by: [place] Spitz., & Binkley, J. (1995). Assessing disability and change on individual  patients: a report of a patient specific measure. Physiotherapy [place], 47, [phone]. Reproduced with the permission of the authors  Score: 9/30 (30% functional)  MUSCLE LENGTH: Hamstrings: Right 70 deg; Left 45 deg  POSTURE: No Significant postural limitations  PALPATION: deferred  LUMBAR ROM:   AROM eval  Flexion 50%  Extension 10%  Right lateral flexion 50%  Left lateral flexion 50%  Right rotation   Left rotation    (Blank rows = not tested)  LOWER EXTREMITY ROM:   WFL for gait and transfers  Active  Right eval Left eval  Hip flexion    Hip extension    Hip abduction    Hip adduction    Hip internal rotation    Hip external rotation    Knee flexion    Knee extension    Ankle dorsiflexion    Ankle plantarflexion    Ankle inversion    Ankle eversion     (Blank rows = not tested)  LOWER EXTREMITY MMT:    MMT Right eval Left eval  Hip flexion    Hip extension    Hip abduction    Hip adduction    Hip internal rotation    Hip external rotation    Knee flexion    Knee extension    Ankle dorsiflexion    Ankle plantarflexion    Ankle inversion    Ankle eversion     (Blank rows = not tested)  LUMBAR SPECIAL TESTS:  Straight leg raise test: positive SI/piriformis pain and Slump test: positive L SI/piriformis pain  FUNCTIONAL TESTS:  30 seconds chair stand test 5 reps no UE support  GAIT: Distance walked: 25ftx2 Assistive device utilized: None Level of assistance: Complete Independence Comments: slightly antalgic  TREATMENT: [facility] Adult PT Treatment:  DATE: 06/24/23 Therapeutic Exercise: Nustep L4 8 min Seated hamstring stretch 30s x2  B QL stretch 30s x2 B Neuromuscular re-ed: P-ball curl ups 15x B, 15/15 unilaterally Supine hip fallouts BluTB 15x B, 15/15 unilaterally Supine bridge against BluTB 15x S/L clams BluTB 15/15 Therapeutic Activity: Bridge with ball 15x STS 10x w/OH reach  [facility] Adult PT Treatment:                                                DATE: 06/18/23 Therapeutic Exercise: Nustep L4 8 min Seated hamstring stretch 30s x2 B QL stretch 30s x2 B Neuromuscular re-ed: P-ball curl ups 10x B, 10/10 unilaterally Supine hip fallouts BluTB 10x B, 10/10 unilaterally Supine bridge against BluTB 10x S/L clams BluTB 10/10 Therapeutic Activity: STS 10x Sciatic tensioner L 10x  [facility] Adult PT Treatment:                                                DATE: 06/11/23 Therapeutic Exercise: Nustep L2 8 min Seated hamstring stretch 30s x2 B QL stretch 30s x2 B Neuromuscular re-ed: Supine hip fallouts GTB 15x B, 15/15 unilaterally Supine bridge against GTB 15x S/L clams GTB 15/15 Therapeutic Activity: STS 5x arms crossed   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 This included exercises focusing on stretching, strengthening, with focus on eccentric aspects. Long term goals include an improvement in range of motion, strength, endurance as well as avoiding reinjury. Patient's frequency would include in 1-2 times a day, 3-5 times a week for a duration of 6-12 weeks. Proper technique shown and discussed handout in great detail. All questions were discussed and addressed.                                                                                                                                 PATIENT EDUCATION:   Education details: Discussed eval findings, rehab rationale and POC and patient is in agreement  Person educated: Patient Education method: Explanation Education comprehension: verbalized understanding and needs further education  HOME EXERCISE PROGRAM: Access Code: [email or web address] URL: [email or web address] Date: 06/18/2023 Prepared by: Gretta Leavens  Exercises - Supine March  - 2 x daily -  5 x weekly - 2 sets - 10 reps - Supine 90/90 Abdominal Bracing  - 2 x daily - 5 x weekly - 1 sets - 2 reps - 30s hold - Sit to Stand with Arms Crossed  - 2 x daily - 5 x weekly - 1 sets - 1 reps - Supine Quadratus Lumborum Stretch  - 2 x daily - 5 x weekly - 1 sets - 2 reps - 30s hold - Curl Up with Reach  - 2 x daily - 5 x weekly - 1 sets - 10 reps  ASSESSMENT:  CLINICAL IMPRESSION: Arrives to PT with elevated symptoms without aggravating incident.  Patient awoke with symptoms following a normal shift at work.  Continued previous tasks to assess response of exercise.  Able to increase reps w/o any increase in symptoms.  Eval impression:Patient is a 53 y.o. female who was seen today for physical therapy evaluation and treatment for chronic low back pain. Patient presents with positive SLR and slump signs for sciatic irritation.  Weakness evident in 30s chair stand tests as well as with 90/90.  Piriformis irritation and mobility restrictions identified.    OBJECTIVE IMPAIRMENTS: Abnormal gait, decreased activity tolerance, decreased endurance, decreased knowledge of condition, decreased mobility, difficulty walking, decreased ROM, increased fascial restrictions, impaired perceived functional ability, improper body mechanics, obesity, and pain.   ACTIVITY LIMITATIONS: carrying, lifting, bending, sitting, standing, and stairs  PERSONAL FACTORS: Age, Fitness, Time since onset of injury/illness/exacerbation, and language barrier are also affecting patient's functional outcome.   REHAB  POTENTIAL: Fair based on chronicity and language barrier  CLINICAL DECISION MAKING: Evolving/moderate complexity  EVALUATION COMPLEXITY: Moderate   GOALS: Goals reviewed with patient? No    SHORT TERM GOALS=LONG TERM GOALS: Target date: 07/09/2023    Patient will acknowledge 4/10 pain at least once during episode of care   Baseline: 6/10 Goal status: INITIAL  2.  Patient will score at least 15/30 on PSFS to signify clinically meaningful improvement in functional abilities.   Baseline: 9/30 Goal status: INITIAL  3.  Patient will increase 30s chair stand reps from 5 to 8 without arms to demonstrate and improved functional ability with less pain/difficulty as well as reduce fall risk.  Baseline: 5 Goal status: INITIAL  4.  Patient to demonstrate independence in HEP  Baseline: [id number] Goal status: INITIAL    PLAN:  PT FREQUENCY: 1-2x/week  PT DURATION: 6 weeks  PLANNED INTERVENTIONS: 97164- PT Re-evaluation, 97110-Therapeutic exercises, 97530- Therapeutic activity, 97112- Neuromuscular re-education, 97535- Self Care, 21308- Manual therapy, and 97116- Gait training.  PLAN FOR NEXT SESSION: HEP review and update, manual techniques as appropriate, aerobic tasks, ROM and flexibility activities, strengthening and PREs, TPDN, gait and balance training as needed    For all possible CPT codes, reference the Planned Interventions line above.     Check all conditions that are expected to impact treatment: {Conditions expected to impact treatment:Morbid obesity, Contractures, spasticity or fracture relevant to requested treatment, and Social determinants of health   If treatment provided at initial evaluation, no treatment charged due to lack of authorization.       Jeff Berkeley Vanaken PT  06/24/2023, 2:00 PM

## 2023-06-24 ENCOUNTER — Ambulatory Visit

## 2023-06-24 DIAGNOSIS — M5459 Other low back pain: Secondary | ICD-10-CM

## 2023-06-24 DIAGNOSIS — M5432 Sciatica, left side: Secondary | ICD-10-CM

## 2023-06-24 DIAGNOSIS — M6281 Muscle weakness (generalized): Secondary | ICD-10-CM

## 2023-07-01 ENCOUNTER — Ambulatory Visit

## 2023-07-01 DIAGNOSIS — M6281 Muscle weakness (generalized): Secondary | ICD-10-CM

## 2023-07-01 DIAGNOSIS — M5459 Other low back pain: Secondary | ICD-10-CM | POA: Diagnosis not present

## 2023-07-01 DIAGNOSIS — M5432 Sciatica, left side: Secondary | ICD-10-CM

## 2023-07-01 NOTE — Therapy (Signed)
 OUTPATIENT PHYSICAL THERAPY TREATMENT NOTE   Patient Name: Kaitlyn Wise MRN: [id number] DOB:31-Jul-1970, 53 y.o., female Today's Date: 07/01/2023  END OF SESSION:  PT End of Session - 07/01/23 1319     Visit Number 6    Number of Visits 6    Date for PT Re-Evaluation 07/14/23    Authorization Type [employer]    Authorization Time Period Approved 12 visits 05/14/23-07/02/23    Authorization - Visit Number 6    Authorization - Number of Visits 12    PT Start Time 1315    PT Stop Time 1355    PT Time Calculation (min) 40 min    Activity Tolerance Patient tolerated treatment well;Patient limited by pain    Behavior During Therapy [facility] for tasks assessed/performed               Past Medical History:  Diagnosis Date   Allergy    Anemia    Anxiety    Arthritis    Depression    Dyslipidemia    GERD (gastroesophageal reflux disease)    Thyroid  disease    Uterine fibroid    Vertigo    Past Surgical History:  Procedure Laterality Date   CHOLECYSTECTOMY     Patient Active Problem List   Diagnosis Date Noted   Abnormal uterine bleeding (AUB) 11/25/2019   Chest pain in adult 08/01/2017   Costochondritis 08/01/2017   Current severe episode of major depressive disorder without psychotic features without prior episode (HCC) 08/01/2017   Mild episode of recurrent major depressive disorder (HCC) 01/18/2017   Migraine 12/05/2015   Gastritis and gastroduodenitis 03/09/2015   Dyslipidemia 06/16/2013   Hypothyroidism 05/21/2013    PCP: Joaquin Mulberry, MD  REFERRING PROVIDER: Joaquin Mulberry, MD  REFERRING DIAG: M54.32 (ICD-10-CM) - Left sided sciatica  Rationale for Evaluation and Treatment: Rehabilitation  THERAPY DIAG:  Other low back pain  Muscle weakness (generalized)  Sciatica, left side  ONSET DATE: May 2024  SUBJECTIVE:                                                                                                                                                                                            SUBJECTIVE STATEMENT:  In person interpreter utilized: patient continues to note pain levels in L gluteal region 3-9/10 in intensity.    PERTINENT HISTORY:  The patient, with a history of hypothyroidism, GERD presents with left-sided sciatica that has been ongoing since last May. She reports that the pain is severe enough to limit her ability to stand for more than ten minutes at a time. She has been managing her pain  without medication, as a previous prescription for gabapentin  did not provide relief. She also reports that she has been fasting on Mondays and Thursdays in an attempt to lose weight hoping it will be beneficial for her sciatica.  She denies currently taking paroxetine  even though her med list reveals it was last refilled in 02/2023, which was previously prescribed for hot flashes.  Endorses adherence with levothyroxine .  PAIN:  Are you having pain? Yes: NPRS scale: 6/10 Pain location: low back and LLE Pain description: ache, throb Aggravating factors: prolonged walking/standing Relieving factors: sitting/lying down  PRECAUTIONS: None  RED FLAGS: None   WEIGHT BEARING RESTRICTIONS: No  FALLS:  Has patient fallen in last 6 months? No    OCCUPATION: production  PLOF: Independent  PATIENT GOALS: To manage my back symptoms  NEXT MD VISIT: TBD  OBJECTIVE:  Note: Objective measures were completed at Evaluation unless otherwise noted.  DIAGNOSTIC FINDINGS:  None available  PATIENT SURVEYS:  Patient-specific activity scoring scheme (Point to one number):  0 represents "unable to perform." 10 represents "able to perform at prior level. 0 1 2 3 4 5 6 7 8 9  10 (Date and Score) Activity Initial  Activity Eval   07/01/23  Prolonged standing  3 3   Prolonged walking  3  3  lifting 3 2   Additional Additional Total score = sum of the activity scores/number of activities Minimum detectable  change (90%CI) for average score = 2 points Minimum detectable change (90%CI) for single activity score = 3 points PSFS developed by: [place] Spitz., & Binkley, J. (1995). Assessing disability and change on individual  patients: a report of a patient specific measure. Physiotherapy [place], 47, [phone]. Reproduced with the permission of the authors  Score: 9/30 (30% functional)  MUSCLE LENGTH: Hamstrings: Right 70 deg; Left 45 deg  POSTURE: No Significant postural limitations  PALPATION: deferred  LUMBAR ROM:   AROM eval  Flexion 50%  Extension 10%  Right lateral flexion 50%  Left lateral flexion 50%  Right rotation   Left rotation    (Blank rows = not tested)  LOWER EXTREMITY ROM:   WFL for gait and transfers  Active  Right eval Left eval  Hip flexion    Hip extension    Hip abduction    Hip adduction    Hip internal rotation    Hip external rotation    Knee flexion    Knee extension    Ankle dorsiflexion    Ankle plantarflexion    Ankle inversion    Ankle eversion     (Blank rows = not tested)  LOWER EXTREMITY MMT:    MMT Right eval Left eval  Hip flexion    Hip extension    Hip abduction    Hip adduction    Hip internal rotation    Hip external rotation    Knee flexion    Knee extension    Ankle dorsiflexion    Ankle plantarflexion    Ankle inversion    Ankle eversion     (Blank rows = not tested)  LUMBAR SPECIAL TESTS:  Straight leg raise test: positive SI/piriformis pain and Slump test: positive L SI/piriformis pain  FUNCTIONAL TESTS:  30 seconds chair stand test 5 reps no UE support 07/01/23 8 reps  GAIT: Distance walked: 57ftx2 Assistive device utilized: None Level of assistance: Complete Independence Comments: slightly antalgic  TREATMENT: [facility] Adult PT Treatment:  DATE: 07/01/23 Therapeutic Exercise: Nustep L4 8 min Seated hamstring stretch 30s x2 B QL stretch  30s x2 B SKTC B 30s x2  Therapeutic Activity: Assessment of progress towards goals [facility] Adult PT Treatment:                                                DATE: 06/24/23 Therapeutic Exercise: Nustep L4 8 min Seated hamstring stretch 30s x2 B QL stretch 30s x2 B Neuromuscular re-ed: P-ball curl ups 15x B, 15/15 unilaterally Supine hip fallouts BluTB 15x B, 15/15 unilaterally Supine bridge against BluTB 15x S/L clams BluTB 15/15 Therapeutic Activity: Bridge with ball 15x STS 10x w/OH reach  [facility] Adult PT Treatment:                                                DATE: 06/18/23 Therapeutic Exercise: Nustep L4 8 min Seated hamstring stretch 30s x2 B QL stretch 30s x2 B Neuromuscular re-ed: P-ball curl ups 10x B, 10/10 unilaterally Supine hip fallouts BluTB 10x B, 10/10 unilaterally Supine bridge against BluTB 10x S/L clams BluTB 10/10 Therapeutic Activity: STS 10x Sciatic tensioner L 10x  [facility] Adult PT Treatment:                                                DATE: 06/11/23 Therapeutic Exercise: Nustep L2 8 min Seated hamstring stretch 30s x2 B QL stretch 30s x2 B Neuromuscular re-ed: Supine hip fallouts GTB 15x B, 15/15 unilaterally Supine bridge against GTB 15x S/L clams GTB 15/15 Therapeutic Activity: STS 5x arms crossed   Pt required extra time for exercise performance to teach technique  [facility] Adult PT Treatment:                                                DATE: 05/14/23 Eval and HEP Self Care: Additional minutes spent for educating on updated Therapeutic Home Exercise Program as well as comparing current status to condition at start of symptoms. This included exercises focusing on stretching, strengthening, with focus on eccentric aspects. Long term goals include an improvement in range of motion, strength, endurance as well as avoiding reinjury. Patient's frequency would include in 1-2 times a day, 3-5 times a week for a duration of 6-12 weeks. Proper technique  shown and discussed handout in great detail. All questions were discussed and addressed.  PATIENT EDUCATION:  Education details: Discussed eval findings, rehab rationale and POC and patient is in agreement  Person educated: Patient Education method: Explanation Education comprehension: verbalized understanding and needs further education  HOME EXERCISE PROGRAM: Access Code: [email or web address] URL: [email or web address] Date: 06/18/2023 Prepared by: Jaya Lapka  Exercises - Supine March  - 2 x daily - 5 x weekly - 2 sets - 10 reps - Supine 90/90 Abdominal Bracing  - 2 x daily - 5 x weekly - 1 sets - 2 reps - 30s hold - Sit to Stand with Arms Crossed  - 2 x daily - 5 x weekly - 1 sets - 1 reps - Supine Quadratus Lumborum Stretch  - 2 x daily - 5 x weekly - 1 sets - 2 reps - 30s hold - Curl Up with Reach  - 2 x daily - 5 x weekly - 1 sets - 10 reps  ASSESSMENT:  CLINICAL IMPRESSION: Despite continued pain levels, goals met regarding strength and function.  Patient is independent in HEP and no further OPPT recommended.  Patient advised to f/u with referring MD for ongoing care.    Eval impression:Patient is a 53 y.o. female who was seen today for physical therapy evaluation and treatment for chronic low back pain. Patient presents with positive SLR and slump signs for sciatic irritation.  Weakness evident in 30s chair stand tests as well as with 90/90.  Piriformis irritation and mobility restrictions identified.    OBJECTIVE IMPAIRMENTS: Abnormal gait, decreased activity tolerance, decreased endurance, decreased knowledge of condition, decreased mobility, difficulty walking, decreased ROM, increased fascial restrictions, impaired perceived functional ability, improper body mechanics, obesity, and pain.   ACTIVITY LIMITATIONS: carrying, lifting, bending,  sitting, standing, and stairs  PERSONAL FACTORS: Age, Fitness, Time since onset of injury/illness/exacerbation, and language barrier are also affecting patient's functional outcome.   REHAB POTENTIAL: Fair based on chronicity and language barrier  CLINICAL DECISION MAKING: Evolving/moderate complexity  EVALUATION COMPLEXITY: Moderate   GOALS: Goals reviewed with patient? No    SHORT TERM GOALS=LONG TERM GOALS: Target date: 07/09/2023    Patient will acknowledge 4/10 pain at least once during episode of care   Baseline: 6/10; 07/01/23 3-9/10 Goal status: Met  2.  Patient will score at least 15/30 on PSFS to signify clinically meaningful improvement in functional abilities.   Baseline: 9/30; 8/30 Goal status: Not met  3.  Patient will increase 30s chair stand reps from 5 to 8 without arms to demonstrate and improved functional ability with less pain/difficulty as well as reduce fall risk.  Baseline: 5; 07/01/23 8 Goal status: Met  4.  Patient to demonstrate independence in HEP  Baseline: [id number] Goal status: MET    PLAN:  PT FREQUENCY: 1-2x/week  PT DURATION: 6 weeks  PLANNED INTERVENTIONS: 97164- PT Re-evaluation, 97110-Therapeutic exercises, 97530- Therapeutic activity, 97112- Neuromuscular re-education, 97535- Self Care, 16109- Manual therapy, and 97116- Gait training.  PLAN FOR NEXT SESSION: HEP review and update, manual techniques as appropriate, aerobic tasks, ROM and flexibility activities, strengthening and PREs, TPDN, gait and balance training as needed    For all possible CPT codes, reference the Planned Interventions line above.     Check all conditions that are expected to impact treatment: {Conditions expected to impact treatment:Morbid obesity, Contractures, spasticity or fracture relevant to requested treatment, and Social determinants of health   If treatment provided at initial evaluation, no treatment charged due to lack of authorization.       Jeff  Jp Eastham PT  07/01/2023,  2:07 PM

## 2023-07-17 ENCOUNTER — Other Ambulatory Visit: Payer: Self-pay

## 2023-08-20 ENCOUNTER — Other Ambulatory Visit: Payer: Self-pay

## 2023-08-21 ENCOUNTER — Other Ambulatory Visit: Payer: Self-pay

## 2023-09-17 ENCOUNTER — Emergency Department (HOSPITAL_COMMUNITY)

## 2023-09-17 ENCOUNTER — Encounter (HOSPITAL_COMMUNITY): Payer: Self-pay | Admitting: Radiology

## 2023-09-17 ENCOUNTER — Other Ambulatory Visit: Payer: Self-pay

## 2023-09-17 ENCOUNTER — Emergency Department (HOSPITAL_COMMUNITY)
Admission: EM | Admit: 2023-09-17 | Discharge: 2023-09-17 | Disposition: A | Discharge status: Home / Self Care | Attending: Emergency Medicine | Admitting: Emergency Medicine

## 2023-09-17 DIAGNOSIS — R0789 Other chest pain: Secondary | ICD-10-CM | POA: Insufficient documentation

## 2023-09-17 DIAGNOSIS — R059 Cough, unspecified: Secondary | ICD-10-CM | POA: Insufficient documentation

## 2023-09-17 LAB — BASIC METABOLIC PANEL WITH GFR
Anion gap: 13 (ref 5–15)
BUN: 16 mg/dL (ref 6–20)
CO2: 23 mmol/L (ref 22–32)
Calcium: 9.3 mg/dL (ref 8.9–10.3)
Chloride: 104 mmol/L (ref 98–111)
Creatinine, Ser: 0.9 mg/dL (ref 0.44–1.00)
GFR, Estimated: >60 mL/min (ref >60)
Glucose, Bld: 105 mg/dL — ABNORMAL HIGH (ref 70–99)
Potassium: 4.3 mmol/L (ref 3.5–5.1)
Sodium: 140 mmol/L (ref 135–145)

## 2023-09-17 LAB — CBC
HCT: 42 % (ref 36.0–46.0)
Hemoglobin: 13.4 g/dL (ref 12.0–15.0)
MCH: 28.2 pg (ref 26.0–34.0)
MCHC: 31.9 g/dL (ref 30.0–36.0)
MCV: 88.4 fL (ref 80.0–100.0)
Platelets: 242 K/uL (ref 150–400)
RBC: 4.75 MIL/uL (ref 3.87–5.11)
RDW: 14 % (ref 11.5–15.5)
WBC: 6.1 K/uL (ref 4.0–10.5)
nRBC: 0 % (ref 0.0–0.2)

## 2023-09-17 LAB — RESP PANEL BY RT-PCR (RSV, FLU A&B, COVID)  RVPGX2
Influenza A by PCR: NEGATIVE
Influenza B by PCR: NEGATIVE
Resp Syncytial Virus by PCR: NEGATIVE
SARS Coronavirus 2 by RT PCR: NEGATIVE

## 2023-09-17 LAB — TROPONIN I (HIGH SENSITIVITY)
Troponin I (High Sensitivity): 3 ng/L (ref <18)
Troponin I (High Sensitivity): 4 ng/L (ref <18)

## 2023-09-17 MED ORDER — CYCLOBENZAPRINE HCL 10 MG PO TABS
10.0000 mg | ORAL_TABLET | Freq: Three times a day (TID) | ORAL | 0 refills | Status: DC | PRN
  Filled 2023-09-17: qty 10, 4d supply, fill #0

## 2023-09-17 NOTE — ED Triage Notes (Signed)
 Pt states that yesterday she began having left side chest pain and today she was unable to take a shower due to the pain in her chest.  She has taken 250mg  tylenol  without relief.

## 2023-09-17 NOTE — ED Provider Notes (Signed)
 [facility]PT [facility] Emergency Department Provider Note MRN:  [id number]  Arrival date & time: 09/17/23     Chief Complaint   Chest Pain   History of Present Illness   Kaitlyn Wise is a 53 y.o. year-old female presents to the ED with chief complaint of chest pain. Onset was yesterday afternoon before work.  Radiates to the left arm.  Has tried taking 1/2 and aspirin with some relief.  States that the pain worsened this morning.  Normally works on night shift.  After she woke up this afternoon, she was still having the pain.  Denies any injuries.  States that she has been having a bad cough. States that she has been coughing for about 3 days.    States that she has increased pain with moving her left arm and shoulder.  Moving the shoulder and arm reproduces the pain.  Hx of costochondritis.  Denies hx of heart or lung problems.   History provided by patient. Professional Arabic interpreter used through [employer]. #[id number]  Review of Systems  Pertinent positive and negative review of systems noted in HPI.    Physical Exam   Vitals:   09/17/23 0129  BP: 123/61  Pulse: 72  Resp: 16  Temp: 98.4 F (36.9 C)  SpO2: 100%    CONSTITUTIONAL:  non toxic-appearing, NAD NEURO:  Alert and oriented x 3, CN 3-12 grossly intact EYES:  eyes equal and reactive ENT/NECK:  Supple, no stridor  CARDIO:  normal rate, regular rhythm, appears well-perfused, intact distal pulses, left anterior chest wall tenderness, worse with ROM of left arm PULM:  No respiratory distress, CTAB GI/GU:  non-distended, no focal abdominal tenderness MSK/SPINE:  No gross deformities, no edema, moves all extremities  SKIN:  no rash, atraumatic   *Additional and/or pertinent findings included in MDM below  Diagnostic and Interventional Summary    EKG Interpretation Date/Time:  Tuesday September 17 2023 01:33:04 EDT Ventricular Rate:  71 PR Interval:  142 QRS  Duration:  78 QT Interval:  400 QTC Calculation: 434 R Axis:   51  Text Interpretation: Normal sinus rhythm Low voltage QRS Cannot rule out Anterior infarct , age undetermined Abnormal ECG No significant change since last tracing Confirmed by Midge Golas ([id number]) on 09/17/2023 1:45:57 AM       Labs Reviewed  BASIC METABOLIC PANEL WITH GFR - Abnormal; Notable for the following components:      Result Value   Glucose, Bld 105 (*)    All other components within normal limits  RESP PANEL BY RT-PCR (RSV, FLU A&B, COVID)  [id number]  CBC  TROPONIN I (HIGH SENSITIVITY)  TROPONIN I (HIGH SENSITIVITY)    DG Shoulder Left  Final Result    DG Chest 2 View  Final Result      Medications - No data to display   Procedures  /  Critical Care Procedures  ED Course and Medical Decision Making  I have reviewed the triage vital signs, the nursing notes, and pertinent available records from the EMR.  Social Determinants Affecting Complexity of Care: Patient has no clinically significant social determinants affecting this chief complaint..   ED Course:    Medical Decision Making Patient here with left-sided chest pain that began a little over 24 hours ago.  She states that she noticed it yesterday before work.  It is worsened with palpation and movement, especially with movement of the left arm and shoulder.  It is constant.  She has tried  taking aspirin with mild improvement.  She denies history of heart or lung problems.  She does have documented history of costochondritis.  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  On my exam she does have the tenderness to the left anterior chest wall which is reproducible.  She has intact distal pulses.  Her strength and sensation are intact.  Her symptoms are worsened with chest flexion.  She did not allow me to examine the skin, but she said that she does not have a  rash and verified this while I looked away.  Labs are in process.  EKG shows no acute ischemic changes, no significant change since last EKG.  5:02 AM Repeat troponin is negative.  Patient looks well.  Vital signs are stable.  Labs and imaging are all reassuring.  Pain is reproducible with palpation and movement.  I suspect that this is musculoskeletal and will treat with a muscle relaxer.  She is advised to use ice and heat as needed and to follow-up with PCP.  She is advised to return for new or worsening symptoms.  Amount and/or Complexity of Data Reviewed Labs: ordered. Radiology: ordered.  Risk Prescription drug management.         Consultants: No consultations were needed in caring for this patient.   Treatment and Plan: I considered admission due to patient's initial presentation, but after considering the examination and diagnostic results, patient will not require admission and can be discharged with outpatient follow-up.    Final Clinical Impressions(s) / ED Diagnoses     ICD-10-CM   1. Chest wall pain  R07.89       ED Discharge Orders          Ordered    cyclobenzaprine  (FLEXERIL ) 10 MG tablet  3 times daily PRN        09/17/23 0502              Discharge Instructions Discussed with and Provided to Patient:   Discharge Instructions   None      Vicky Charleston, PA-C 09/17/23 0503    Midge Golas, MD 09/17/23 [phone]

## 2023-10-23 ENCOUNTER — Other Ambulatory Visit: Payer: Self-pay

## 2023-11-05 ENCOUNTER — Encounter: Payer: Self-pay | Admitting: Family Medicine

## 2023-11-05 ENCOUNTER — Ambulatory Visit: Attending: Family Medicine | Admitting: Family Medicine

## 2023-11-05 VITALS — BP 101/65 | HR 74 | Temp 97.8°F | Ht 62.0 in | Wt 162.8 lb

## 2023-11-05 DIAGNOSIS — E038 Other specified hypothyroidism: Secondary | ICD-10-CM

## 2023-11-05 DIAGNOSIS — K219 Gastro-esophageal reflux disease without esophagitis: Secondary | ICD-10-CM

## 2023-11-05 DIAGNOSIS — Z1159 Encounter for screening for other viral diseases: Secondary | ICD-10-CM | POA: Diagnosis not present

## 2023-11-05 DIAGNOSIS — L659 Nonscarring hair loss, unspecified: Secondary | ICD-10-CM

## 2023-11-05 NOTE — Patient Instructions (Signed)
 [facility]-[phone]

## 2023-11-05 NOTE — Progress Notes (Signed)
 Subjective:  Patient ID: Kaitlyn Wise, female    DOB: May 04, 1970  Age: 53 y.o. MRN: [id number]  CC: Medical Management of Chronic Issues     Discussed the use of AI scribe software for clinical note transcription with the patient, who gave verbal consent to proceed.  History of Present Illness Kaitlyn Wise is a 53 year old female with  a history of hypothyroidism, GERD  who presents with concerns about increased appetite and hair loss.  She is on medication for her thyroid  condition, with her last thyroid  function test in April showing normal results. She is due for another blood test today. Over the past three months, she has experienced an increase in appetite despite avoiding bread, fries, and fatty foods. Her weight has decreased from 179 pounds in January to 162 pounds currently. She attributes this weight loss to changes in her eating habits over the past year, including fasting twice a week and avoiding certain foods, although her portion sizes have increased. She experiences hair loss.   She has not been taking any regular medication for acid reflux but uses it as needed when symptoms are severe. She experiences occasional upper abdominal pain. She works three days a week for twelve hours each day, remaining active for about two hours during her shifts. She wants to lose more weight and sometimes goes for walks.    Past Medical History:  Diagnosis Date   Allergy    Anemia    Anxiety    Arthritis    Depression    Dyslipidemia    GERD (gastroesophageal reflux disease)    Thyroid  disease    Uterine fibroid    Vertigo     Past Surgical History:  Procedure Laterality Date   CHOLECYSTECTOMY      Family History  Problem Relation Age of Onset   Diabetes Mother    Hypertension Mother    Diabetes Father    Hypertension Father    Heart disease Brother    Colon cancer Neg Hx    Esophageal cancer Neg Hx    Stomach cancer Neg Hx     Social  History   Socioeconomic History   Marital status: Widowed    Spouse name: Not on file   Number of children: Not on file   Years of education: Not on file   Highest education level: Not on file  Occupational History   Not on file  Tobacco Use   Smoking status: Never   Smokeless tobacco: Never  Vaping Use   Vaping status: Never Used  Substance and Sexual Activity   Alcohol use: No   Drug use: No   Sexual activity: Not Currently  Other Topics Concern   Not on file  Social History Narrative   Not on file   Social Drivers of Health   Financial Resource Strain: Not on file  Food Insecurity: Food Insecurity Present (02/01/2023)   Hunger Vital Sign    Worried About Running Out of Food in the Last Year: Often true    Ran Out of Food in the Last Year: Sometimes true  Transportation Needs: No Transportation Needs (02/01/2023)   PRAPARE - Administrator, Civil Service (Medical): No    Lack of Transportation (Non-Medical): No  Physical Activity: Inactive (02/01/2023)   Exercise Vital Sign    Days of Exercise per Week: 0 days    Minutes of Exercise per Session: 0 min  Stress: Not on file  Social  Connections: Socially Isolated (02/01/2023)   Social Connection and Isolation Panel    Frequency of Communication with Friends and Family: Three times a week    Frequency of Social Gatherings with Friends and Family: Twice a week    Attends Religious Services: Never    Database administrator or Organizations: No    Attends Banker Meetings: Never    Marital Status: Widowed    Allergies  Allergen Reactions   Egg Protein-Containing Drug Products    Fish Allergy Itching   Milk-Related Compounds Diarrhea and Itching    Outpatient Medications Prior to Visit  Medication Sig Dispense Refill   levothyroxine  (SYNTHROID ) 88 MCG tablet Take 1 tablet (88 mcg total) by mouth daily. DOSE CHANGE 90 tablet 1   pantoprazole  (PROTONIX ) 40 MG tablet Take 1 tablet (40 mg total)  by mouth daily. 90 tablet 1   cyclobenzaprine  (FLEXERIL ) 10 MG tablet Take 1 tablet (10 mg total) by mouth 3 (three) times daily as needed for muscle spasms. (Patient not taking: Reported on 11/05/2023) 10 tablet 0   gabapentin  (NEURONTIN ) 300 MG capsule Take 1 capsule (300 mg total) by mouth 2 (two) times daily. For back pain (Patient not taking: Reported on 11/05/2023) 60 capsule 3   NEOMYCIN -POLYMYXIN-HYDROCORTISONE  (CORTISPORIN ) 1 % SOLN OTIC solution Place 3 drops into the left ear 4 (four) times daily. (Patient not taking: Reported on 11/05/2023) 10 mL 0   PARoxetine  (PAXIL ) 10 MG tablet Take 1 tablet (10 mg total) by mouth daily. For hot flashes (Patient not taking: Reported on 11/05/2023) 90 tablet 1   No facility-administered medications prior to visit.     ROS Review of Systems  Constitutional:  Negative for activity change and appetite change.  HENT:  Negative for sinus pressure and sore throat.   Respiratory:  Negative for chest tightness, shortness of breath and wheezing.   Cardiovascular:  Negative for chest pain and palpitations.  Gastrointestinal:  Negative for abdominal distention, abdominal pain and constipation.  Genitourinary: Negative.   Musculoskeletal: Negative.   Psychiatric/Behavioral:  Negative for behavioral problems and dysphoric mood.     Objective:  BP 101/65   Pulse 74   Temp 97.8 F (36.6 C) (Oral)   Ht 5' 2 (1.575 m)   Wt 162 lb 12.8 oz (73.8 kg)   LMP 03/15/2021 (Approximate)   SpO2 97%   BMI 29.78 kg/m      11/05/2023    1:39 PM 09/17/2023    1:29 AM 09/17/2023    1:23 AM  BP/Weight  Systolic BP 101 123   Diastolic BP 65 61   Wt. (Lbs) 162.8  164  BMI 29.78 kg/m2  30 kg/m2      Physical Exam Constitutional:      Appearance: She is well-developed.  Cardiovascular:     Rate and Rhythm: Normal rate.     Heart sounds: Normal heart sounds. No murmur heard. Pulmonary:     Effort: Pulmonary effort is normal.     Breath sounds: Normal  breath sounds. No wheezing or rales.  Chest:     Chest wall: No tenderness.  Abdominal:     General: Bowel sounds are normal. There is no distension.     Palpations: Abdomen is soft. There is no mass.     Tenderness: There is no abdominal tenderness.  Musculoskeletal:        General: Normal range of motion.     Right lower leg: No edema.     Left lower  leg: No edema.  Neurological:     Mental Status: She is alert and oriented to person, place, and time.  Psychiatric:        Mood and Affect: Mood normal.        Latest Ref Rng & Units 09/17/2023    1:32 AM 02/01/2023    3:55 PM 03/28/2022    3:20 PM  CMP  Glucose 70 - 99 mg/dL 894  92  90   BUN 6 - 20 mg/dL 16  19  15    Creatinine 0.44 - 1.00 mg/dL 9.09  9.28  9.21   Sodium 135 - 145 mmol/L 140  139  141   Potassium 3.5 - 5.1 mmol/L 4.3  4.5  4.8   Chloride 98 - 111 mmol/L 104  102  104   CO2 22 - 32 mmol/L 23  24  25    Calcium  8.9 - 10.3 mg/dL 9.3  89.8  89.5   Total Protein 6.0 - 8.5 g/dL  7.3  7.4   Total Bilirubin 0.0 - 1.2 mg/dL  0.2  0.3   Alkaline Phos 44 - 121 IU/L  164  144   AST 0 - 40 IU/L  22  24   ALT 0 - 32 IU/L  22  21     Lipid Panel     Component Value Date/Time   CHOL 203 (H) 03/28/2022 1520   TRIG 107 03/28/2022 1520   HDL 57 03/28/2022 1520   CHOLHDL 3.8 09/12/2020 0945   CHOLHDL 2.9 08/25/2015 1111   VLDL 17 08/25/2015 1111   LDLCALC 127 (H) 03/28/2022 1520    CBC    Component Value Date/Time   WBC 6.1 09/17/2023 0132   RBC 4.75 09/17/2023 0132   HGB 13.4 09/17/2023 0132   HGB 13.7 03/28/2022 1520   HCT 42.0 09/17/2023 0132   HCT 42.4 03/28/2022 1520   PLT 242 09/17/2023 0132   PLT 307 03/28/2022 1520   MCV 88.4 09/17/2023 0132   MCV 85 03/28/2022 1520   MCH 28.2 09/17/2023 0132   MCHC 31.9 09/17/2023 0132   RDW 14.0 09/17/2023 0132   RDW 13.8 03/28/2022 1520   LYMPHSABS 2.3 03/28/2022 1520   MONOABS 0.4 11/29/2021 0046   EOSABS 0.4 03/28/2022 1520   BASOSABS 0.1 03/28/2022 1520     Lab Results  Component Value Date   HGBA1C 5.8 (H) 03/28/2022    Lab Results  Component Value Date   TSH 2.670 05/06/2023       Assessment & Plan Other specified hypothyroidism/ hair loss Hypothyroidism managed with levothyroxine . Recent thyroid  function tests normal. Increased appetite and hair loss noted. Weight loss from 179 lbs to 162 lbs despite increased appetite. - Order thyroid  function tests to reassess thyroid  levels.  Gastroesophageal reflux disease Intermittent GERD managed with pantoprazole  as needed. Occasional upper stomach pain attributed to gastritis.  General Health Maintenance/ screening for breast cancer/ screening for viral disease Good general health. Working on [employer] through dietary changes. Insurance issue for mammogram needs resolution. - Provide number to [facility] for mammogram scheduling as [facility] not [facility] her [facility]. - Advise substituting larger food portions with vegetables and fruits for weight management. -Screen for immunity to Hep B     No orders of the defined types were placed in this encounter.   Follow-up: Return in about 6 months (around 05/05/2024) for Chronic medical conditions.       Corrina Sabin, MD, FAAFP. [facility] [place], [place] [phone]   11/05/2023, 2:16 PM

## 2023-11-06 ENCOUNTER — Other Ambulatory Visit: Payer: Self-pay

## 2023-11-06 ENCOUNTER — Ambulatory Visit: Payer: Self-pay | Admitting: Family Medicine

## 2023-11-06 DIAGNOSIS — E038 Other specified hypothyroidism: Secondary | ICD-10-CM

## 2023-11-06 DIAGNOSIS — E875 Hyperkalemia: Secondary | ICD-10-CM

## 2023-11-06 LAB — T4, FREE: Free T4: 1.21 ng/dL (ref 0.82–1.77)

## 2023-11-06 LAB — BASIC METABOLIC PANEL WITH GFR
BUN/Creatinine Ratio: 17 (ref 9–23)
BUN: 12 mg/dL (ref 6–24)
CO2: 24 mmol/L (ref 20–29)
Calcium: 9.7 mg/dL (ref 8.7–10.2)
Chloride: 104 mmol/L (ref 96–106)
Creatinine, Ser: 0.71 mg/dL (ref 0.57–1.00)
Glucose: 99 mg/dL (ref 70–99)
Potassium: 5.3 mmol/L — ABNORMAL HIGH (ref 3.5–5.2)
Sodium: 141 mmol/L (ref 134–144)
eGFR: 103 mL/min/1.73 (ref >59)

## 2023-11-06 LAB — TSH: TSH: 12.1 u[IU]/mL — ABNORMAL HIGH (ref 0.450–4.500)

## 2023-11-06 LAB — HEPATITIS B SURFACE ANTIBODY, QUANTITATIVE: Hepatitis B Surf Ab Quant: 612 m[IU]/mL

## 2023-11-06 LAB — T3: T3, Total: 86 ng/dL (ref 71–180)

## 2023-11-06 MED ORDER — LEVOTHYROXINE SODIUM 100 MCG PO TABS
100.0000 ug | ORAL_TABLET | Freq: Every day | ORAL | 1 refills | Status: AC
  Filled 2023-11-06: qty 90, 90d supply, fill #0

## 2023-11-08 ENCOUNTER — Other Ambulatory Visit: Payer: Self-pay

## 2024-02-04 ENCOUNTER — Telehealth: Payer: Self-pay

## 2024-02-04 NOTE — Telephone Encounter (Signed)
 MM results are uploaded to patient chart.  Under encounters dated 11/25/23

## 2024-02-04 NOTE — Telephone Encounter (Signed)
 Please inform her that her mammogram is negative for malignancy.  Next mammogram is recommended in 1 year.  Thank you.

## 2024-02-04 NOTE — Telephone Encounter (Signed)
 Patient has been informed of results

## 2024-05-05 ENCOUNTER — Ambulatory Visit: Admitting: Family Medicine
